# Patient Record
Sex: Male | Born: 1955 | Race: White | Hispanic: No | Smoking: Former smoker
Health system: Southern US, Community
[De-identification: ages and names within clinical notes are randomized; demographics above are authoritative.]

## PROBLEM LIST (undated history)

## (undated) DIAGNOSIS — I493 Ventricular premature depolarization: Secondary | ICD-10-CM

## (undated) DIAGNOSIS — I10 Essential (primary) hypertension: Secondary | ICD-10-CM

## (undated) DIAGNOSIS — J449 Chronic obstructive pulmonary disease, unspecified: Secondary | ICD-10-CM

## (undated) DIAGNOSIS — E785 Hyperlipidemia, unspecified: Secondary | ICD-10-CM

## (undated) DIAGNOSIS — M1990 Unspecified osteoarthritis, unspecified site: Secondary | ICD-10-CM

## (undated) DIAGNOSIS — N189 Chronic kidney disease, unspecified: Secondary | ICD-10-CM

## (undated) DIAGNOSIS — R0789 Other chest pain: Secondary | ICD-10-CM

## (undated) DIAGNOSIS — K219 Gastro-esophageal reflux disease without esophagitis: Secondary | ICD-10-CM

## (undated) DIAGNOSIS — Z720 Tobacco use: Secondary | ICD-10-CM

## (undated) DIAGNOSIS — R0600 Dyspnea, unspecified: Secondary | ICD-10-CM

## (undated) DIAGNOSIS — K2100 Gastro-esophageal reflux disease with esophagitis, without bleeding: Secondary | ICD-10-CM

## (undated) HISTORY — DX: Chronic kidney disease, unspecified: N18.9

## (undated) HISTORY — DX: Essential (primary) hypertension: I10

## (undated) HISTORY — DX: Gastro-esophageal reflux disease without esophagitis: K21.9

## (undated) HISTORY — DX: Unspecified osteoarthritis, unspecified site: M19.90

## (undated) HISTORY — DX: Dyspnea, unspecified: R06.00

## (undated) HISTORY — DX: Ventricular premature depolarization: I49.3

## (undated) HISTORY — DX: Tobacco use: Z72.0

## (undated) HISTORY — DX: Hyperlipidemia, unspecified: E78.5

## (undated) HISTORY — DX: Other chest pain: R07.89

## (undated) HISTORY — PX: OTHER SURGICAL HISTORY: SHX169

## (undated) HISTORY — DX: Gastro-esophageal reflux disease with esophagitis, without bleeding: K21.00

---

## 2002-01-18 ENCOUNTER — Emergency Department (HOSPITAL_COMMUNITY): Admission: EM | Admit: 2002-01-18 | Discharge: 2002-01-18 | Payer: Self-pay | Admitting: *Deleted

## 2002-01-18 ENCOUNTER — Encounter: Payer: Self-pay | Admitting: *Deleted

## 2006-08-26 ENCOUNTER — Emergency Department (HOSPITAL_COMMUNITY): Admission: EM | Admit: 2006-08-26 | Discharge: 2006-08-26 | Payer: Self-pay | Admitting: Emergency Medicine

## 2007-06-29 ENCOUNTER — Emergency Department (HOSPITAL_COMMUNITY): Admission: EM | Admit: 2007-06-29 | Discharge: 2007-06-29 | Payer: Self-pay | Admitting: Emergency Medicine

## 2011-01-12 ENCOUNTER — Encounter (INDEPENDENT_AMBULATORY_CARE_PROVIDER_SITE_OTHER): Payer: Self-pay | Admitting: *Deleted

## 2011-01-12 LAB — CONVERTED CEMR LAB
ALT: <8 units/L
AST: 15 units/L
Albumin: 4.3 g/dL
Alkaline Phosphatase: 76 units/L
BUN: 13 mg/dL
Basophils Absolute: 0 10*3/uL
Basophils Relative: 0 %
Bilirubin, Direct: 0.1 mg/dL
CO2: 30 meq/L
Calcium: 9.2 mg/dL
Chloride: 102 meq/L
Cholesterol: 207 mg/dL
Creatinine, Ser: 0.98 mg/dL
Eosinophils Absolute: 0.2 10*3/uL
Eosinophils Relative: 1 %
Glucose, Bld: 117 mg/dL
HCT: 92.9 %
HDL: 38 mg/dL
Hemoglobin: 48.5 g/dL
LDL Cholesterol: 21 mg/dL
Lymphocytes Relative: 27 %
Lymphs Abs: 3.3 10*3/uL
MCHC: 33 g/dL
MCV: 30.7 fL
Monocytes Absolute: 1 10*3/uL
Monocytes Relative: 8 %
Platelets: 274 10*3/uL
Potassium: 4.3 meq/L
RBC: 16 M/uL
RDW: 13 %
Sodium: 141 meq/L
Total Protein: 6.9 g/dL
Triglycerides: 106 mg/dL
WBC: 5.22 10*3/uL

## 2011-01-18 ENCOUNTER — Ambulatory Visit (HOSPITAL_COMMUNITY)
Admission: RE | Admit: 2011-01-18 | Discharge: 2011-01-18 | Payer: Self-pay | Source: Home / Self Care | Attending: Family Medicine | Admitting: Family Medicine

## 2011-01-26 ENCOUNTER — Encounter (INDEPENDENT_AMBULATORY_CARE_PROVIDER_SITE_OTHER): Payer: Self-pay | Admitting: *Deleted

## 2011-01-27 DIAGNOSIS — R002 Palpitations: Secondary | ICD-10-CM

## 2011-01-29 ENCOUNTER — Encounter (INDEPENDENT_AMBULATORY_CARE_PROVIDER_SITE_OTHER): Payer: Self-pay | Admitting: *Deleted

## 2011-01-29 ENCOUNTER — Encounter (HOSPITAL_COMMUNITY): Payer: Self-pay

## 2011-01-29 ENCOUNTER — Ambulatory Visit (HOSPITAL_COMMUNITY)
Admission: RE | Admit: 2011-01-29 | Discharge: 2011-01-29 | Disposition: A | Discharge status: Home / Self Care | Payer: Self-pay | Source: Ambulatory Visit | Attending: Cardiology | Admitting: Cardiology

## 2011-01-29 ENCOUNTER — Ambulatory Visit (INDEPENDENT_AMBULATORY_CARE_PROVIDER_SITE_OTHER): Payer: Self-pay | Admitting: Cardiology

## 2011-01-29 ENCOUNTER — Other Ambulatory Visit: Payer: Self-pay | Admitting: Cardiology

## 2011-01-29 ENCOUNTER — Encounter: Payer: Self-pay | Admitting: Cardiology

## 2011-01-29 ENCOUNTER — Other Ambulatory Visit (HOSPITAL_COMMUNITY): Payer: Self-pay

## 2011-01-29 DIAGNOSIS — R0602 Shortness of breath: Secondary | ICD-10-CM

## 2011-01-29 DIAGNOSIS — R079 Chest pain, unspecified: Secondary | ICD-10-CM

## 2011-01-29 DIAGNOSIS — R002 Palpitations: Secondary | ICD-10-CM

## 2011-01-29 DIAGNOSIS — R0609 Other forms of dyspnea: Secondary | ICD-10-CM | POA: Insufficient documentation

## 2011-01-29 DIAGNOSIS — J42 Unspecified chronic bronchitis: Secondary | ICD-10-CM | POA: Insufficient documentation

## 2011-01-29 DIAGNOSIS — F17200 Nicotine dependence, unspecified, uncomplicated: Secondary | ICD-10-CM

## 2011-01-29 DIAGNOSIS — E785 Hyperlipidemia, unspecified: Secondary | ICD-10-CM | POA: Insufficient documentation

## 2011-01-29 DIAGNOSIS — Z720 Tobacco use: Secondary | ICD-10-CM | POA: Insufficient documentation

## 2011-01-29 DIAGNOSIS — G43909 Migraine, unspecified, not intractable, without status migrainosus: Secondary | ICD-10-CM | POA: Insufficient documentation

## 2011-02-04 ENCOUNTER — Ambulatory Visit (HOSPITAL_COMMUNITY)
Admission: RE | Admit: 2011-02-04 | Discharge: 2011-02-04 | Disposition: A | Discharge status: Home / Self Care | Payer: Self-pay | Source: Ambulatory Visit | Attending: Cardiology | Admitting: Cardiology

## 2011-02-04 ENCOUNTER — Encounter: Payer: Self-pay | Admitting: Cardiology

## 2011-02-04 ENCOUNTER — Ambulatory Visit (HOSPITAL_COMMUNITY): Payer: Self-pay | Attending: Cardiology

## 2011-02-04 DIAGNOSIS — R0989 Other specified symptoms and signs involving the circulatory and respiratory systems: Secondary | ICD-10-CM | POA: Insufficient documentation

## 2011-02-04 DIAGNOSIS — R0609 Other forms of dyspnea: Secondary | ICD-10-CM | POA: Insufficient documentation

## 2011-02-04 DIAGNOSIS — F17200 Nicotine dependence, unspecified, uncomplicated: Secondary | ICD-10-CM | POA: Insufficient documentation

## 2011-02-04 DIAGNOSIS — R002 Palpitations: Secondary | ICD-10-CM | POA: Insufficient documentation

## 2011-02-04 DIAGNOSIS — R0789 Other chest pain: Secondary | ICD-10-CM | POA: Insufficient documentation

## 2011-02-04 DIAGNOSIS — R072 Precordial pain: Secondary | ICD-10-CM

## 2011-02-04 DIAGNOSIS — J42 Unspecified chronic bronchitis: Secondary | ICD-10-CM | POA: Insufficient documentation

## 2011-02-04 DIAGNOSIS — R079 Chest pain, unspecified: Secondary | ICD-10-CM | POA: Insufficient documentation

## 2011-02-04 DIAGNOSIS — I10 Essential (primary) hypertension: Secondary | ICD-10-CM | POA: Insufficient documentation

## 2011-02-04 LAB — BLOOD GAS, ARTERIAL
Acid-Base Excess: 2.4 mmol/L — ABNORMAL HIGH (ref 0.0–2.0)
Bicarbonate: 27 meq/L — ABNORMAL HIGH (ref 20.0–24.0)
O2 Saturation: 95.8 %
Patient temperature: 37
TCO2: 23.1 mmol/L (ref 0–100)
pCO2 arterial: 45.7 mmHg — ABNORMAL HIGH (ref 35.0–45.0)
pH, Arterial: 7.389 (ref 7.350–7.450)
pO2, Arterial: 75.5 mmHg — ABNORMAL LOW (ref 80.0–100.0)

## 2011-02-04 NOTE — Letter (Signed)
Summary: Stress Echocardiogram Information Sheet  [facility] [place]. [address], [place] [address]   Phone: [phone]  Fax: [phone]      January 29, 2011 MRN: [id number] light prior to the test.   [place] Distance  Doctor: Appointment Date: Appointment Time: Appointment Location: [facility]  Stress Echocardiogram Information Sheet    Instructions:   1. DO NOT  take your AM  medicine MORNING before the test.  2.NOTHING TO EAT OR DRINK AFTER MIDNIGHT  3. Dress prepared to exercise.  4. DO NOT use ANY caffine or tobacco products 3 hours before appointment.  5. Report to the [facility] on the1st floor.  6. Please bring all current prescription medications.  7. If you have any questions, please call [phone]

## 2011-02-05 ENCOUNTER — Encounter: Payer: Self-pay | Admitting: Cardiology

## 2011-02-08 ENCOUNTER — Encounter (INDEPENDENT_AMBULATORY_CARE_PROVIDER_SITE_OTHER): Payer: Self-pay | Admitting: *Deleted

## 2011-02-10 NOTE — Assessment & Plan Note (Signed)
Summary: NEW PT PER [facility] FOR IRREGULAR HR 40-260/ PVC'S ON EKG   Visit Type:  Initial Consult Primary Provider:  [facility] Dept.   History of Present Illness: Mr. Scott Hubbard is seen at the kind referral of the [facility] Dept. for evaluation of palpitations and pulse irregularities.  Patient has had these symptoms for some time, but has never previously been evaluated for them.  He has an application for disability pending for chronic obstructive pulmonary disease and exertional dyspnea.  He reports occasional mild to moderate left chest tightness that occurs unpredictably, does not radiate, is not dependent upon exercise and resolves spontaneously in minutes to at most one half hour.  He has not previously been evaluated by a cardiologist or undergone any significant cardiac testing.   Preventive Screening-Counseling & Management  Alcohol-Tobacco     Smoking Status: current  Current Medications (verified): 1)  None  Allergies (verified): No Known Drug Allergies  Past History:  Family History: Last updated: 02/25/11 Father died at age 55 as a result of alcohol abuse Mother died at age 57 with cardiac disease. Siblings: One sister who died at age 70 as the result of chronic obstructive pulmonary disease  Social History: Last updated: 02/25/2011 Tobacco Use - 40 pack years; one pack per day; patient has quit for 2 weeks at most in the past.  He feels as if he could not breathe properly in the morning until he smokes.  Alcohol-none Lifestyle-sedentary Employment-application for disability pending Marital-separated for years; 2 adult sons   Past Medical History: PVCs Hypertension Hyperlipidemia Tobacco abuse-40 pack years Chronic bronchitis Degenerative joint disease Migraine headache  Past Surgical History: None  EKG  Procedure date:  01/12/2011  Findings:      Normal sinus rhythm with PVCs in a pattern of bigeminy Left atrial  abnormality Minor nonspecific ST segment abnormality No previous tracing for comparison  Holter Monitor  Procedure date:  01/18/2011  Findings:      Normal sinus rhythm Sinus bradycardia with rates as low as 47 bpm Sinus tachycardia to 117 bpm Frequent PVCs: Average rate of 45 per hour Occasional PACs Multiple reports of dyspnea unrelated to any arrhythmias   Family History: Father died at age 13 as a result of alcohol abuse Mother died at age 20 with cardiac disease. Siblings: One sister who died at age 85 as the result of chronic obstructive pulmonary disease  Social History: Tobacco Use - 40 pack years; one pack per day; patient has quit for 2 weeks at most in the past.  He feels as if he could not breathe properly in the morning until he smokes.  Alcohol-none Lifestyle-sedentary Employment-application for disability pending Marital-separated for years; 2 adult sons   Review of Systems       Patient reports headaches and dizziness; he requires corrective lenses, has impaired hearing and has dentures.  He has been told of a heart murmur in the past.  He experiences some nausea in the mornings and has a history of gastroesophageal reflux disease.  He has chronic low back pain attributed to arthritis and had asthma as a child.  All other systems reviewed and are negative.  Vital Signs:  Patient profile:   55 year old male Height:      76 inches Weight:      168 pounds BMI:     20.52 O2 Sat:      96 % on Room air Pulse rate:   69 / minute BP  sitting:   135 / 88  (left arm)  Vitals Entered By: Tye Savoy RN (January 29, 2011 1:09 PM)  O2 Flow:  Room air  Physical Exam  General:  Thin; tall; well-developed; no acute distress: HEENT-[place]/AT; PERRL; EOM intact; conjunctiva and lids nl:  Neck-No JVD; no carotid bruits: Endocrine-No thyromegaly: Lungs-No tachypnea, clear without rales, rhonchi or wheezes; prolonged expiratory phase CV-normal PMI; normal S1 and S2:;    Abdomen-BS normal; soft and non-tender without masses or organomegaly: MS-No deformities, cyanosis or clubbing: Neurologic-Nl cranial nerves; symmetric strength and tone: Skin- Warm, no sig. lesions: Extremities-Nl distal pulses; no edema    Impression & Recommendations:  Problem # 1:  PVCS, FREQUENT (ICD-427.69) Patient describes frequent palpitations and that are most likely related to his PVCs; however, he has less frequent episodes of tachycardia palpitations that last for minutes to hours.  On one occasion, he was seen by a physician while having symptoms, who reported a heart rate in excess of 200 bpm.  Event recording will be undertaken in an effort to elucidate the nature of his arrhythmia, if any.  A recent Holter monitor study demonstrated frequent PVCs, infrequent PACs but no sustained arrhythmias.  Problem # 2:  HYPERLIPIDEMIA (P102836.4) Lipid values are suboptimal, but do not mandate pharmacologic therapy at the present time.  CHOL: 207 (01/12/2011)   LDL: 21 (01/12/2011)   HDL: 38 (01/12/2011)   TG: 106 (01/12/2011)  Problem # 3:  HYPERTENSION (ICD-401.1) Blood pressure is high normal without the need for pharmacologic therapy at present.  BP today: 135/88  Labs Reviewed: K+: 4.3 (01/12/2011) Creat: : 0.98 (01/12/2011)    Problem # 4:  TOBACCO ABUSE (ICD-305.1) Tobacco use is the most certain of her medical issues.  We have entered a referral to the [place] quit line for pharmacologic assistance and psychologic support in an attempt to discontinue cigarette smoking.  Problem # 5:  CHEST PAIN (ICD-786.50) A stress echocardiogram will be performed to evaluate left ventricular systolic function and to exclude exercise-induced myocardial ischemia.  Problem # 6:  DYSPNEA (ICD-786.05) Patient's disability attorney has requested PFTs, which will be performed along with an ABG.  A chest x-ray will also be obtained if no recent films are available.  Other  Orders: T-Chest x-ray, 2 views [id number]) Pulmonary Function Test (PFT) Stress Echo (Stress Echo) Cardionet/Event Monitor (Cardionet/Event) EKG w/ Interpretation (93000)  Patient Instructions: 1)  Your physician recommends that you schedule a follow-up appointment in: 1 MONTH 2)  A chest x-ray takes a picture of the organs and structures inside the chest, including the heart, lungs, and blood vessels. This test can show several things, including, whether the heart is enlarged; whether fluid is building up in the lungs; and whether pacemaker / defibrillator leads are still in place. 3)  Your physician has requested that you have a stress echocardiogram. For further information please visit [email or web address].  Please follow instruction sheet as given. 4)  Your physician has recommended that you wear an event monitor.  Event monitors are medical devices that record the heart's electrical activity. Doctors most often use these monitors to diagnose arrhythmias. Arrhythmias are problems with the speed or rhythm of the heartbeat. The monitor is a small, portable device. You can wear one while you do your normal daily activities. This is usually used to diagnose what is causing palpitations/syncope (passing out). 5)  Your physician has recommended that you have a pulmonary function test.  Pulmonary Function Tests are a group of tests that  measure how well air moves in and out of your lungs. 6)  Your physician discussed the hazards of tobacco use.  Tobacco use cessation is recommended and techniques and options to help you quit were discussed.

## 2011-02-15 ENCOUNTER — Telehealth (INDEPENDENT_AMBULATORY_CARE_PROVIDER_SITE_OTHER): Payer: Self-pay | Admitting: *Deleted

## 2011-02-16 NOTE — Letter (Signed)
Summary: [name] at [place]. [address] [place] [address]   Phone: [phone]  Fax: [phone]      February 08, 2011 MRN: [id number]   Scott Hubbard [place], [place]  [address]   Dear Mr. MEADERS,  Your test ordered by Rande Lawman has been reviewed by your physician (or physician assistant) and was found to be normal or stable. Your physician (or physician assistant) felt no changes were needed at this time.  ____ Echocardiogram  __x__ Cardiac Stress Test  ____ Lab Work  ____ Peripheral vascular study of arms, legs or neck  ____ CT scan or X-ray  ____ Lung or Breathing test  ____ Other:  No change in medical treatment at this time, per Dr. Lattie Haw.  Thank you, [name]    Jacqulyn Ducking, MD, Leana Gamer.C.Renella Cunas, MD, F.A.C.C Cristopher Peru, MD, F.A.C.C Rozann Lesches, MD, F.A.C.C Jenkins Rouge, MD, Leana Gamer.C.C

## 2011-02-16 NOTE — Miscellaneous (Signed)
    Exercise Stress Test  Procedure date:  02/04/2011  Findings:       CONCLUSION:   1. Abnormal, yet technically nondiagnostic exercise treadmill test,       peak heart rate only 71% of the age predicted maximum heart rate       response.  Study was limited by significant shortness of breath,       without report of chest pain.  Mild hypertensive response was       noted.  There were borderline abnormal ST-segment changes in leads       II, III, aVF, and V4-V6 in the setting of some lead motion       artifact.  Occasional to frequent PVCs were also noted, with some       ventricular bigeminy, however, no ventricular runs.   2. Nondiagnostic echocardiographic images with poor quality,       essentially not interpretable stress images.  Baseline LVEF appears       to be within the normal range, however, cannot exclude an element       of distal anteroseptal hypokinesis, rule out off axis imaging.  The       suboptimal stress images, however suggest similar       findings.   3. Would be suspicious of underlying ischemic heart disease based on       this suboptimal study, and would suggest further evaluation.               Satira Sark, MD               SGM/MEDQ  D:  02/04/2011  T:  02/04/2011  Job:  [id number]    Appended Document:  results letter mailed to pt

## 2011-02-17 ENCOUNTER — Encounter: Payer: Self-pay | Admitting: Cardiology

## 2011-02-25 ENCOUNTER — Telehealth: Payer: Self-pay | Admitting: *Deleted

## 2011-02-25 ENCOUNTER — Encounter (INDEPENDENT_AMBULATORY_CARE_PROVIDER_SITE_OTHER): Payer: Self-pay | Admitting: *Deleted

## 2011-02-25 ENCOUNTER — Encounter: Payer: Self-pay | Admitting: *Deleted

## 2011-02-25 NOTE — Progress Notes (Signed)
Summary: CHECKING ON HEART MONITOR  Phone Note Call from Patient Call back at Home Phone [phone]   Caller: PT Reason for Call: Talk to Nurse Summary of Call: PT HAS NOT RECIEVED HIS [place] IT Initial call taken by: Nevada Crane,  February 15, 2011 1:02 PM  Follow-up for Phone Call        sent request for monitor, pt has no insurance, also sent request for [employer] indigent patient papers to be sent for assist for cost Follow-up by: Tye Savoy RN,  February 16, 2011 11:12 AM

## 2011-03-01 ENCOUNTER — Ambulatory Visit: Payer: Self-pay | Admitting: Cardiology

## 2011-03-02 NOTE — Miscellaneous (Addendum)
Summary: PFT transcribed report  Clinical Lists Changes  Observations: Added new observation of PFT RSLT:      PULMONARY FUNCTION TEST         REASON FOR PULMONARY FUNCTION TESTING:  Chronic bronchitis.      1. Spirometry shows a severe ventilatory defect with airflow       obstruction.   2. Lung volumes show marked air-trapping.   3. DLCO is normal.   4. Arterial blood gas is normal.   5. There is significant bronchodilator improvement.   6. This study is consistent with COPD.               Edward L. [name], M.D.              (02/05/2011 10:27) Added new observation of ETTECHOFIND:  CONCLUSION:   1. Abnormal, yet technically nondiagnostic exercise treadmill test,       peak heart rate only 71% of the age predicted maximum heart rate       response.  Study was limited by significant shortness of breath,       without report of chest pain.  Mild hypertensive response was       noted.  There were borderline abnormal ST-segment changes in leads       II, III, aVF, and V4-V6 in the setting of some lead motion       artifact.  Occasional to frequent PVCs were also noted, with some       ventricular bigeminy, however, no ventricular runs.   2. Nondiagnostic echocardiographic images with poor quality,       essentially not interpretable stress images.  Baseline LVEF appears       to be within the normal range, however, cannot exclude an element       of distal anteroseptal hypokinesis, rule out off axis imaging.  The       suboptimal stress images, however suggest similar       findings.   3. Would be suspicious of underlying ischemic heart disease based on       this suboptimal study, and would suggest further evaluation.               Satira Sark, MD        (02/04/2011 10:26) Added new observation of CXR RESULTS:  Clinical Data: Chronic bronchitis    CHEST - 2 VIEW    Comparison: None.    Findings: Changes of COPD with marked hyperinflation is noted.  Taking this into consideration, heart size is within normal limits.   The mediastinal  contour is within normal limits.  The lung fields   are clear with no signs of focal infiltrate or congestive failure.   No pleural fluid.  Some increase in central bronchial thickening is   identified and would correlate with history of chronic bronchitis.   Mild bilateral apical pleural thickening is evident.    Bony structures appear intact.    IMPRESSION:   COPD with prominent hyperinflation and central bronchitic change.   Otherwise clear lungs    Original Report Authenticated By: Ander Gaster, M.D.  Additional Information  HL7 RESULT STATUS : F (01/29/2011 10:27)      Stress Echocardiogram  Procedure date:  02/04/2011  Findings:       CONCLUSION:   1. Abnormal, yet technically nondiagnostic exercise treadmill test,       peak heart rate only 71% of the age predicted maximum heart rate  response.  Study was limited by significant shortness of breath,       without report of chest pain.  Mild hypertensive response was       noted.  There were borderline abnormal ST-segment changes in leads       II, III, aVF, and V4-V6 in the setting of some lead motion       artifact.  Occasional to frequent PVCs were also noted, with some       ventricular bigeminy, however, no ventricular runs.   2. Nondiagnostic echocardiographic images with poor quality,       essentially not interpretable stress images.  Baseline LVEF appears       to be within the normal range, however, cannot exclude an element       of distal anteroseptal hypokinesis, rule out off axis imaging.  The       suboptimal stress images, however suggest similar       findings.   3. Would be suspicious of underlying ischemic heart disease based on       this suboptimal study, and would suggest further evaluation.               Satira Sark, MD         PFT  Procedure date:  02/05/2011  Findings:            PULMONARY FUNCTION TEST         REASON FOR PULMONARY FUNCTION TESTING:  Chronic bronchitis.      1. Spirometry shows a severe ventilatory defect with airflow       obstruction.   2. Lung volumes show marked air-trapping.   3. DLCO is normal.   4. Arterial blood gas is normal.   5. There is significant bronchodilator improvement.   6. This study is consistent with COPD.               Edward L. [name], M.D.               CXR  Procedure date:  01/29/2011  Findings:       Clinical Data: Chronic bronchitis    CHEST - 2 VIEW    Comparison: None.    Findings: Changes of COPD with marked hyperinflation is noted.   Taking this into consideration, heart size is within normal limits.   The mediastinal  contour is within normal limits.  The lung fields   are clear with no signs of focal infiltrate or congestive failure.   No pleural fluid.  Some increase in central bronchial thickening is   identified and would correlate with history of chronic bronchitis.   Mild bilateral apical pleural thickening is evident.    Bony structures appear intact.    IMPRESSION:   COPD with prominent hyperinflation and central bronchitic change.   Otherwise clear lungs    Original Report Authenticated By: Ander Gaster, M.D.  Additional Information  HL7 RESULT STATUS : F

## 2011-03-09 NOTE — Progress Notes (Signed)
Summary: Results  Phone Note Call from Patient   Caller: Patient Reason for Call: Lab or Test Results Summary of Call: would like results of "tests done last week" / tg Initial call taken by: Alphonsus Sias [facility],  February 25, 2011 2:39 PM  Follow-up for Phone Call        Pt is wanting to know if he needs to see a pulmonologist for his Lung function?, from his PFT's, or if this would help him get his disability Follow-up by: Tye Savoy RN,  February 25, 2011 2:47 PM  Additional Follow-up for Phone Call Additional follow up Details #1::        All issues will be discussed at his next office visit.   Additional Follow-up by: Yehuda Savannah, MD, [facility],  March 01, 2011 10:29 AM    Additional Follow-up for Phone Call Additional follow up Details #2::    notified pt that Dr will discussed PFT in detail at office visit Follow-up by: Tye Savoy RN,  March 01, 2011 10:39 AM

## 2011-03-24 ENCOUNTER — Encounter: Payer: Self-pay | Admitting: Cardiology

## 2011-03-24 ENCOUNTER — Encounter: Payer: Self-pay | Admitting: *Deleted

## 2011-03-24 ENCOUNTER — Ambulatory Visit (INDEPENDENT_AMBULATORY_CARE_PROVIDER_SITE_OTHER): Payer: Self-pay | Admitting: Cardiology

## 2011-03-24 DIAGNOSIS — F17200 Nicotine dependence, unspecified, uncomplicated: Secondary | ICD-10-CM

## 2011-03-24 DIAGNOSIS — I2510 Atherosclerotic heart disease of native coronary artery without angina pectoris: Secondary | ICD-10-CM

## 2011-03-24 DIAGNOSIS — E785 Hyperlipidemia, unspecified: Secondary | ICD-10-CM

## 2011-03-24 DIAGNOSIS — I1310 Hypertensive heart and chronic kidney disease without heart failure, with stage 1 through stage 4 chronic kidney disease, or unspecified chronic kidney disease: Secondary | ICD-10-CM

## 2011-03-24 DIAGNOSIS — R079 Chest pain, unspecified: Secondary | ICD-10-CM

## 2011-03-24 MED ORDER — NITROGLYCERIN 0.4 MG SL SUBL: 0.4000 mg | SUBLINGUAL_TABLET | SUBLINGUAL | Status: DC | PRN

## 2011-03-24 NOTE — Progress Notes (Signed)
HPI : Patient returns to the office after a partially successful attempt to perform necessary testing.  A stress echocardiogram showed better-than-expected exercise capacity, but failure to reach an adequate heart rate and poor quality images.  There was no electrocardiographic evidence for ischemia.  Overall, the test was considered nondiagnostic.  PFTs were performed and showed severe obstructive disease with minimal CO2 retention.  Paperwork has not yet been completed for an event recorder.  Chest x-ray shows scarring consistent with chronic obstructive pulmonary disease.  He has continued to note occasional, brief episodes of palpitations as well as chest tightness and dyspnea with minor exertion, which he attributes to chronic obstructive pulmonary disease.  No current outpatient prescriptions on file prior to visit.     No Known Allergies    Past medical history, social history, and family history reviewed and updated.  ROS:  No orthopnea nor PND.  Wheezing is audible at times.  Chronic cough with sputum production.  PHYSICAL EXAM: BP 125/83  Pulse 66  Ht 6\' 4"  (1.93 m)  Wt 167 lb (75.751 kg)  BMI 20.33 kg/m2  SpO2 95%  General-Well developed; no acute distress Body habitus-proportionate weight and height Neck-No JVD; no carotid bruits Lungs-Prolonged expiratory phase; moderate expiratory wheezing; inspiratory and expiratory rhonchi Cardiovascular-normal PMI; normal S1 and S2; modest rhythm irregularity Abdomen-normal bowel sounds; soft and non-tender without masses or organomegaly Musculoskeletal-No deformities, no cyanosis or clubbing Neurologic-Normal cranial nerves; symmetric strength and tone Skin-Warm, no significant lesions Extremities-distal pulses intact; no edema  ASSESSMENT AND PLAN:

## 2011-03-24 NOTE — Assessment & Plan Note (Signed)
Previous lipid profile shows a mild to moderate elevation in the LDL, which does not require pharmacologic therapy unless vascular disease is proven.

## 2011-03-24 NOTE — Patient Instructions (Signed)
Your physician recommends that you schedule a follow-up appointment in:2 months  Your physician has requested that you have cardiac CT. Cardiac computed tomography (CT) is a painless test that uses an x-ray machine to take clear, detailed pictures of your heart. For further information please visit [email or web address]. Please follow instruction sheet as given.   Your physician has recommended you make the following change in your medication: nitroglycerin 0.4mg  under tongue every 5 minutes x3 for chest pain then go to ED

## 2011-03-24 NOTE — Assessment & Plan Note (Signed)
Chest discomfort remains atypical, but certainly could represent myocardial ischemia.  I doubt that noninvasive testing will be diagnostic; however, patient is concerned about the possible complications of catheterization, and does not wish to proceed with that test.  A coronary CT will be performed to allow risk stratification and a determination as to whether cardiac catheterization is absolutely necessary.

## 2011-03-24 NOTE — Assessment & Plan Note (Signed)
Patient is not interested in considering smoking cessation.  I offered treatment for chronic obstructive pulmonary disease.  He is convinced that inhalers have caused chest discomfort in the past.  He cannot afford Singulair.  He will likely not be able to obtain a nebulizer without insurance.  Claim for Medicaid has been submitted as well as disability.  For now, he will be given sublingual nitroglycerin to try for chest pressure.

## 2011-03-25 LAB — BASIC METABOLIC PANEL
Chloride: 103 mEq/L (ref 96–112)
Potassium: 4.7 mEq/L (ref 3.5–5.3)
Sodium: 139 mEq/L (ref 135–145)

## 2011-03-29 ENCOUNTER — Telehealth: Payer: Self-pay | Admitting: *Deleted

## 2011-03-29 NOTE — Telephone Encounter (Signed)
Message copied by Tye Savoy on Mon Mar 29, 2011  9:15 AM ------      Message from: Jacqulyn Ducking      Created: Sat Mar 27, 2011  8:40 PM       Lab results reviewed.      No change in Rx.

## 2011-04-06 ENCOUNTER — Encounter: Payer: Self-pay | Admitting: Cardiology

## 2011-04-06 ENCOUNTER — Encounter: Payer: Self-pay | Admitting: Cardiovascular Disease

## 2011-04-22 ENCOUNTER — Telehealth: Payer: Self-pay | Admitting: Cardiology

## 2011-04-22 NOTE — Telephone Encounter (Signed)
Scott Hubbard is scheduled for a CTA with Dr. Johnsie Cancel on 04/23/11 @ 1pm. I call to confirmed his appt. He has decided to cancel his CTA. Scott Hubbard would not give a reason as to why. Can you let Dr. Lattie Haw know.

## 2011-04-23 ENCOUNTER — Inpatient Hospital Stay (HOSPITAL_COMMUNITY): Admission: RE | Admit: 2011-04-23 | Payer: Self-pay | Source: Ambulatory Visit

## 2011-04-23 ENCOUNTER — Encounter: Payer: Self-pay | Admitting: *Deleted

## 2011-04-23 NOTE — Telephone Encounter (Signed)
Pt is unable to pay for procedure. I sent him information on the [employer]

## 2011-05-20 DIAGNOSIS — I493 Ventricular premature depolarization: Secondary | ICD-10-CM | POA: Insufficient documentation

## 2011-05-20 DIAGNOSIS — M1990 Unspecified osteoarthritis, unspecified site: Secondary | ICD-10-CM

## 2011-05-20 DIAGNOSIS — I10 Essential (primary) hypertension: Secondary | ICD-10-CM

## 2011-05-24 ENCOUNTER — Ambulatory Visit: Payer: Self-pay | Admitting: Cardiology

## 2012-06-15 IMAGING — CR DG CHEST 2V
3 series · 3 of 3 positions shown · non-contrast
Comparison: None.

CLINICAL DATA: Chronic bronchitis

CHEST - 2 VIEW

[view not recorded (1 of 3)]
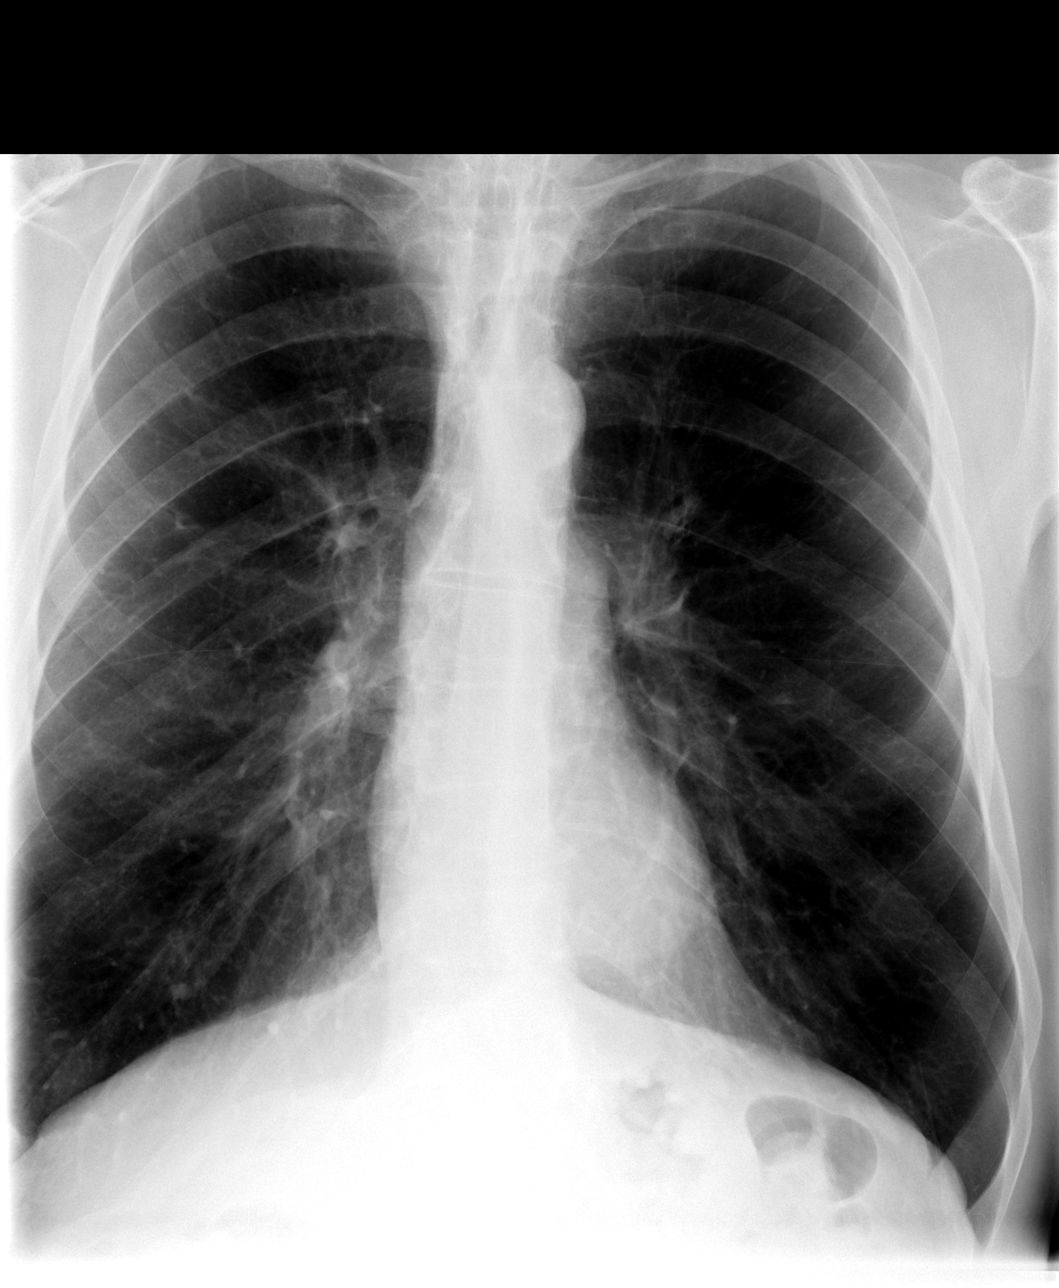

[view not recorded (2 of 3)]
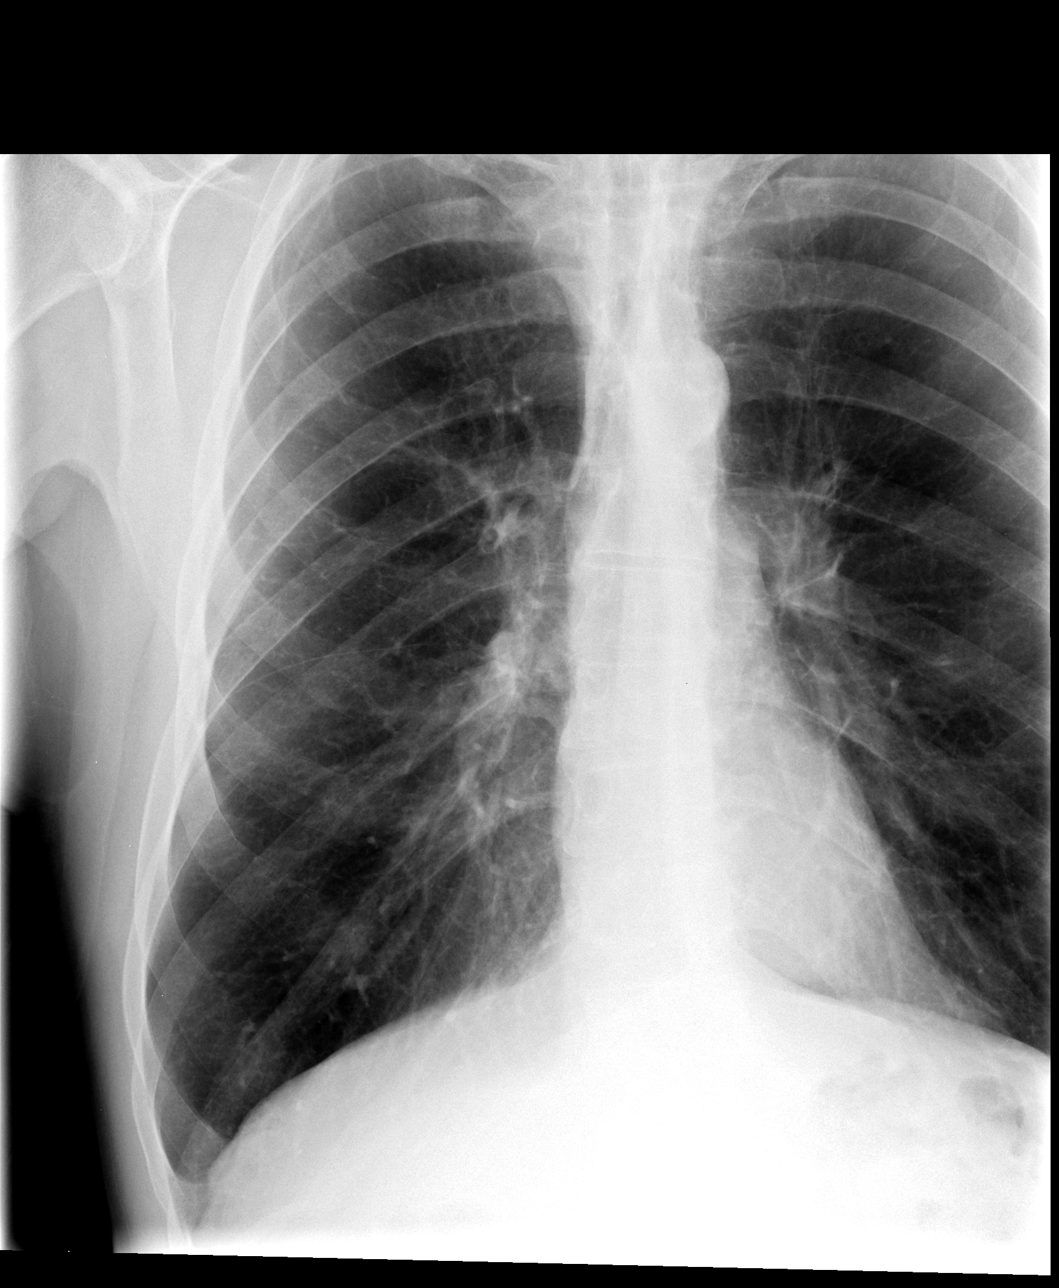

[view not recorded (3 of 3)]
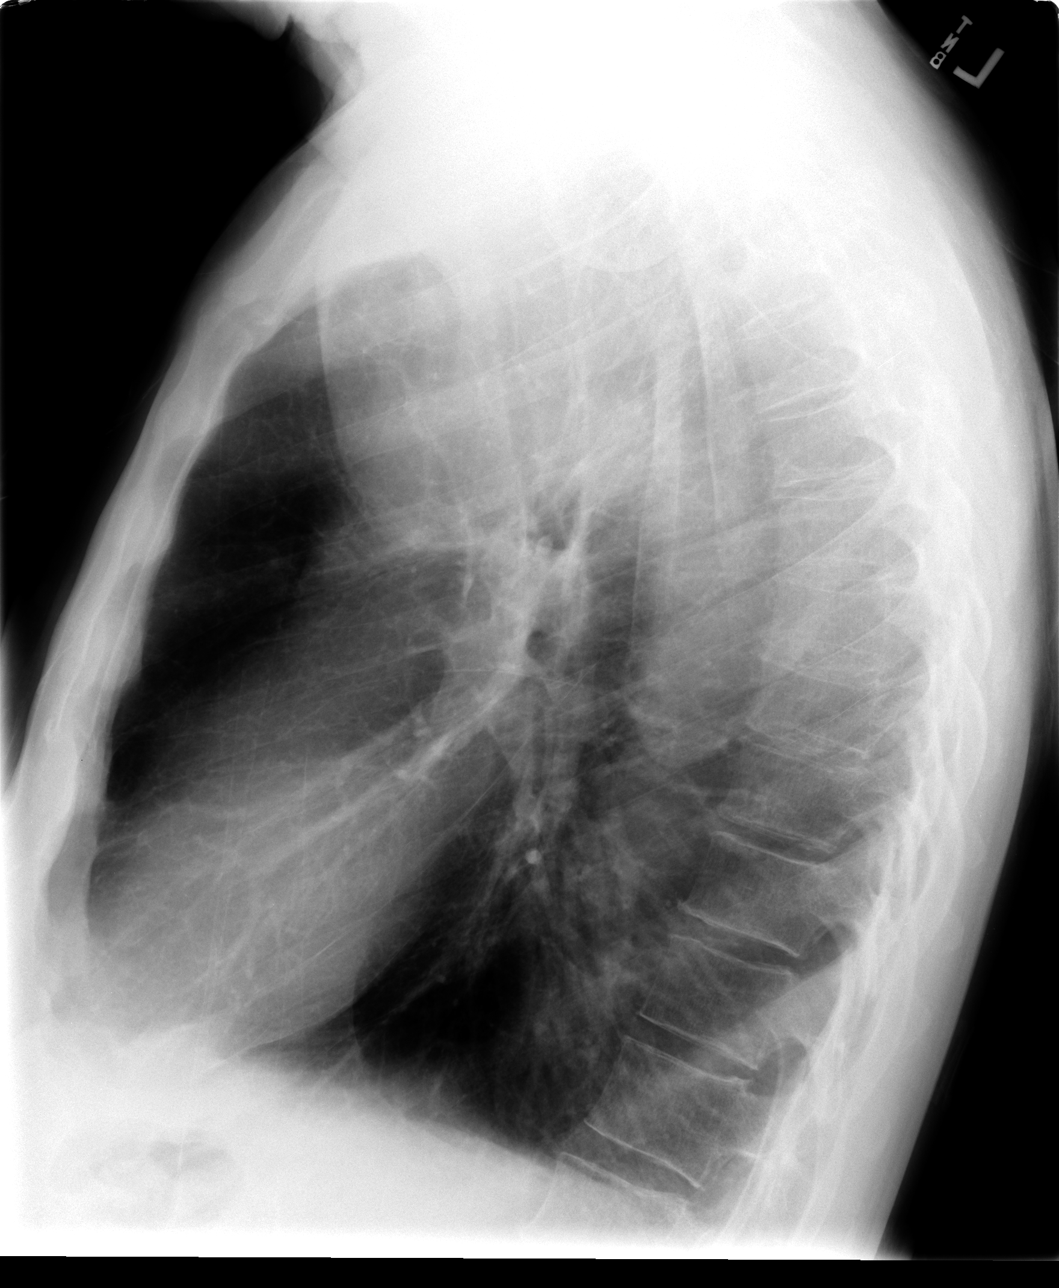

[3 of 3 positions shown; findings below may reference images not displayed]

FINDINGS: Changes of COPD with marked hyperinflation is noted.
Taking this into consideration, heart size is within normal limits.
The mediastinal  contour is within normal limits.  The lung fields
are clear with no signs of focal infiltrate or congestive failure.
No pleural fluid.  Some increase in central bronchial thickening is
identified and would correlate with history of chronic bronchitis.
Mild bilateral apical pleural thickening is evident.

Bony structures appear intact.
IMPRESSION: COPD with prominent hyperinflation and central bronchitic change.
Otherwise clear lungs

## 2015-08-08 ENCOUNTER — Telehealth: Payer: Self-pay | Admitting: General Practice

## 2016-04-20 ENCOUNTER — Encounter (HOSPITAL_COMMUNITY): Payer: Self-pay

## 2016-04-20 ENCOUNTER — Emergency Department (HOSPITAL_COMMUNITY)
Admission: EM | Admit: 2016-04-20 | Discharge: 2016-04-20 | Disposition: A | Discharge status: Home / Self Care | Payer: Medicaid Other | Attending: Emergency Medicine | Admitting: Emergency Medicine

## 2016-04-20 ENCOUNTER — Emergency Department (HOSPITAL_COMMUNITY): Payer: Medicaid Other

## 2016-04-20 DIAGNOSIS — E785 Hyperlipidemia, unspecified: Secondary | ICD-10-CM | POA: Insufficient documentation

## 2016-04-20 DIAGNOSIS — I10 Essential (primary) hypertension: Secondary | ICD-10-CM | POA: Insufficient documentation

## 2016-04-20 DIAGNOSIS — J441 Chronic obstructive pulmonary disease with (acute) exacerbation: Secondary | ICD-10-CM | POA: Diagnosis not present

## 2016-04-20 DIAGNOSIS — F17200 Nicotine dependence, unspecified, uncomplicated: Secondary | ICD-10-CM | POA: Insufficient documentation

## 2016-04-20 DIAGNOSIS — R0602 Shortness of breath: Secondary | ICD-10-CM | POA: Diagnosis present

## 2016-04-20 LAB — CBC WITH DIFFERENTIAL/PLATELET
BASOS ABS: 0 10*3/uL (ref 0.0–0.1)
Basophils Relative: 0 %
EOS PCT: 2 %
Eosinophils Absolute: 0.2 10*3/uL (ref 0.0–0.7)
HEMATOCRIT: 46.4 % (ref 39.0–52.0)
Hemoglobin: 15.5 g/dL (ref 13.0–17.0)
LYMPHS PCT: 18 %
Lymphs Abs: 1.8 10*3/uL (ref 0.7–4.0)
MCH: 30 pg (ref 26.0–34.0)
MCHC: 33.4 g/dL (ref 30.0–36.0)
MCV: 89.7 fL (ref 78.0–100.0)
MONO ABS: 0.8 10*3/uL (ref 0.1–1.0)
MONOS PCT: 8 %
NEUTROS ABS: 7.3 10*3/uL (ref 1.7–7.7)
Neutrophils Relative %: 72 %
PLATELETS: 215 10*3/uL (ref 150–400)
RBC: 5.17 MIL/uL (ref 4.22–5.81)
RDW: 12.7 % (ref 11.5–15.5)
WBC: 10.2 10*3/uL (ref 4.0–10.5)

## 2016-04-20 LAB — BASIC METABOLIC PANEL
Anion gap: 6 (ref 5–15)
BUN: 11 mg/dL (ref 6–20)
CALCIUM: 8.6 mg/dL — AB (ref 8.9–10.3)
CO2: 31 mmol/L (ref 22–32)
CREATININE: 0.86 mg/dL (ref 0.61–1.24)
Chloride: 102 mmol/L (ref 101–111)
GFR calc Af Amer: >60 mL/min (ref >60)
GLUCOSE: 109 mg/dL — AB (ref 65–99)
Potassium: 3.8 mmol/L (ref 3.5–5.1)
Sodium: 139 mmol/L (ref 135–145)

## 2016-04-20 MED ORDER — ALBUTEROL (5 MG/ML) CONTINUOUS INHALATION SOLN
10.0000 mg/h | INHALATION_SOLUTION | Freq: Once | RESPIRATORY_TRACT | Status: AC
  Administered 2016-04-20: 10 mg/h via RESPIRATORY_TRACT
  Filled 2016-04-20: qty 20

## 2016-04-20 MED ORDER — DOXYCYCLINE HYCLATE 100 MG PO CAPS
100.0000 mg | ORAL_CAPSULE | Freq: Two times a day (BID) | ORAL | Status: DC
Start: 2016-04-20 — End: 2018-10-03

## 2016-04-20 MED ORDER — ALBUTEROL SULFATE HFA 108 (90 BASE) MCG/ACT IN AERS
2.0000 | INHALATION_SPRAY | Freq: Once | RESPIRATORY_TRACT | Status: AC
  Administered 2016-04-20: 2 via RESPIRATORY_TRACT
  Filled 2016-04-20: qty 6.7

## 2016-04-20 MED ORDER — PREDNISONE 10 MG PO TABS
60.0000 mg | ORAL_TABLET | Freq: Once | ORAL | Status: AC
  Administered 2016-04-20: 60 mg via ORAL
  Filled 2016-04-20: qty 1

## 2016-04-20 MED ORDER — PREDNISONE 20 MG PO TABS: ORAL_TABLET | ORAL | Status: DC

## 2016-04-20 NOTE — ED Notes (Signed)
Pt c/o sob x 1 year but worse today than usual.  Denies cough or fever.

## 2016-04-20 NOTE — Discharge Instructions (Signed)

## 2016-04-20 NOTE — ED Notes (Signed)
Pt ambulated around the nurse's station x 2. Pulse oximetry between 96 and 99%.

## 2016-04-20 NOTE — ED Provider Notes (Signed)
CSN: [id number]     Arrival date & time 04/20/16  1132 History   First MD Initiated Contact with Patient 04/20/16 1148     Chief Complaint  Patient presents with  . Shortness of Breath     (Consider location/radiation/quality/duration/timing/severity/associated sxs/prior Treatment) HPI  Scott Hubbard is a 60 y.o. male who presents to the Emergency Department complaining of shortness of breath that's been persistent for one year, symptoms worsening for 2 weeks.  He states the symptoms are worse with exertion.  He complains of occasional cough which is non-productive.  Nothing makes it better.  He denies chest pain, fever, vomiting, hemoptysis, rash or abdominal pain. States he has hx of COPD, but does not have a PCP.  Past Medical History  Diagnosis Date  . PVC (premature ventricular contraction)     Frequent  . Hypertension   . Hyperlipidemia   . Tobacco abuse   . Chronic bronchitis   . DJD (degenerative joint disease)   . Migraine   . Tobacco abuse     40 pack years  . Chest discomfort     negative stress echocardiogram-01/2011  . Dyspnea    Past Surgical History  Procedure Laterality Date  . None     No family history on file. Social History  Substance Use Topics  . Smoking status: Current Every Day Smoker  . Smokeless tobacco: Never Used  . Alcohol Use: No    Review of Systems  Constitutional: Negative for fever, chills and appetite change.  HENT: Positive for congestion. Negative for sore throat and trouble swallowing.   Respiratory: Positive for cough and chest tightness. Negative for shortness of breath and wheezing.   Cardiovascular: Negative for chest pain.  Gastrointestinal: Negative for nausea, vomiting and abdominal pain.  Genitourinary: Negative for dysuria.  Musculoskeletal: Negative for arthralgias.  Skin: Negative for rash.  Neurological: Negative for dizziness, weakness and numbness.  Hematological: Negative for adenopathy.  All other systems  reviewed and are negative.     Allergies  Review of patient's allergies indicates no known allergies.  Home Medications   Prior to Admission medications   Medication Sig Start Date End Date Taking? Authorizing Provider  naproxen sodium (ANAPROX) 220 MG tablet Take 220 mg by mouth as needed.      Historical Provider, MD  nitroGLYCERIN (NITROSTAT) 0.4 MG SL tablet Place 1 tablet (0.4 mg total) under the tongue every 5 (five) minutes as needed for chest pain. 03/24/11   Yehuda Savannah, MD   BP 171/96 mmHg  Pulse 63  Temp(Src) 98.2 F (36.8 C) (Temporal)  Resp 24  Ht 6\' 5"  (1.956 m)  Wt 77.111 kg  BMI 20.15 kg/m2  SpO2 97% Physical Exam  Constitutional: He is oriented to person, place, and time. He appears well-developed and well-nourished. No distress.  HENT:  Head: Normocephalic and atraumatic.  Right Ear: Tympanic membrane and ear canal normal.  Left Ear: Tympanic membrane and ear canal normal.  Mouth/Throat: Uvula is midline, oropharynx is clear and moist and mucous membranes are normal. No oropharyngeal exudate.  Eyes: EOM are normal. Pupils are equal, round, and reactive to light.  Neck: Normal range of motion, full passive range of motion without pain and phonation normal. Neck supple.  Cardiovascular: Normal rate, regular rhythm, normal heart sounds and intact distal pulses.   No murmur heard. Pulmonary/Chest: Effort normal. No stridor. No respiratory distress. He has wheezes. He has no rales. He exhibits no tenderness.  Coarse lungs sounds bilaterally with  inspiratory and expiratory wheezes.  Breathing through pursed lips.  Abdominal: Soft. There is no tenderness.  Musculoskeletal: He exhibits no edema.  Lymphadenopathy:    He has no cervical adenopathy.  Neurological: He is alert and oriented to person, place, and time. He exhibits normal muscle tone. Coordination normal.  Skin: Skin is warm and dry.  Nursing note and vitals reviewed.   ED Course  Procedures  (including critical care time) Labs Review Labs Reviewed  BASIC METABOLIC PANEL - Abnormal; Notable for the following:    Glucose, Bld 109 (*)    Calcium 8.6 (*)    All other components within normal limits  CBC WITH DIFFERENTIAL/PLATELET    Imaging Review Dg Chest 2 View  04/20/2016  CLINICAL DATA:  Shortness of breath for 1 year, progressing EXAM: CHEST  2 VIEW COMPARISON:  January 29, 2011 FINDINGS: Lungs remain hyperexpanded. There is slight scarring in the left base. There is no edema or consolidation. The heart size is normal. The pulmonary vascularity is stable and appears to reflect a degree of underlying emphysematous change with likely bullous disease in the upper lobes given relative decreased vascularity to the upper lobes. No adenopathy. No bone lesions. IMPRESSION: Evidence of a degree of underlying emphysematous change. Mild scarring left base. No edema or consolidation. Electronically Signed   By: Lowella Grip III M.D.   On: 04/20/2016 11:55   I have personally reviewed and evaluated these images and lab results as part of my medical decision-making.     ED ECG REPORT   Date: 04/20/2016  Rate: 72  Rhythm: normal sinus rhythm  QRS Axis: normal  Intervals: normal  ST/T Wave abnormalities: normal and nonspecific ST changes  Conduction Disutrbances:none  Narrative Interpretation:   Old EKG Reviewed: none available   EKG reviewed by Dr. Nat Christen.  No old EKG's available for comparison   MDM   Final diagnoses:  COPD exacerbation (HCC)   Labored breathing, continuous albuterol neb ordered, prednisone.   Pt also seen by Dr. Lacinda Axon, care plan discussed.  On recheck, pt is feeling better after neb.  Lung sounds improved.  No further labored breathing.  Vitals stable.    Pt ambulated in the dept without hypoxia, tachycardia, or distress.  Appears stable for discharge and strict return precautions given.  Referral info also given for primary care.        Kem Parkinson, PA-C 04/21/16 2155  Nat Christen, MD 04/22/16 [phone]

## 2016-04-28 ENCOUNTER — Emergency Department (HOSPITAL_COMMUNITY): Payer: Medicaid Other

## 2016-04-28 ENCOUNTER — Emergency Department (HOSPITAL_COMMUNITY)
Admission: EM | Admit: 2016-04-28 | Discharge: 2016-04-28 | Disposition: A | Discharge status: Home / Self Care | Payer: Medicaid Other | Attending: Emergency Medicine | Admitting: Emergency Medicine

## 2016-04-28 ENCOUNTER — Encounter (HOSPITAL_COMMUNITY): Payer: Self-pay

## 2016-04-28 DIAGNOSIS — E785 Hyperlipidemia, unspecified: Secondary | ICD-10-CM | POA: Insufficient documentation

## 2016-04-28 DIAGNOSIS — J9801 Acute bronchospasm: Secondary | ICD-10-CM | POA: Diagnosis not present

## 2016-04-28 DIAGNOSIS — J42 Unspecified chronic bronchitis: Secondary | ICD-10-CM

## 2016-04-28 DIAGNOSIS — Z79899 Other long term (current) drug therapy: Secondary | ICD-10-CM | POA: Insufficient documentation

## 2016-04-28 DIAGNOSIS — R0602 Shortness of breath: Secondary | ICD-10-CM | POA: Diagnosis present

## 2016-04-28 DIAGNOSIS — F17200 Nicotine dependence, unspecified, uncomplicated: Secondary | ICD-10-CM | POA: Diagnosis not present

## 2016-04-28 DIAGNOSIS — I10 Essential (primary) hypertension: Secondary | ICD-10-CM | POA: Insufficient documentation

## 2016-04-28 LAB — BASIC METABOLIC PANEL
Anion gap: 6 (ref 5–15)
BUN: 16 mg/dL (ref 6–20)
CHLORIDE: 99 mmol/L — AB (ref 101–111)
CO2: 31 mmol/L (ref 22–32)
CREATININE: 0.89 mg/dL (ref 0.61–1.24)
Calcium: 8.4 mg/dL — ABNORMAL LOW (ref 8.9–10.3)
GFR calc non Af Amer: >60 mL/min (ref >60)
GLUCOSE: 110 mg/dL — AB (ref 65–99)
Potassium: 4 mmol/L (ref 3.5–5.1)
Sodium: 136 mmol/L (ref 135–145)

## 2016-04-28 LAB — CBC WITH DIFFERENTIAL/PLATELET
BASOS PCT: 0 %
Basophils Absolute: 0 10*3/uL (ref 0.0–0.1)
Eosinophils Absolute: 0.1 10*3/uL (ref 0.0–0.7)
Eosinophils Relative: 1 %
HEMATOCRIT: 42.9 % (ref 39.0–52.0)
HEMOGLOBIN: 14 g/dL (ref 13.0–17.0)
LYMPHS ABS: 2.1 10*3/uL (ref 0.7–4.0)
LYMPHS PCT: 20 %
MCH: 29.5 pg (ref 26.0–34.0)
MCHC: 32.6 g/dL (ref 30.0–36.0)
MCV: 90.3 fL (ref 78.0–100.0)
MONO ABS: 0.9 10*3/uL (ref 0.1–1.0)
MONOS PCT: 8 %
NEUTROS ABS: 7.4 10*3/uL (ref 1.7–7.7)
NEUTROS PCT: 71 %
Platelets: 242 10*3/uL (ref 150–400)
RBC: 4.75 MIL/uL (ref 4.22–5.81)
RDW: 13 % (ref 11.5–15.5)
WBC: 10.4 10*3/uL (ref 4.0–10.5)

## 2016-04-28 LAB — TROPONIN I: Troponin I: <0.03 ng/mL (ref <0.031)

## 2016-04-28 MED ORDER — ALBUTEROL (5 MG/ML) CONTINUOUS INHALATION SOLN
10.0000 mg/h | INHALATION_SOLUTION | Freq: Once | RESPIRATORY_TRACT | Status: AC
  Administered 2016-04-28: 10 mg/h via RESPIRATORY_TRACT
  Filled 2016-04-28: qty 20

## 2016-04-28 MED ORDER — PREDNISONE 20 MG PO TABS: 40.0000 mg | ORAL_TABLET | Freq: Every day | ORAL | Status: DC

## 2016-04-28 MED ORDER — ALBUTEROL SULFATE HFA 108 (90 BASE) MCG/ACT IN AERS: 2.0000 | INHALATION_SPRAY | RESPIRATORY_TRACT | Status: DC | PRN

## 2016-04-28 MED ORDER — METHYLPREDNISOLONE SODIUM SUCC 125 MG IJ SOLR
125.0000 mg | Freq: Once | INTRAMUSCULAR | Status: AC
Start: 2016-04-28 — End: 2016-04-28
  Administered 2016-04-28: 125 mg via INTRAVENOUS
  Filled 2016-04-28: qty 2

## 2016-04-28 MED ORDER — IPRATROPIUM BROMIDE 0.02 % IN SOLN
1.0000 mg | Freq: Once | RESPIRATORY_TRACT | Status: AC
  Administered 2016-04-28: 1 mg via RESPIRATORY_TRACT
  Filled 2016-04-28: qty 5

## 2016-04-28 NOTE — ED Provider Notes (Signed)
CSN: [id number]     Arrival date & time 04/28/16  1243 History   First MD Initiated Contact with Patient 04/28/16 1249     Chief Complaint  Patient presents with  . Shortness of Breath      HPI  Pt was seen at 1300.  Per pt, c/o gradual onset and worsening of persistent cough, wheezing and SOB for the past 3 days.  Describes his symptoms as "my COPD is acting up." Pt states he came to the ED last week for these same symptoms, was rx inhaler, abx and prednisone. Pt states he ran out of the inhaler and prednisone 3 days ago and his symptoms returned. Pt states he is still taking his antibiotics. Pt continues to smoke cigarettes. Denies CP/palpitations, no back pain, no abd pain, no N/V/D, no fevers, no rash.    Past Medical History  Diagnosis Date  . PVC (premature ventricular contraction)     Frequent  . Hypertension   . Hyperlipidemia   . Tobacco abuse   . Chronic bronchitis   . DJD (degenerative joint disease)   . Migraine   . Tobacco abuse     40 pack years  . Chest discomfort     negative stress echocardiogram-01/2011  . Dyspnea    Past Surgical History  Procedure Laterality Date  . None      Social History  Substance Use Topics  . Smoking status: Current Every Day Smoker  . Smokeless tobacco: Never Used  . Alcohol Use: No    Review of Systems ROS: Statement: All systems negative except as marked or noted in the HPI; Constitutional: Negative for fever and chills. ; ; Eyes: Negative for eye pain, redness and discharge. ; ; ENMT: Negative for ear pain, hoarseness, nasal congestion, sinus pressure and sore throat. ; ; Cardiovascular: Negative for chest pain, palpitations, diaphoresis, and peripheral edema. ; ; Respiratory: +cough, wheezing, SOB. Negative for stridor. ; ; Gastrointestinal: Negative for nausea, vomiting, diarrhea, abdominal pain, blood in stool, hematemesis, jaundice and rectal bleeding. . ; ; Genitourinary: Negative for dysuria, flank pain and hematuria. ; ;  Musculoskeletal: Negative for back pain and neck pain. Negative for swelling and trauma.; ; Skin: Negative for pruritus, rash, abrasions, blisters, bruising and skin lesion.; ; Neuro: Negative for headache, lightheadedness and neck stiffness. Negative for weakness, altered level of consciousness , altered mental status, extremity weakness, paresthesias, involuntary movement, seizure and syncope.       Allergies  Review of patient's allergies indicates no known allergies.  Home Medications   Prior to Admission medications   Medication Sig Start Date End Date Taking? Authorizing Provider  doxycycline (VIBRAMYCIN) 100 MG capsule Take 1 capsule (100 mg total) by mouth 2 (two) times daily. For 10 days 04/20/16   Tammy Triplett, PA-C  ibuprofen (ADVIL,MOTRIN) 200 MG tablet Take 600 mg by mouth every 6 (six) hours as needed.    Historical Provider, MD  predniSONE (DELTASONE) 20 MG tablet 2 tabs (40 mg) po qd x 5 days 04/20/16   Tammy Triplett, PA-C   BP 168/91 mmHg  Pulse 67  Temp(Src) 97.9 F (36.6 C) (Oral)  Resp 24  Ht 6\' 4"  (1.93 m)  Wt 170 lb (77.111 kg)  BMI 20.70 kg/m2  SpO2 99% Physical Exam  1305: Physical examination:  Nursing notes reviewed; Vital signs and O2 SAT reviewed;  Constitutional: Well developed, Well nourished, Well hydrated, Uncomfortable appearing.;; Head:  Normocephalic, atraumatic; Eyes: EOMI, PERRL, No scleral icterus; ENMT: Mouth and pharynx  normal, Mucous membranes moist; Neck: Supple, Full range of motion, No lymphadenopathy; Cardiovascular: Regular rate and rhythm, No gallop; Respiratory: Breath sounds diminished & equal bilaterally, insp/exp wheezes bilat with occasional audible wheezing.  Speaking short sentences, sitting upright. Normal respiratory effort/excursion; Chest: Nontender, Movement normal; Abdomen: Soft, Nontender, Nondistended, Normal bowel sounds; Genitourinary: No CVA tenderness; Extremities: Pulses normal, No tenderness, No edema, No calf edema or  asymmetry.; Neuro: AA&Ox3, Major CN grossly intact.  Speech clear. No gross focal motor or sensory deficits in extremities.; Skin: Color normal, Warm, Dry.   ED Course  Procedures (including critical care time) Labs Review  Imaging Review  I have personally reviewed and evaluated these images and lab results as part of my medical decision-making.   EKG Interpretation   Date/Time:  Wednesday Apr 28 2016 13:00:55 EDT Ventricular Rate:  65 PR Interval:  135 QRS Duration: 70 QT Interval:  407 QTC Calculation: 423 R Axis:   81 Text Interpretation:  Sinus rhythm Borderline right axis deviation  Baseline wander in lead(s) V2 Confirmed by ZAVITZ MD, Vonna Kotyk [phone]) on  04/28/2016 1:08:15 PM      MDM  MDM Reviewed: nursing note, vitals and previous chart Reviewed previous: labs and ECG Interpretation: labs, ECG and x-ray     Results for orders placed or performed during the hospital encounter of 04/28/16  CBC with Differential  Result Value Ref Range   WBC 10.4 4.0 - 10.5 K/uL   RBC 4.75 4.22 - 5.81 MIL/uL   Hemoglobin 14.0 13.0 - 17.0 g/dL   HCT 42.9 39.0 - 52.0 %   MCV 90.3 78.0 - 100.0 fL   MCH 29.5 26.0 - 34.0 pg   MCHC 32.6 30.0 - 36.0 g/dL   RDW 13.0 11.5 - 15.5 %   Platelets 242 150 - 400 K/uL   Neutrophils Relative % 71 %   Neutro Abs 7.4 1.7 - 7.7 K/uL   Lymphocytes Relative 20 %   Lymphs Abs 2.1 0.7 - 4.0 K/uL   Monocytes Relative 8 %   Monocytes Absolute 0.9 0.1 - 1.0 K/uL   Eosinophils Relative 1 %   Eosinophils Absolute 0.1 0.0 - 0.7 K/uL   Basophils Relative 0 %   Basophils Absolute 0.0 0.0 - 0.1 K/uL  Basic metabolic panel  Result Value Ref Range   Sodium 136 135 - 145 mmol/L   Potassium 4.0 3.5 - 5.1 mmol/L   Chloride 99 (L) 101 - 111 mmol/L   CO2 31 22 - 32 mmol/L   Glucose, Bld 110 (H) 65 - 99 mg/dL   BUN 16 6 - 20 mg/dL   Creatinine, Ser 0.89 0.61 - 1.24 mg/dL   Calcium 8.4 (L) 8.9 - 10.3 mg/dL   GFR calc non Af Amer >60 >60 mL/min   GFR  calc Af Amer >60 >60 mL/min   Anion gap 6 5 - 15  Troponin I  Result Value Ref Range   Troponin I <0.03 <0.031 ng/mL   Dg Chest 2 View 04/28/2016  CLINICAL DATA:  Shortness of breath for 4 days, worsening. Smoker. Initial encounter. EXAM: CHEST  2 VIEW COMPARISON:  PA and lateral chest 04/20/2016 and 01/29/2011. FINDINGS: The chest is hyperexpanded with attenuation of the pulmonary vasculature. The lungs are clear. Heart size is normal. There is no pneumothorax or pleural effusion. IMPRESSION: COPD without acute disease. Electronically Signed   By: Inge Rise M.D.   On: 04/28/2016 14:44    1530:  Pt states he "feels better" after neb  and steroid.  NAD, lungs CTA bilat, no wheezing, resps easy, speaking full sentences, Sats 100% R/A.  Pt ambulated around the ED with Sats remaining 96-97 % R/A, resps easy, NAD. Pt wants to go home now.  Tx symptomatically at this time. Dx and testing d/w pt and family.  Questions answered.  Verb understanding, agreeable to d/c home with outpt f/u.    Francine Graven, DO 04/30/16 2204

## 2016-04-28 NOTE — ED Notes (Signed)
Pt c/o sob since SUnday.  Reports was here for same last Wednesday but he got better.  Denies any pain.

## 2016-04-28 NOTE — ED Notes (Signed)
Patient ambulated in hall.  Patient walked without assistance. o2 sat 96% - 97%. No signs of distress

## 2016-04-28 NOTE — Discharge Instructions (Signed)
Take the prescriptions as directed.  Use your albuterol inhaler (2 to 4 puffs) every 4 hours for the next 7 days, then as needed for cough, wheezing, or shortness of breath. Try to stop smoking. Call your regular medical doctor today to schedule a follow up appointment within the next 3 days.  Return to the Emergency Department immediately sooner if worsening.

## 2017-01-03 ENCOUNTER — Encounter: Payer: Self-pay | Admitting: Family Medicine

## 2017-01-04 LAB — PULMONARY FUNCTION TEST

## 2017-07-28 ENCOUNTER — Other Ambulatory Visit (HOSPITAL_COMMUNITY): Payer: Self-pay | Admitting: Physician Assistant

## 2017-07-28 ENCOUNTER — Ambulatory Visit (HOSPITAL_COMMUNITY)
Admission: RE | Admit: 2017-07-28 | Discharge: 2017-07-28 | Disposition: A | Discharge status: Home / Self Care | Payer: Medicaid Other | Source: Ambulatory Visit | Attending: Physician Assistant | Admitting: Physician Assistant

## 2017-07-28 DIAGNOSIS — J439 Emphysema, unspecified: Secondary | ICD-10-CM | POA: Diagnosis not present

## 2017-07-28 DIAGNOSIS — R0602 Shortness of breath: Secondary | ICD-10-CM

## 2018-10-03 ENCOUNTER — Other Ambulatory Visit: Payer: Self-pay

## 2018-10-03 ENCOUNTER — Encounter (HOSPITAL_COMMUNITY): Payer: Self-pay

## 2018-10-03 ENCOUNTER — Emergency Department (HOSPITAL_COMMUNITY)
Admission: EM | Admit: 2018-10-03 | Discharge: 2018-10-03 | Disposition: A | Discharge status: Home / Self Care | Payer: Medicaid Other | Attending: Emergency Medicine | Admitting: Emergency Medicine

## 2018-10-03 ENCOUNTER — Emergency Department (HOSPITAL_COMMUNITY): Payer: Medicaid Other

## 2018-10-03 DIAGNOSIS — S61012A Laceration without foreign body of left thumb without damage to nail, initial encounter: Secondary | ICD-10-CM

## 2018-10-03 DIAGNOSIS — I10 Essential (primary) hypertension: Secondary | ICD-10-CM | POA: Insufficient documentation

## 2018-10-03 DIAGNOSIS — Y999 Unspecified external cause status: Secondary | ICD-10-CM | POA: Insufficient documentation

## 2018-10-03 DIAGNOSIS — J449 Chronic obstructive pulmonary disease, unspecified: Secondary | ICD-10-CM | POA: Insufficient documentation

## 2018-10-03 DIAGNOSIS — Z87891 Personal history of nicotine dependence: Secondary | ICD-10-CM | POA: Diagnosis not present

## 2018-10-03 DIAGNOSIS — Y929 Unspecified place or not applicable: Secondary | ICD-10-CM | POA: Insufficient documentation

## 2018-10-03 DIAGNOSIS — Z79899 Other long term (current) drug therapy: Secondary | ICD-10-CM | POA: Diagnosis not present

## 2018-10-03 DIAGNOSIS — W293XXA Contact with powered garden and outdoor hand tools and machinery, initial encounter: Secondary | ICD-10-CM | POA: Insufficient documentation

## 2018-10-03 DIAGNOSIS — Y939 Activity, unspecified: Secondary | ICD-10-CM | POA: Diagnosis not present

## 2018-10-03 DIAGNOSIS — S66222A Laceration of extensor muscle, fascia and tendon of left thumb at wrist and hand level, initial encounter: Secondary | ICD-10-CM | POA: Diagnosis not present

## 2018-10-03 HISTORY — DX: Chronic obstructive pulmonary disease, unspecified: J44.9

## 2018-10-03 MED ORDER — CEFAZOLIN SODIUM-DEXTROSE 2-4 GM/100ML-% IV SOLN
2.0000 g | Freq: Once | INTRAVENOUS | Status: DC
  Filled 2018-10-03: qty 100

## 2018-10-03 MED ORDER — OXYCODONE-ACETAMINOPHEN 5-325 MG PO TABS: 1.0000 | ORAL_TABLET | Freq: Four times a day (QID) | ORAL | 0 refills | Status: DC | PRN

## 2018-10-03 MED ORDER — STERILE WATER FOR INJECTION IJ SOLN
INTRAMUSCULAR | Status: AC
  Filled 2018-10-03: qty 10

## 2018-10-03 MED ORDER — BUPIVACAINE HCL 0.5 % IJ SOLN
50.0000 mL | Freq: Once | INTRAMUSCULAR | Status: AC
  Administered 2018-10-03: 50 mL
  Filled 2018-10-03: qty 50

## 2018-10-03 MED ORDER — CEFAZOLIN SODIUM 1 G IJ SOLR
2.0000 g | Freq: Once | INTRAMUSCULAR | Status: AC
Start: 2018-10-03 — End: 2018-10-03
  Administered 2018-10-03: 2 g via INTRAMUSCULAR
  Filled 2018-10-03: qty 20

## 2018-10-03 MED ORDER — DOXYCYCLINE HYCLATE 100 MG PO CAPS: 100.0000 mg | ORAL_CAPSULE | Freq: Two times a day (BID) | ORAL | 0 refills | Status: DC

## 2018-10-03 MED ORDER — LIDOCAINE HCL 2 % IJ SOLN
20.0000 mL | Freq: Once | INTRAMUSCULAR | Status: AC
  Administered 2018-10-03: 400 mg
  Filled 2018-10-03: qty 20

## 2018-10-03 NOTE — Consult Note (Signed)
Reason for Consult: Chain saw injury left hand with tendon injury Referring Physician: ER staff  Scott Hubbard is an 62 y.o. male.  HPI: Patient presents for evaluation.  He had signs of laceration to his left hand he has tendon involvement.  Patient has history of COPD.  He is disabled due to his COPD.  He has great difficulty with shortness of breath etc.  We have given him antibiotics tonight in the emergency room.  Following this we discussed him the predicament and plan for operative exploration and repair as necessary.  Past Medical History:  Diagnosis Date  . Chest discomfort    negative stress echocardiogram-01/2011  . Chronic bronchitis   . COPD (chronic obstructive pulmonary disease) ([place])   . DJD (degenerative joint disease)   . Dyspnea   . Hyperlipidemia   . Hypertension   . Migraine   . PVC (premature ventricular contraction)    Frequent  . Tobacco abuse   . Tobacco abuse    40 pack years    Past Surgical History:  Procedure Laterality Date  . None      No family history on file.  Social History:  reports that he has quit smoking. He has never used smokeless tobacco. He reports that he does not drink alcohol or use drugs.  Allergies: No Known Allergies  Medications: I have reviewed the patient's current medications.  No results found for this or any previous visit (from the past 48 hour(s)).  Dg Finger Thumb Left  Result Date: 10/03/2018 CLINICAL DATA:  Laceration of LEFT thumb by a chainsaw, prior amputation at distal phalanx from a remote injury EXAM: LEFT THUMB 2+V COMPARISON:  08/26/2006 FINDINGS: Osseous mineralization normal. 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 Joint spaces preserved. No acute fracture, dislocation, or bone destruction. No definite acute radiopaque foreign bodies. IMPRESSION: No acute osseous abnormalities. 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 Electronically Signed   By: Lavonia Dana M.D.   On: 10/03/2018 19:15    Review of Systems  Gastrointestinal: Negative.   Genitourinary: Negative.   Endo/Heme/Allergies: Negative.   Psychiatric/Behavioral: Negative.    Blood pressure (!) 174/106, pulse 70, temperature 97.8 F (36.6 C), temperature source Oral, resp. rate 16, height 6\' 5"  (1.956 m), weight 86.2 kg, SpO2 99 %. Physical Exam Laceration left hand with EPL tendon exposure.  The patient has no signs of infection compartment syndrome or dystrophic reaction.  The patient and I discussed all issues at length and his findings.  At present time he has findings consistent with significant disarray of the soft tissues.  I discussed him these issues at length and the findings.  The patient is alert and oriented in no acute distress. The patient complains of pain in the affected upper extremity.  The patient is noted to have a normal HEENT exam. Lung fields show equal chest expansion and no shortness of breath. Abdomen exam is nontender without distention. Lower extremity examination does not show any fracture dislocation or blood clot symptoms. Pelvis is stable and the neck and back are stable and nontender. Assessment/Plan: EPL tendon laceration status post chainsaw injury.  We are planning surgery for your upper extremity. The risk and benefits of surgery to include risk of bleeding, infection, anesthesia,  damage to normal structures and failure of the surgery to accomplish its intended goals of relieving symptoms and restoring function have been discussed in detail. With this in mind we plan to proceed.  I have specifically discussed with the patient the pre-and postoperative regime and the dos and don'ts and risk and benefits in great detail. Risk and benefits of surgery also include risk of dystrophy(CRPS), chronic nerve pain, failure of the healing process to go onto completion and other inherent risks of surgery The relavent the pathophysiology of the  disease/injury process, as well as the alternatives for treatment and postoperative course of action has been discussed in great detail with the patient who desires to proceed.  We will do everything in our power to help you (the patient) restore function to the upper extremity. It is a pleasure to see this patient today.   Procedure: Preoperative diagnosis chainsaw injury left hand with extensor pollicis longus laceration  postop diagnosis the same Procedure: Irrigation and debridement on skin subtenons tissue muscle and tendon this was an excisional debridement #1 #2 EPL tendon transfer to the EPB tendon #3 complex wound closure  Surgeon Roseanne Kaufman  Assistant none  Anesthesia local  Description of procedure: Patient was taken to the procedure suite and underwent a field block.  Given his COPD n.p.o. status and desire to not have surgery in the operative theater with any general anesthetic we performed a local block following this I then scrubbed him with 3 separate Betadine scrubs.  I then irrigated the area and performed irrigation and debridement with scalpel and scissor.  Debridement of skin subtenons tissue muscle and tendon was accomplished.  The bone was intact.  Following excisional debridement we then irrigated copiously.  Following this we then very carefully and cautiously look for the EPL proximal and.  This was retracted at a high level.  Thus given these findings I discussed with the patient options of tendon transfer versus a surgical reconstruction.  I discussed with the patient these findings and he desired to proceed with EPL to EPB tendon transfer.  The patient had an EPL and EPB tendon transfer secured with FiberWire suture there were no complicating features.  Following the FiberWire suture placement we then irrigated additionally.  This was in the EPL to EPB tendon transfer at the hand level.  Following this I irrigated copiously and closed the wound.  He tolerated  this well this was a fairly complex wound closure.  He was given doxycycline for 14 days pain management will see me in the office in 10 to 14 days.  All questions have been encouraged and answered.  Keep bandage clean and dry.  Call for any problems.  No smoking.  Criteria for driving a car: you should be off your pain medicine for 7-8 hours, able to drive one handed(confident), thinking clearly and feeling able in your judgement to drive. Continue elevation as it will decrease swelling.  If instructed by MD move your fingers within the confines of the bandage/splint.  Use ice if instructed by your MD. Call immediately for any sudden loss of feeling in your hand/arm or change in functional abilities of the extremity.  We recommend that you to take vitamin C 1000 mg a day to promote healing. We also recommend that if you require  pain medicine that you take a stool softener to prevent constipation as most pain medicines will have constipation side effects. We recommend either Peri-Colace or Senokot and recommend that you also consider adding MiraLAX as well to prevent the constipation affects from pain medicine if you are required to use them. These medicines are over the counter and may be purchased at a local pharmacy. A  cup of yogurt and a probiotic can also be helpful during the recovery process as the medicines can disrupt your intestinal environment.   Scott Hubbard 10/03/2018, 9:55 PM

## 2018-10-03 NOTE — ED Notes (Signed)
Surgeon at bedside.  

## 2018-10-03 NOTE — ED Triage Notes (Signed)
Pt was using a chainsaw today when it got caught on his right hand, above his thumb. Bleeding is controlled at this time and pt is able to move his left thumb.

## 2018-10-03 NOTE — ED Provider Notes (Signed)
[facility] EMERGENCY DEPARTMENT Provider Note   CSN: [id number] Arrival date & time: 10/03/18  1752     History   Chief Complaint Chief Complaint  Patient presents with  . Hand Injury    HPI Scott Hubbard is a 62 y.o. male.  HPI   62 year old male presents today with laceration to his left thumb.  Patient notes he was using a chainsaw prior to evaluation when it cut the dorsal aspect of the left thumb.  He denies any loss of strength or sensation.  He was seen in urgent care who had concern for tendon involvement and referred him to the emergency room.  He is right-hand dominant, notes he was drinking Pepsi prior to my evaluation, last solid food was approximately 4 hours prior to arrival (2 PM).  He was given tetanus vaccination prior to my assessment.  Past Medical History:  Diagnosis Date  . Chest discomfort    negative stress echocardiogram-01/2011  . Chronic bronchitis   . COPD (chronic obstructive pulmonary disease) ([place])   . DJD (degenerative joint disease)   . Dyspnea   . Hyperlipidemia   . Hypertension   . Migraine   . PVC (premature ventricular contraction)    Frequent  . Tobacco abuse   . Tobacco abuse    40 pack years    Patient Active Problem List   Diagnosis Date Noted  . DJD (degenerative joint disease)   . Hypertension   . PVC (premature ventricular contraction)   . HYPERLIPIDEMIA 01/29/2011  . TOBACCO ABUSE 01/29/2011  . MIGRAINE HEADACHE 01/29/2011  . BRONCHITIS, CHRONIC 01/29/2011  . DYSPNEA 01/29/2011  . CHEST PAIN 01/29/2011    Past Surgical History:  Procedure Laterality Date  . None          Home Medications    Prior to Admission medications   Medication Sig Start Date End Date Taking? Authorizing Provider  albuterol (PROVENTIL HFA;VENTOLIN HFA) 108 (90 Base) MCG/ACT inhaler Inhale 2 puffs into the lungs every 4 (four) hours as needed for wheezing or shortness of breath. 04/28/16   Francine Graven, DO    doxycycline (VIBRAMYCIN) 100 MG capsule Take 1 capsule (100 mg total) by mouth 2 (two) times daily. 10/03/18   Ethanjames Fontenot, Dellis Filbert, PA-C  ibuprofen (ADVIL,MOTRIN) 200 MG tablet Take 400 mg by mouth as needed for headache or mild pain.     [provider]  naproxen sodium (ALEVE) 220 MG tablet Take 440 mg by mouth See admin instructions. Alternates with Ibuprofen as needed for headache and back pain    [provider]  oxyCODONE-acetaminophen (PERCOCET/ROXICET) 5-325 MG tablet Take 1 tablet by mouth every 6 (six) hours as needed for severe pain. 10/03/18   Terrah Decoster, Dellis Filbert, PA-C  predniSONE (DELTASONE) 20 MG tablet Take 2 tablets (40 mg total) by mouth daily. 04/28/16   Francine Graven, DO    Family History No family history on file.  Social History Social History   Tobacco Use  . Smoking status: Former Research scientist (life sciences)  . Smokeless tobacco: Never Used  . Tobacco comment: Apr 27 2016  Substance Use Topics  . Alcohol use: No  . Drug use: No     Allergies   Patient has no known allergies.   Review of Systems Review of Systems  All other systems reviewed and are negative.  Physical Exam Updated Vital Signs BP (!) 174/106 (BP Location: Right Arm)   Pulse 70   Temp 97.8 F (36.6 C) (Oral)   Resp  16   Ht 6\' 5"  (1.956 m)   Wt 86.2 kg   SpO2 99%   BMI 22.53 kg/m   Physical Exam  Constitutional: He is oriented to person, place, and time. He appears well-developed and well-nourished.  HENT:  Head: Normocephalic and atraumatic.  Eyes: Pupils are equal, round, and reactive to light. Conjunctivae are normal. Right eye exhibits no discharge. Left eye exhibits no discharge. No scleral icterus.  Neck: Normal range of motion. No JVD present. No tracheal deviation present.  Pulmonary/Chest: Effort normal. No stridor.  Musculoskeletal:  Left thumb with 2 cm laceration as noted in the photo with laceration through the APL-patient is able to extend at the MCP and DIP-slight  decreased sensation of the dorsal aspect of the distal phalanx (baseline per patient)  Neurological: He is alert and oriented to person, place, and time. Coordination normal.  Psychiatric: He has a normal mood and affect. His behavior is normal. Judgment and thought content normal.  Nursing note and vitals reviewed.      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 Joint spaces preserved. No acute fracture, dislocation, or bone destruction. No definite acute radiopaque foreign bodies. IMPRESSION: No acute osseous abnormalities. 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 Electronically Signed   By: Lavonia Dana M.D.   On: 10/03/2018 19:15    Procedures Procedures (including critical care time)  Medications Ordered in ED Medications  sterile water (preservative free) injection (has no administration in time range)  bupivacaine (MARCAINE) 0.5 % (with pres) injection 50 mL (50 mLs Infiltration Given 10/03/18 1830)  lidocaine (XYLOCAINE) 2 % (with pres) injection 400 mg (400 mg Infiltration Given 10/03/18 1815)  ceFAZolin (ANCEF) injection 2 g (2 g Intramuscular Given 10/03/18 2118)     Initial Impression / Assessment and Plan / ED Course  I have reviewed the triage vital signs and the nursing notes.  Pertinent labs & imaging results that were available during my care of the patient were reviewed by me and considered in my medical decision making (see chart for details).     Labs:   Imaging: DG finger thumb left  Consults: Dr.  Amedeo Plenty  Therapeutics: Ancef  Discharge Meds: Doxycycline, Percocet  Assessment/Plan: 62 year old male presents today with laceration of his left hand.  He has a laceration of the tendon.  Wound was copiously irrigated here, hand surgery was consulted.  Dr. Amedeo Plenty evaluated patient with procedure performed here in the ED.  Patient was discharged with outpatient follow-up, antibiotics, pain medication.  Strict return precautions given, he verbalized understanding and agreement to today's plan had no further questions or concerns.      Final Clinical Impressions(s) / ED Diagnoses   Final diagnoses:  Laceration of left thumb with tendon involvement, initial encounter    ED Discharge Orders         Ordered    doxycycline (VIBRAMYCIN) 100 MG capsule  2 times daily     10/03/18 2138    oxyCODONE-acetaminophen (PERCOCET/ROXICET) 5-325 MG tablet  Every 6 hours PRN     10/03/18 2138  Okey Regal, PA-C 10/03/18 2142    Lennice Sites, DO 10/03/18 2142

## 2018-10-03 NOTE — Discharge Instructions (Addendum)
Please follow instructions given by Dr. Amedeo Plenty.  Please contact Dr. Vanetta Shawl office for follow-up appointment.  Please return to the emergency room if you develop any new or worsening signs or symptoms.  Please use medications only as directed.

## 2018-10-12 DIAGNOSIS — M79642 Pain in left hand: Secondary | ICD-10-CM | POA: Insufficient documentation

## 2018-10-12 DIAGNOSIS — S61419A Laceration without foreign body of unspecified hand, initial encounter: Secondary | ICD-10-CM | POA: Insufficient documentation

## 2019-08-18 LAB — PSA: PSA: 1.5

## 2019-08-27 ENCOUNTER — Ambulatory Visit: Payer: Medicaid Other | Admitting: Family Medicine

## 2019-08-28 ENCOUNTER — Other Ambulatory Visit (HOSPITAL_COMMUNITY)
Admission: RE | Admit: 2019-08-28 | Discharge: 2019-08-28 | Disposition: A | Discharge status: Home / Self Care | Payer: Medicare Other | Source: Ambulatory Visit | Attending: Family Medicine | Admitting: Family Medicine

## 2019-08-28 ENCOUNTER — Other Ambulatory Visit: Payer: Self-pay

## 2019-08-28 ENCOUNTER — Encounter: Payer: Self-pay | Admitting: Family Medicine

## 2019-08-28 ENCOUNTER — Ambulatory Visit (INDEPENDENT_AMBULATORY_CARE_PROVIDER_SITE_OTHER): Payer: Medicare Other | Admitting: Family Medicine

## 2019-08-28 VITALS — BP 148/92 | HR 89 | Temp 97.7°F | Ht 77.0 in | Wt 188.0 lb

## 2019-08-28 DIAGNOSIS — R7309 Other abnormal glucose: Secondary | ICD-10-CM

## 2019-08-28 DIAGNOSIS — J449 Chronic obstructive pulmonary disease, unspecified: Secondary | ICD-10-CM | POA: Diagnosis not present

## 2019-08-28 DIAGNOSIS — I10 Essential (primary) hypertension: Secondary | ICD-10-CM

## 2019-08-28 DIAGNOSIS — R399 Unspecified symptoms and signs involving the genitourinary system: Secondary | ICD-10-CM | POA: Insufficient documentation

## 2019-08-28 DIAGNOSIS — Z1211 Encounter for screening for malignant neoplasm of colon: Secondary | ICD-10-CM

## 2019-08-28 LAB — POCT URINALYSIS DIP (MANUAL ENTRY)
Bilirubin, UA: NEGATIVE
Blood, UA: NEGATIVE
Glucose, UA: NEGATIVE mg/dL
Ketones, POC UA: NEGATIVE mg/dL
Leukocytes, UA: NEGATIVE
Nitrite, UA: NEGATIVE
Protein Ur, POC: NEGATIVE mg/dL
Spec Grav, UA: >=1.03 — AB (ref 1.010–1.025)
Urobilinogen, UA: 1 E.U./dL
pH, UA: 5 (ref 5.0–8.0)

## 2019-08-28 MED ORDER — VALSARTAN 40 MG PO TABS: 40.0000 mg | ORAL_TABLET | Freq: Every day | ORAL | 1 refills | Status: DC

## 2019-08-28 MED ORDER — ALBUTEROL SULFATE (2.5 MG/3ML) 0.083% IN NEBU: 2.5000 mg | INHALATION_SOLUTION | Freq: Four times a day (QID) | RESPIRATORY_TRACT | 12 refills | Status: DC | PRN

## 2019-08-28 NOTE — Patient Instructions (Addendum)
Increase  flovent to twice a day Use albuterol nebulizer when SOB  Start valsartan 40mg  (blood pressure medication)  miralax-1 dose a day in 8 oz water- this will help improve bowel movements

## 2019-08-28 NOTE — Progress Notes (Signed)
New Patient Office Visit  Subjective:  Patient ID: Scott Hubbard, male    DOB: [birth year]  Age: 63 y.o. MRN: [id number]  CC:  Chief Complaint  Patient presents with  . New Patient (Initial Visit)  . Elevated PSA  . COPD    sob    HPI Scott Hubbard presents for COPD follow up and UTI follow up  Dr. [name] started pt on Stiolto, flovent, proventil-1 year ago-pt uses inhalers once a day.   Pt used nebulizer in the past but was unsure of how it helped him  Diagnosis COPD-no CT, last cxr with COPD  Pt took blood pressure medication in the past- he has not taken medication since prison -bp elevated when assessment from UC systolic 496/ diastolic 99  Pt with laceration of the left hand-cut with chain saw--LROM-tetnus shot given at that time  Pt with urination -"unable to go", no pain.  Pt states urine was orange colored.  Took antibiotics-culture negative.  Blood work abnormal for PSA and pt was instructed that he needs to see primary care.  Pt with UTI 6 months ago. PSA 1.5 when checked at urgent care(verbal results per UC) -pt with multiple visits to Urgent Care in the last few months  Pt with constipation-no blood in stool, no fruits and veggies in diet. Pt states he cooks at his home. Lives with girl friend who has DM  No FH prostate cancer.    Reviewed urgent care records-u/a 11/24/18 notes glucose 100, bilirubin moderate, 40 ketones, large blood, 300mg  protein, large leukocytes, +nitrite-given cipro. 08/09/19 appt dysuria and hesitancy of micturition-urie with trace blood o/w normal Past Medical History:  Diagnosis Date  . Chest discomfort    negative stress echocardiogram-01/2011  . Chronic bronchitis   . COPD (chronic obstructive pulmonary disease) ([place])   . DJD (degenerative joint disease)   . Dyspnea   . Hyperlipidemia   . Hypertension   . Migraine   . PVC (premature ventricular contraction)    Frequent  . Tobacco abuse   . Tobacco abuse    40 pack years     PSH-injury -thumb-surgery Injury right finger  FH-mother -CAD, cholesterol, CVA-  Quit smoking -May 9th , 2017-went to ER -breathing treatment -decided to quit 40 years x 1 pack/day, sometimes pt smokes more  NO CT in the past No h/o cancer-COPD symptoms intermittently-Saw Dr. [name]   Social History   Socioeconomic History  . Marital status: Legally Separated    Spouse name: Not on file  . Number of children: 2  . Years of education: Not on file  . Highest education level: Not on file  Occupational History  . Occupation: unemployed    Fish farm manager: UNEMPLOYED  Social Needs  . Financial resource strain: Not on file  . Food insecurity    Worry: Not on file    Inability: Not on file  . Transportation needs    Medical: Not on file    Non-medical: Not on file  Tobacco Use  . Smoking status: Former Research scientist (life sciences)  . Smokeless tobacco: Never Used  . Tobacco comment: Apr 27 2016  Substance and Sexual Activity  . Alcohol use: No  . Drug use: No  . Sexual activity: Not on file  Lifestyle  . Physical activity    Days per week: Not on file    Minutes per session: Not on file  . Stress: Not on file  Relationships  . Social Herbalist on phone: Not  on file    Gets together: Not on file    Attends religious service: Not on file    Active member of club or organization: Not on file    Attends meetings of clubs or organizations: Not on file    Relationship status: Not on file  . Intimate partner violence    Fear of current or ex partner: Not on file    Emotionally abused: Not on file    Physically abused: Not on file    Forced sexual activity: Not on file  Other Topics Concern  . Not on file  Social History Narrative  . Not on file    ROS Review of Systems  Constitutional: Negative for fatigue and fever.  HENT: Negative for hearing loss and sinus pain.        No hearing aids  Eyes:       Wears glasses  Respiratory: Positive for chest tightness and shortness of  breath.        Disability from COPD  Cardiovascular: Negative for chest pain and leg swelling.  Gastrointestinal: Positive for constipation. Negative for blood in stool.       Hernia  Genitourinary: Positive for decreased urine volume. Negative for difficulty urinating, discharge, dysuria, enuresis, flank pain, frequency, penile pain, testicular pain and urgency.       Urine with odd color  Musculoskeletal: Positive for arthralgias and myalgias.       Right forefinger and Left thumb injury  Neurological: Negative for dizziness and headaches.  Psychiatric/Behavioral: Negative for agitation. The patient is not nervous/anxious.     Objective:   Today's Vitals: BP (!) 148/92 (BP Location: Left Arm, Patient Position: Sitting, Cuff Size: Normal)   Pulse 89   Temp 97.7 F (36.5 C) (Oral)   Ht 6\' 5"  (1.956 m)   Wt 188 lb (85.3 kg)   SpO2 95%   BMI 22.29 kg/m   Physical Exam Constitutional:      Appearance: Normal appearance.  HENT:     Head: Normocephalic and atraumatic.     Right Ear: Tympanic membrane and external ear normal.     Left Ear: Tympanic membrane, ear canal and external ear normal.     Nose: Nose normal.     Mouth/Throat:     Pharynx: Oropharynx is clear.  Eyes:     Conjunctiva/sclera: Conjunctivae normal.  Neck:     Musculoskeletal: Normal range of motion and neck supple.  Cardiovascular:     Rate and Rhythm: Normal rate and regular rhythm.     Heart sounds: Normal heart sounds.  Pulmonary:     Comments: Decrease BS bilat, no rhonchi or wheezing Neurological:     General: No focal deficit present.     Mental Status: He is alert.  Psychiatric:        Mood and Affect: Mood normal.     Assessment & Plan:   1. Hypertension, unspecified type - CBC with Differential - COMPLETE METABOLIC PANEL WITH GFR - Lipid panel - TSH Valsartan-new start daily-risk/benefit/side effects d/w pt U/a today 2. Elevated glucose level a1c  3. Chronic obstructive pulmonary  disease, unspecified COPD type ([place]) COPD disability-pt with difficulty breathing Start flovent BID Use albuterol when feeling tight or SOB Albuterol nebulizer solution-rx 4. Screening for colon cancer - Cologuard-d/w pt testing and reason for test-colon CA screen 5. Urinary symptom or sign - Ambulatory referral to Urology - POCT urinalysis dipstick today-completed bactrim  PSA normal -concern for recurrent urinary symptoms H/o hematuria-+tob  history G/C pending on urine Follow-up: 1 month  Scott Birmingham Hannah Beat, MD

## 2019-08-29 ENCOUNTER — Telehealth: Payer: Self-pay

## 2019-08-29 DIAGNOSIS — R399 Unspecified symptoms and signs involving the genitourinary system: Secondary | ICD-10-CM

## 2019-08-29 NOTE — Telephone Encounter (Signed)
LeighAnn Marvene Strohm, CMA  

## 2019-08-29 NOTE — Telephone Encounter (Signed)
Spoke to [facility]

## 2019-08-29 NOTE — Telephone Encounter (Signed)
Scott Hubbard with [facility] is calling and states she needs a reorder on the urine test for Chlamydia and Gonorrhea and it needs to be placed through epic. She can be reached at [phone]

## 2019-08-30 LAB — URINE CYTOLOGY ANCILLARY ONLY
Chlamydia: NEGATIVE
Neisseria Gonorrhea: NEGATIVE

## 2019-09-04 LAB — COLOGUARD: Cologuard: NEGATIVE

## 2019-09-26 ENCOUNTER — Ambulatory Visit (INDEPENDENT_AMBULATORY_CARE_PROVIDER_SITE_OTHER): Payer: Medicare Other | Admitting: Urology

## 2019-09-26 DIAGNOSIS — N3000 Acute cystitis without hematuria: Secondary | ICD-10-CM | POA: Diagnosis not present

## 2019-09-27 ENCOUNTER — Encounter: Payer: Self-pay | Admitting: Family Medicine

## 2019-09-27 ENCOUNTER — Ambulatory Visit (INDEPENDENT_AMBULATORY_CARE_PROVIDER_SITE_OTHER): Payer: Medicare Other | Admitting: Family Medicine

## 2019-09-27 ENCOUNTER — Other Ambulatory Visit: Payer: Self-pay

## 2019-09-27 VITALS — BP 125/82 | HR 79 | Temp 98.8°F | Ht 77.0 in | Wt 193.4 lb

## 2019-09-27 DIAGNOSIS — Z720 Tobacco use: Secondary | ICD-10-CM

## 2019-09-27 DIAGNOSIS — R7309 Other abnormal glucose: Secondary | ICD-10-CM

## 2019-09-27 DIAGNOSIS — I10 Essential (primary) hypertension: Secondary | ICD-10-CM | POA: Diagnosis not present

## 2019-09-27 DIAGNOSIS — J449 Chronic obstructive pulmonary disease, unspecified: Secondary | ICD-10-CM

## 2019-09-27 NOTE — Patient Instructions (Addendum)
[facility] -labwork  CT lung-screening for lung cancer

## 2019-09-27 NOTE — Progress Notes (Signed)
Established Patient Office Visit  Subjective:  Patient ID: Scott Hubbard, male    DOB: Dec 28, 1955  Age: 63 y.o. MRN: [id number]  CC:  Chief Complaint  Patient presents with  . Hypertension    follow up    HPI Rishabh A Brodrick presents for COPD-pt with continued SOB  Urology with no retention on testing  HTN-taking valsartan daily, no CP/headaches  Tob use- 1.5 pkx 40 years=60 pack years, quit 3 years ago   Past Medical History:  Diagnosis Date  . Chest discomfort    negative stress echocardiogram-01/2011  . Chronic bronchitis   . COPD (chronic obstructive pulmonary disease) ([place])   . DJD (degenerative joint disease)   . Dyspnea   . Hyperlipidemia   . Hypertension   . Migraine   . PVC (premature ventricular contraction)    Frequent  . Tobacco abuse   . Tobacco abuse    40 pack years    Past Surgical History:  Procedure Laterality Date  . None        Social History  Live with girl friend Socioeconomic History  . Marital status: Legally Separated    Spouse name: Not on file  . Number of children: 2  . Years of education: Not on file  . Highest education level: Not on file  Occupational History  . Occupation: unemployed    Fish farm manager: UNEMPLOYED  Social Needs  . Financial resource strain: Not on file  . Food insecurity    Worry: Not on file    Inability: Not on file  . Transportation needs    Medical: Not on file    Non-medical: Not on file  Tobacco Use  . Smoking status: Former Research scientist (life sciences)  . Smokeless tobacco: Never Used  . Tobacco comment: Apr 27 2016  Substance and Sexual Activity  . Alcohol use: No  . Drug use: No  . Sexual activity: Not on file  Lifestyle  . Physical activity    Days per week: Not on file    Minutes per session: Not on file  . Stress: Not on file  Relationships  . Social Herbalist on phone: Not on file    Gets together: Not on file    Attends religious service: Not on file    Active member of club or  organization: Not on file    Attends meetings of clubs or organizations: Not on file    Relationship status: Not on file  . Intimate partner violence    Fear of current or ex partner: Not on file    Emotionally abused: Not on file    Physically abused: Not on file    Forced sexual activity: Not on file  Other Topics Concern  . Not on file  Social History Narrative  . Not on file    Outpatient Medications Prior to Visit  Medication Sig Dispense Refill  . albuterol (PROVENTIL HFA;VENTOLIN HFA) 108 (90 Base) MCG/ACT inhaler Inhale 2 puffs into the lungs every 4 (four) hours as needed for wheezing or shortness of breath. 1 Inhaler 0  . albuterol (PROVENTIL) (2.5 MG/3ML) 0.083% nebulizer solution Take 3 mLs (2.5 mg total) by nebulization every 6 (six) hours as needed for wheezing or shortness of breath. 75 mL 12  . fluticasone (FLOVENT HFA) 220 MCG/ACT inhaler Inhale into the lungs 2 (two) times daily.    . Tiotropium Bromide-Olodaterol (STIOLTO RESPIMAT) 2.5-2.5 MCG/ACT AERS Inhale into the lungs.    . valsartan (DIOVAN) 40 MG  tablet Take 1 tablet (40 mg total) by mouth daily. 30 tablet 1   No facility-administered medications prior to visit.     No Known Allergies  ROS Review of Systems  Respiratory: Positive for cough, chest tightness and shortness of breath.   Cardiovascular:       Ecg 5/17  Gastrointestinal: Negative.   Endocrine: Negative.   Genitourinary: Negative.   Psychiatric/Behavioral: Negative.       Objective:    Physical Exam  Constitutional: He is oriented to person, place, and time. He appears well-developed and well-nourished.  HENT:  Head: Normocephalic and atraumatic.  Eyes: Conjunctivae are normal.  Neck: Normal range of motion. Neck supple.  Cardiovascular: Normal rate, regular rhythm, normal heart sounds and intact distal pulses.  Pulmonary/Chest: Effort normal.  Decrease expansion bilat  Abdominal: Soft. Bowel sounds are normal. There is abdominal  tenderness. There is no rebound.  Umbilical hernia  Neurological: He is oriented to person, place, and time.  Psychiatric: He has a normal mood and affect.    BP 125/82 (BP Location: Right Arm, Patient Position: Sitting, Cuff Size: Normal)   Pulse 79   Temp 98.8 F (37.1 C) (Oral)   Ht 6\' 5"  (1.956 m)   Wt 193 lb 6.4 oz (87.7 kg)   SpO2 94%   BMI 22.93 kg/m  Wt Readings from Last 3 Encounters:  09/27/19 193 lb 6.4 oz (87.7 kg)  08/28/19 188 lb (85.3 kg)  10/03/18 190 lb (86.2 kg)     Health Maintenance Due  Topic Date Due  . Hepatitis C Screening  08/28/1956  . HIV Screening  03/22/1969  . COLONOSCOPY  03/22/2004  . PNA vac Low Risk Adult (1 of 2 - PCV13) 03/23/2019  . INFLUENZA VACCINE  07/21/2019    There are no preventive care reminders to display for this patient.  No results found for: TSH Lab Results  Component Value Date   WBC 10.4 04/28/2016   HGB 14.0 04/28/2016   HCT 42.9 04/28/2016   MCV 90.3 04/28/2016   PLT 242 04/28/2016   Lab Results  Component Value Date   NA 136 04/28/2016   K 4.0 04/28/2016   CO2 31 04/28/2016   GLUCOSE 110 (H) 04/28/2016   BUN 16 04/28/2016   CREATININE 0.89 04/28/2016   ALKPHOS 76 01/12/2011   AST 15 01/12/2011   ALT <8 01/12/2011   PROT 6.9 01/12/2011   ALBUMIN 4.3 01/12/2011   CALCIUM 8.4 (L) 04/28/2016   ANIONGAP 6 04/28/2016   Lab Results  Component Value Date   CHOL 207 01/12/2011   Lab Results  Component Value Date   HDL 38 01/12/2011   Lab Results  Component Value Date   LDLCALC 21 01/12/2011   Lab Results  Component Value Date   TRIG 106 01/12/2011   Assessment & Plan:  1. Tobacco use - CT CHEST LUNG CA SCREEN LOW DOSE W/O CM; Future  2. Hypertension, unspecified type Valsartan-rx  3. Chronic obstructive pulmonary disease, unspecified COPD type ([facility]) Continue inhalers, CT screen lung cancer 4. Elevated glucose level A1c Cobe Viney Hannah Beat, MD

## 2019-09-28 LAB — COMPLETE METABOLIC PANEL WITH GFR
AG Ratio: 1.7 (calc) (ref 1.0–2.5)
ALT: 10 U/L (ref 9–46)
AST: 14 U/L (ref 10–35)
Albumin: 4.3 g/dL (ref 3.6–5.1)
Alkaline phosphatase (APISO): 73 U/L (ref 35–144)
BUN: 20 mg/dL (ref 7–25)
CO2: 28 mmol/L (ref 20–32)
Calcium: 9.2 mg/dL (ref 8.6–10.3)
Chloride: 101 mmol/L (ref 98–110)
Creat: 1.1 mg/dL (ref 0.70–1.25)
GFR, Est African American: 81 mL/min/{1.73_m2} (ref >=60)
GFR, Est Non African American: 70 mL/min/{1.73_m2} (ref >=60)
Globulin: 2.6 g/dL (calc) (ref 1.9–3.7)
Glucose, Bld: 88 mg/dL (ref 65–139)
Potassium: 5 mmol/L (ref 3.5–5.3)
Sodium: 139 mmol/L (ref 135–146)
Total Bilirubin: 0.6 mg/dL (ref 0.2–1.2)
Total Protein: 6.9 g/dL (ref 6.1–8.1)

## 2019-09-28 LAB — CBC WITH DIFFERENTIAL/PLATELET
Absolute Monocytes: 548 cells/uL (ref 200–950)
Basophils Absolute: 50 cells/uL (ref 0–200)
Basophils Relative: 0.6 %
Eosinophils Absolute: 108 cells/uL (ref 15–500)
Eosinophils Relative: 1.3 %
HCT: 41.6 % (ref 38.5–50.0)
Hemoglobin: 13.9 g/dL (ref 13.2–17.1)
Lymphs Abs: 2366 cells/uL (ref 850–3900)
MCH: 28.7 pg (ref 27.0–33.0)
MCHC: 33.4 g/dL (ref 32.0–36.0)
MCV: 86 fL (ref 80.0–100.0)
MPV: 10.3 fL (ref 7.5–12.5)
Monocytes Relative: 6.6 %
Neutro Abs: 5229 cells/uL (ref 1500–7800)
Neutrophils Relative %: 63 %
Platelets: 241 10*3/uL (ref 140–400)
RBC: 4.84 10*6/uL (ref 4.20–5.80)
RDW: 12.8 % (ref 11.0–15.0)
Total Lymphocyte: 28.5 %
WBC: 8.3 10*3/uL (ref 3.8–10.8)

## 2019-09-28 LAB — HEMOGLOBIN A1C
Hgb A1c MFr Bld: 5.7 % of total Hgb — ABNORMAL HIGH (ref <5.7)
Mean Plasma Glucose: 117 (calc)
eAG (mmol/L): 6.5 (calc)

## 2019-10-17 ENCOUNTER — Ambulatory Visit (HOSPITAL_COMMUNITY): Payer: Medicare Other

## 2019-10-20 ENCOUNTER — Other Ambulatory Visit: Payer: Self-pay | Admitting: Family Medicine

## 2019-11-14 ENCOUNTER — Other Ambulatory Visit: Payer: Self-pay | Admitting: Family Medicine

## 2019-11-16 DIAGNOSIS — K219 Gastro-esophageal reflux disease without esophagitis: Secondary | ICD-10-CM | POA: Insufficient documentation

## 2019-11-16 DIAGNOSIS — K2100 Gastro-esophageal reflux disease with esophagitis, without bleeding: Secondary | ICD-10-CM

## 2019-12-14 ENCOUNTER — Other Ambulatory Visit: Payer: Self-pay | Admitting: Family Medicine

## 2020-01-11 ENCOUNTER — Other Ambulatory Visit: Payer: Self-pay | Admitting: Family Medicine

## 2020-02-13 ENCOUNTER — Other Ambulatory Visit: Payer: Self-pay | Admitting: Family Medicine

## 2020-02-21 ENCOUNTER — Other Ambulatory Visit: Payer: Self-pay

## 2020-02-21 DIAGNOSIS — I10 Essential (primary) hypertension: Secondary | ICD-10-CM

## 2020-02-21 MED ORDER — VALSARTAN 40 MG PO TABS: 40.0000 mg | ORAL_TABLET | Freq: Every day | ORAL | 1 refills | Status: DC

## 2020-03-19 ENCOUNTER — Other Ambulatory Visit: Payer: Self-pay | Admitting: Emergency Medicine

## 2020-03-19 DIAGNOSIS — J449 Chronic obstructive pulmonary disease, unspecified: Secondary | ICD-10-CM

## 2020-03-24 ENCOUNTER — Telehealth: Payer: Self-pay | Admitting: Family Medicine

## 2020-03-24 ENCOUNTER — Other Ambulatory Visit: Payer: Self-pay

## 2020-03-24 MED ORDER — STIOLTO RESPIMAT 2.5-2.5 MCG/ACT IN AERS: 2.0000 | INHALATION_SPRAY | Freq: Every day | RESPIRATORY_TRACT | 5 refills | Status: DC

## 2020-03-24 NOTE — Telephone Encounter (Signed)
Pt requesting a refill on the following medication.  Tiotropium Bromide-Olodaterol (STIOLTO RESPIMAT) 2.5-2.5 MCG/ACT AERS   [id number] - [place], [place] [facility] [address] Phone:  [phone]  Fax:  [phone]

## 2020-03-24 NOTE — Telephone Encounter (Signed)
Sent in

## 2020-03-27 ENCOUNTER — Encounter: Payer: Self-pay | Admitting: Family Medicine

## 2020-03-27 ENCOUNTER — Ambulatory Visit (INDEPENDENT_AMBULATORY_CARE_PROVIDER_SITE_OTHER): Payer: Medicare Other | Admitting: Family Medicine

## 2020-03-27 ENCOUNTER — Other Ambulatory Visit: Payer: Self-pay

## 2020-03-27 VITALS — BP 140/82 | HR 78 | Temp 98.0°F | Ht 77.0 in | Wt 193.8 lb

## 2020-03-27 DIAGNOSIS — R079 Chest pain, unspecified: Secondary | ICD-10-CM

## 2020-03-27 DIAGNOSIS — J449 Chronic obstructive pulmonary disease, unspecified: Secondary | ICD-10-CM

## 2020-03-27 MED ORDER — ALBUTEROL SULFATE (2.5 MG/3ML) 0.083% IN NEBU: 2.5000 mg | INHALATION_SOLUTION | Freq: Four times a day (QID) | RESPIRATORY_TRACT | 1 refills | Status: DC | PRN

## 2020-03-27 NOTE — Patient Instructions (Signed)
° ° ° °  If you have lab work done today you will be contacted with your lab results within the next 2 weeks.  If you have not heard from us then please contact us. The fastest way to get your results is to register for My Chart. ° ° °IF you received an x-ray today, you will receive an invoice from [facility]. Please contact [facility] at [phone] with questions or concerns regarding your invoice.  ° °IF you received labwork today, you will receive an invoice from [facility]. Please contact [facility] at [phone] with questions or concerns regarding your invoice.  ° °Our billing staff will not be able to assist you with questions regarding bills from these companies. ° °You will be contacted with the lab results as soon as they are available. The fastest way to get your results is to activate your My Chart account. Instructions are located on the last page of this paperwork. If you have not heard from us regarding the results in 2 weeks, please contact this office. °  ° ° ° °

## 2020-03-27 NOTE — Progress Notes (Signed)
Established Patient Office Visit  Subjective:  Patient ID: Scott Hubbard, male    DOB: 11-Feb-1956  Age: 64 y.o. MRN: [id number]  CC:  Chief Complaint  Patient presents with  . Follow-up    6 month f/u hypertension an COPD    HPI Elius A Wingrove presents for COPD HTN-sinus tachycardia in the past-pt valsalva to decrease heart rate.  Pt states he remembered having concerns 5 years ago.   COPD-pt with albuterol/flovent/(STIOLTO RESPIMAT Walk part of the way to the mail box before stopping-pt has to rest then can restart. SOB. No chest pain.  Pt walked in Dr. [name]' office and did not qualify for oxygen with ambulation. Pt states he did not drop oxygen levels to qualify for home oxygen.  Pt states difficulty getting his breath.   Past Medical History:  Diagnosis Date  . Chest discomfort    negative stress echocardiogram-01/2011  . Chronic bronchitis   . COPD (chronic obstructive pulmonary disease) ([place])   . DJD (degenerative joint disease)   . Dyspnea   . Gastro-esophageal reflux disease with esophagitis   . GERD (gastroesophageal reflux disease)   . Hyperlipidemia   . Hypertension   . Migraine   . PVC (premature ventricular contraction)    Frequent  . Tobacco abuse   . Tobacco abuse    40 pack years    Past Surgical History:  Procedure Laterality Date  . None      No family history on file.  Social History   Socioeconomic History  . Marital status: Legally Separated    Spouse name: Not on file  . Number of children: 2  . Years of education: Not on file  . Highest education level: Not on file  Occupational History  . Occupation: unemployed    Fish farm manager: UNEMPLOYED  Tobacco Use  . Smoking status: Former Research scientist (life sciences)  . Smokeless tobacco: Never Used  . Tobacco comment: Apr 27 2016  Substance and Sexual Activity  . Alcohol use: No  . Drug use: No  . Sexual activity: Not on file  Other Topics Concern  . Not on file  Social History Narrative  . Not on file    Social Determinants of Health   Financial Resource Strain:   . Difficulty of Paying Living Expenses:   Food Insecurity:   . Worried About Charity fundraiser in the Last Year:   . Arboriculturist in the Last Year:   Transportation Needs:   . Film/video editor (Medical):   Marland Kitchen Lack of Transportation (Non-Medical):   Physical Activity:   . Days of Exercise per Week:   . Minutes of Exercise per Session:   Stress:   . Feeling of Stress :   Social Connections:   . Frequency of Communication with Friends and Family:   . Frequency of Social Gatherings with Friends and Family:   . Attends Religious Services:   . Active Member of Clubs or Organizations:   . Attends Archivist Meetings:   Marland Kitchen Marital Status:   Intimate Partner Violence:   . Fear of Current or Ex-Partner:   . Emotionally Abused:   Marland Kitchen Physically Abused:   . Sexually Abused:     Outpatient Medications Prior to Visit  Medication Sig Dispense Refill  . albuterol (PROVENTIL HFA;VENTOLIN HFA) 108 (90 Base) MCG/ACT inhaler Inhale 2 puffs into the lungs every 4 (four) hours as needed for wheezing or shortness of breath. 1 Inhaler 0  . fluticasone (FLOVENT  HFA) 220 MCG/ACT inhaler Inhale into the lungs 2 (two) times daily.    . Tiotropium Bromide-Olodaterol (STIOLTO RESPIMAT) 2.5-2.5 MCG/ACT AERS Inhale 2 Act into the lungs daily. 4 g 5  . valsartan (DIOVAN) 40 MG tablet Take 1 tablet (40 mg total) by mouth daily. 90 tablet 1  . albuterol (PROVENTIL) (2.5 MG/3ML) 0.083% nebulizer solution Take 3 mLs (2.5 mg total) by nebulization every 6 (six) hours as needed for wheezing or shortness of breath. 75 mL 12   No facility-administered medications prior to visit.    No Known Allergies  ROS Review of Systems  Respiratory: Positive for cough, chest tightness, shortness of breath and wheezing.   Cardiovascular: Positive for chest pain.  Musculoskeletal: Positive for arthralgias.       Elbow pain   Psychiatric/Behavioral: Negative for agitation.      Objective:    Physical Exam  Constitutional: He is oriented to person, place, and time. He appears well-developed and well-nourished.  HENT:  Head: Normocephalic and atraumatic.  Cardiovascular: Normal rate, regular rhythm, normal heart sounds and intact distal pulses.  Pulmonary/Chest: No respiratory distress. He has no wheezes.  Decreased breath sounds bilat  Neurological: He is alert and oriented to person, place, and time.    BP 140/82 (BP Location: Left Arm, Patient Position: Sitting, Cuff Size: Normal)   Pulse 78   Temp 98 F (36.7 C) (Temporal)   Ht 6\' 5"  (1.956 m)   Wt 193 lb 12.8 oz (87.9 kg)   SpO2 95%   BMI 22.98 kg/m  Wt Readings from Last 3 Encounters:  03/27/20 193 lb 12.8 oz (87.9 kg)  09/27/19 193 lb 6.4 oz (87.7 kg)  08/28/19 188 lb (85.3 kg)     Health Maintenance Due  Topic Date Due  . Hepatitis C Screening  Never done  . COLONOSCOPY  Never done  . PNA vac Low Risk Adult (1 of 2 - PCV13) Never done    Lab Results  Component Value Date   WBC 8.3 09/27/2019   HGB 13.9 09/27/2019   HCT 41.6 09/27/2019   MCV 86.0 09/27/2019   PLT 241 09/27/2019   Lab Results  Component Value Date   NA 139 09/27/2019   K 5.0 09/27/2019   CO2 28 09/27/2019   GLUCOSE 88 09/27/2019   BUN 20 09/27/2019   CREATININE 1.10 09/27/2019   BILITOT 0.6 09/27/2019   ALKPHOS 76 01/12/2011   AST 14 09/27/2019   ALT 10 09/27/2019   PROT 6.9 09/27/2019   ALBUMIN 4.3 01/12/2011   CALCIUM 9.2 09/27/2019   ANIONGAP 6 04/28/2016   Lab Results  Component Value Date   CHOL 207 01/12/2011   Lab Results  Component Value Date   HDL 38 01/12/2011   Lab Results  Component Value Date   LDLCALC 21 01/12/2011   Lab Results  Component Value Date   TRIG 106 01/12/2011   No results found for: [facility] Lab Results  Component Value Date   HGBA1C 5.7 (H) 09/27/2019      Assessment & Plan:  1. Chest pain, unspecified  type - EKG 12-Lead Cardio referral-SR with PVC-h/o tachycardia 2. Chronic obstructive pulmonary disease, unspecified COPD type ([place]) Flovent/stiolto/proventil neb Follow-up: cardiology/pulmonary referral Pulmonary referral  Chesky Heyer Hannah Beat, MD

## 2020-04-01 ENCOUNTER — Other Ambulatory Visit: Payer: Self-pay | Admitting: Emergency Medicine

## 2020-04-01 DIAGNOSIS — J449 Chronic obstructive pulmonary disease, unspecified: Secondary | ICD-10-CM

## 2020-04-01 MED ORDER — STIOLTO RESPIMAT 2.5-2.5 MCG/ACT IN AERS: 2.0000 | INHALATION_SPRAY | Freq: Every day | RESPIRATORY_TRACT | 5 refills | Status: AC

## 2020-04-09 ENCOUNTER — Other Ambulatory Visit (HOSPITAL_COMMUNITY)
Admission: RE | Admit: 2020-04-09 | Discharge: 2020-04-09 | Disposition: A | Discharge status: Home / Self Care | Payer: Medicare Other | Source: Ambulatory Visit | Attending: Internal Medicine | Admitting: Internal Medicine

## 2020-04-09 ENCOUNTER — Other Ambulatory Visit: Payer: Self-pay

## 2020-04-09 ENCOUNTER — Ambulatory Visit (INDEPENDENT_AMBULATORY_CARE_PROVIDER_SITE_OTHER): Payer: Medicare Other | Admitting: Internal Medicine

## 2020-04-09 ENCOUNTER — Ambulatory Visit (HOSPITAL_COMMUNITY)
Admission: RE | Admit: 2020-04-09 | Discharge: 2020-04-09 | Disposition: A | Discharge status: Home / Self Care | Payer: Medicare Other | Source: Ambulatory Visit | Attending: Internal Medicine | Admitting: Internal Medicine

## 2020-04-09 ENCOUNTER — Encounter: Payer: Self-pay | Admitting: Internal Medicine

## 2020-04-09 DIAGNOSIS — R0600 Dyspnea, unspecified: Secondary | ICD-10-CM

## 2020-04-09 DIAGNOSIS — I10 Essential (primary) hypertension: Secondary | ICD-10-CM | POA: Diagnosis not present

## 2020-04-09 DIAGNOSIS — J449 Chronic obstructive pulmonary disease, unspecified: Secondary | ICD-10-CM

## 2020-04-09 DIAGNOSIS — R0609 Other forms of dyspnea: Secondary | ICD-10-CM

## 2020-04-09 LAB — BASIC METABOLIC PANEL
Anion gap: 8 (ref 5–15)
BUN: 19 mg/dL (ref 8–23)
CO2: 29 mmol/L (ref 22–32)
Calcium: 8.8 mg/dL — ABNORMAL LOW (ref 8.9–10.3)
Chloride: 98 mmol/L (ref 98–111)
Creatinine, Ser: 1.22 mg/dL (ref 0.61–1.24)
GFR calc Af Amer: >60 mL/min (ref >60)
GFR calc non Af Amer: >60 mL/min (ref >60)
Glucose, Bld: 108 mg/dL — ABNORMAL HIGH (ref 70–99)
Potassium: 4.4 mmol/L (ref 3.5–5.1)
Sodium: 135 mmol/L (ref 135–145)

## 2020-04-09 LAB — CBC WITH DIFFERENTIAL/PLATELET
Abs Immature Granulocytes: 0.01 10*3/uL (ref 0.00–0.07)
Basophils Absolute: 0 10*3/uL (ref 0.0–0.1)
Basophils Relative: 0 %
Eosinophils Absolute: 0.1 10*3/uL (ref 0.0–0.5)
Eosinophils Relative: 1 %
HCT: 44.3 % (ref 39.0–52.0)
Hemoglobin: 14.3 g/dL (ref 13.0–17.0)
Immature Granulocytes: 0 %
Lymphocytes Relative: 27 %
Lymphs Abs: 1.9 10*3/uL (ref 0.7–4.0)
MCH: 29.1 pg (ref 26.0–34.0)
MCHC: 32.3 g/dL (ref 30.0–36.0)
MCV: 90.2 fL (ref 80.0–100.0)
Monocytes Absolute: 0.4 10*3/uL (ref 0.1–1.0)
Monocytes Relative: 5 %
Neutro Abs: 4.6 10*3/uL (ref 1.7–7.7)
Neutrophils Relative %: 67 %
Platelets: 230 10*3/uL (ref 150–400)
RBC: 4.91 MIL/uL (ref 4.22–5.81)
RDW: 12.9 % (ref 11.5–15.5)
WBC: 7 10*3/uL (ref 4.0–10.5)
nRBC: 0 % (ref 0.0–0.2)

## 2020-04-09 LAB — BRAIN NATRIURETIC PEPTIDE: B Natriuretic Peptide: 80 pg/mL (ref 0.0–100.0)

## 2020-04-09 LAB — TSH: TSH: 0.602 u[IU]/mL (ref 0.350–4.500)

## 2020-04-09 MED ORDER — PREDNISONE 10 MG PO TABS: ORAL_TABLET | ORAL | 0 refills | Status: DC

## 2020-04-09 MED ORDER — VALSARTAN 40 MG PO TABS: ORAL_TABLET | ORAL | Status: DC

## 2020-04-09 NOTE — Progress Notes (Signed)
Scott Hubbard, male    DOB: 06-30-1956, 64 y.o.   MRN: [id number]   Brief patient profile:  67 yowm quit smoking 04/2016 with onset of severe spell of sob and since then doe gradually worse despite rx with flovent/ stiolto so referred to pulmonary clinic 04/09/2020 by Dr   [name]     History of Present Illness  04/09/2020  Pulmonary/ 1st office eval/Scott Hubbard  Chief Complaint  Patient presents with  . Pulmonary Consult    Referred by Dr. [name] for eval of COPD. Former pt of Dr [name]. He states he gets SOB "when I do anything".    Dyspnea:  foodlion x 2 aisles but walks "faster than other people" (walked very slowly in office) so MMRC3 = can't walk 100 yards even at a slow pace at a flat grade s stopping due to sob    Last time mb and back was prior to prior to when he quit smoking as has long driveway with incline on way back  Cough: none Sleep: flat bed / one pillow L side s respiratory symptoms  SABA use: 2 pffs each am / does not know how when to use hfa vs neb       No obvious day to day or daytime variability or assoc excess/ purulent sputum or mucus plugs or hemoptysis or cp or chest tightness, subjective wheeze or overt sinus or hb symptoms.   Sleeping as above  without nocturnal  or early am exacerbation  of respiratory  c/o's or need for noct saba. Also denies any obvious fluctuation of symptoms with weather or environmental changes or other aggravating or alleviating factors except as outlined above   No unusual exposure hx or h/o childhood pna/ asthma or knowledge of premature birth.  Current Allergies, Complete Past Medical History, Past Surgical History, Family History, and Social History were reviewed in [employer] record.  ROS  The following are not active complaints unless bolded Hoarseness, sore throat, dysphagia, dental problems, itching, sneezing,  nasal congestion or discharge of excess mucus or purulent secretions, ear ache,   fever, chills,  sweats, unintended wt loss or wt gain, classically pleuritic or exertional cp,  orthopnea pnd or arm/hand swelling  or leg swelling, presyncope, palpitations, abdominal pain, anorexia, nausea, vomiting, diarrhea  or change in bowel habits or change in bladder habits, change in stools or change in urine, dysuria, hematuria,  rash, arthralgias, visual complaints, headache, numbness, weakness or ataxia or problems with walking or coordination,  change in mood or  memory.                                       Past Medical History:  Diagnosis Date  . Chest discomfort    negative stress echocardiogram-01/2011  . Chronic bronchitis   . COPD (chronic obstructive pulmonary disease) ([place])   . DJD (degenerative joint disease)   . Dyspnea   . Gastro-esophageal reflux disease with esophagitis   . GERD (gastroesophageal reflux disease)   . Hyperlipidemia   . Hypertension   . Migraine   . PVC (premature ventricular contraction)    Frequent  . Tobacco abuse   . Tobacco abuse    40 pack years    Outpatient Medications Prior to Visit  Medication Sig Dispense Refill  . albuterol (PROVENTIL HFA;VENTOLIN HFA) 108 (90 Base) MCG/ACT inhaler Inhale 2 puffs into the lungs every  4 (four) hours as needed for wheezing or shortness of breath. 1 Inhaler 0  . albuterol (PROVENTIL) (2.5 MG/3ML) 0.083% nebulizer solution Take 3 mLs (2.5 mg total) by nebulization every 6 (six) hours as needed for wheezing or shortness of breath. 150 mL 1  . fluticasone (FLOVENT HFA) 220 MCG/ACT inhaler Inhale into the lungs 2 (two) times daily.    . Tiotropium Bromide-Olodaterol (STIOLTO RESPIMAT) 2.5-2.5 MCG/ACT AERS Inhale 2 Act into the lungs daily. 4 g 5  . valsartan (DIOVAN) 40 MG tablet Take 1 tablet (40 mg total) by mouth daily. 90 tablet 1      Objective:     BP (!) 150/98 (BP Location: Left Arm, Cuff Size: Normal)   Pulse (!) 57   Temp (!) 97 F (36.1 C) (Temporal)   Ht 6\' 5"  (1.956 m)   Wt 197 lb (89.4 kg)   SpO2  100% Comment: on RA  BMI 23.36 kg/m   SpO2: 100 %(on RA)   amb wm  > stated age in appearance   Edentulous  HEENT : pt wearing mask not removed for exam due to covid -19 concerns.    NECK :  without JVD/Nodes/TM/ nl carotid upstrokes bilaterally   LUNGS: no acc muscle use,  Mod barrel  contour chest wall with bilateral  Insp/exp rhonchi  and  without cough on insp or exp maneuvers and mod  Hyperresonant  to  percussion bilaterally     CV:  RRR  no s3 or murmur or increase in P2, and no edema   ABD:  soft and nontender with pos mid insp Hoover's  in the supine position. No bruits or organomegaly appreciated, bowel sounds nl  MS:     ext warm without deformities, calf tenderness, cyanosis or clubbing No obvious joint restrictions   SKIN: warm and dry without lesions    NEURO:  alert, approp, nl sensorium with  no motor or cerebellar deficits apparent.        CXR PA and Lateral:   04/09/2020 :    I personally reviewed images and agree with radiology impression as follows:  Severe hyperinflation / no acute changes      Labs ordered 04/09/2020  :  allergy profile   alpha one AT phenotype     Labs ordered/ reviewed:      Chemistry      Component Value Date/Time   NA 135 04/09/2020 1125   K 4.4 04/09/2020 1125   CL 98 04/09/2020 1125   CO2 29 04/09/2020 1125   BUN 19 04/09/2020 1125   CREATININE 1.22 04/09/2020 1125   CREATININE 1.10 09/27/2019 1538      Component Value Date/Time   CALCIUM 8.8 (L) 04/09/2020 1125   ALKPHOS 76 01/12/2011 0000   AST 14 09/27/2019 1538   ALT 10 09/27/2019 1538   BILITOT 0.6 09/27/2019 1538        Lab Results  Component Value Date   WBC 7.0 04/09/2020   HGB 14.3 04/09/2020   HCT 44.3 04/09/2020   MCV 90.2 04/09/2020   PLT 230 04/09/2020        EOS                                                             0.1  04/09/2020       Lab Results  Component Value Date   TSH 0.602 04/09/2020      BNP   04/09/2020   = 80      CXR PA and Lateral:   04/09/2020 :    I personally reviewed images and agree with radiology impression as follows:  Severe hyperinflation / no acute changes     Assessment   DOE (dyspnea on exertion) 04/09/2020   Walked RA x two laps =  approx 260ft @ slow pace - stopped due to sob with sats of 97  % at the end of the study     difficult to sort out respiratory symptoms of unknown origin for which  DDX  = almost all start with A and  include Adherence, Ace Inhibitors, Acid Reflux, Active Sinus Disease, Alpha 1 Antitripsin deficiency, Anxiety masquerading as Airways dz,  ABPA,  Allergy(esp in young), Aspiration (esp in elderly), Adverse effects of meds,  Active smoking or Vaping, A bunch of PE's/clot burden (a few small clots can't cause this syndrome unless there is already severe underlying pulm or vascular dz with poor reserve),  Anemia or thyroid disorder, plus two Bs  = Bronchiectasis and Beta blocker use..and one C= CHF   Adherence is always the initial "prime suspect" and is a multilayered concern that requires a "trust but verify" approach in every patient - starting with knowing how to use medications, especially inhalers, correctly, keeping up with refills and understanding the fundamental difference between maintenance and prns vs those medications only taken for a very short course and then stopped and not refilled.  - see hfa teaching  - return with all meds in hand using a trust but verify approach to confirm accurate Medication  Reconciliation The principal here is that until we are certain that the  patients are doing what we've asked, it makes no sense to ask them to do more.   ? Allergy/ asthma > doubt with Eos 0.1 and  Already on max flovent ie 220 2bid so add prednisone x up to a month to see to what extent there is any reversible component here  >>> check IgE w/a   ? ABPA > check IgE   ? Adverse drug effects > none of the usual suspects listed    ? Anxiety/depression/ deconditioning  > usually at the bottom of this list of usual suspects  and may interfere with adherence and also interpretation of response or lack thereof to symptom management which can be quite subjective.    ? Alpha one AT def > send phenotype  ? Anemia/ thyroid dz > ruled out today  ? chf > nl bnp, cxr does not support     COPD GOLD IV/ Group D  Quit smoking 2017  - Spirometry 04/09/2020  FEV1 0.99 (23%)  Ratio 0.28 p ? Prior - 04/09/2020  After extensive coaching inhaler device,  effectiveness =    75% (short Ti) - 04/09/2020 rec pred 20 until better then 10 mg daily x one week and d/c   - Alpha one AT phenotype 04/09/2020     Group D in terms of symptom/risk and laba/lama/ICS  therefore appropriate rx at this point >>>  Continue stiolto/flovent 220 2bid for now since cost is 0 under present plan until have a chance to see to what extent improved technique helps here  And what part of his problem is reversible with systemic steroids.     Essential hypertension Not optimally  controlled on present regimen. I reviewed this with the patient and emphasized importance of follow-up with primary care/cards but in meantime rec increase diovan to 40 x 2 daily .   Each maintenance medication was reviewed in detail including emphasizing most importantly the difference between maintenance and prns and under what circumstances the prns are to be triggered using an action plan format where appropriate.  Total time for H and P, chart review, counseling, teaching device and generating customized AVS unique to this office visit / charting = 60 min          Christinia Gully, MD 04/09/2020

## 2020-04-09 NOTE — Assessment & Plan Note (Signed)
Not optimally controlled on present regimen. I reviewed this with the patient and emphasized importance of follow-up with primary care/cards but in meantime rec increase diovan to 40 x 2 daily .

## 2020-04-09 NOTE — Patient Instructions (Addendum)
Plan A = Automatic = Always=    Stiolto x 2 pffs/ flovent x 2puffs  first thing in am and repeat flovent in pm   Prednisone 10 mg x 2 daily until breathing better then 1 daily x a week and stop (#60)      Take valsartan 40 mg x 2 pills daily until you see your cardiologist             Plan B = Backup (to supplement plan A, not to replace it) Only use your albuterol inhaler as a rescue medication to be used if you can't catch your breath by resting or doing a relaxed purse lip breathing pattern.  - The less you use it, the better it will work when you need it. - Ok to use the inhaler up to 2 puffs  every 4 hours if you must but call for appointment if use goes up over your usual need - Don't leave home without it !!  (think of it like the spare tire for your car)    Please remember to go to the lab and x-ray department   for your tests - we will call you with the results when they are available.      Please schedule a follow up office visit in 4 weeks, sooner if needed  with all medications /inhalers/ solutions in hand so we can verify exactly what you are taking. This includes all medications from all doctors and over the counters

## 2020-04-09 NOTE — Assessment & Plan Note (Addendum)
Quit smoking 2017  - Spirometry 04/09/2020  FEV1 0.99 (23%)  Ratio 0.28 p ? Prior - 04/09/2020  After extensive coaching inhaler device,  effectiveness =    75% (short Ti) - 04/09/2020 rec pred 20 until better then 10 mg daily x one week and d/c   - Alpha one AT phenotype 04/09/2020     Group D in terms of symptom/risk and laba/lama/ICS  therefore appropriate rx at this point >>>  Continue stiolto/flovent 220 2bid for now since cost is 0 under present plan until have a chance to see to what extent improved technique helps here  And what part of his problem is reversible with systemic steroids.          Each maintenance medication was reviewed in detail including emphasizing most importantly the difference between maintenance and prns and under what circumstances the prns are to be triggered using an action plan format where appropriate.  Total time for H and P, chart review, counseling, teaching device and generating customized AVS unique to this office visit / charting = 60 min

## 2020-04-09 NOTE — Assessment & Plan Note (Addendum)
04/09/2020   Walked RA x two laps =  approx 253ft @ slow pace - stopped due to sob with sats of 97  % at the end of the study     difficult to sort out respiratory symptoms of unknown origin for which  DDX  = almost all start with A and  include Adherence, Ace Inhibitors, Acid Reflux, Active Sinus Disease, Alpha 1 Antitripsin deficiency, Anxiety masquerading as Airways dz,  ABPA,  Allergy(esp in young), Aspiration (esp in elderly), Adverse effects of meds,  Active smoking or Vaping, A bunch of PE's/clot burden (a few small clots can't cause this syndrome unless there is already severe underlying pulm or vascular dz with poor reserve),  Anemia or thyroid disorder, plus two Bs  = Bronchiectasis and Beta blocker use..and one C= CHF   Adherence is always the initial "prime suspect" and is a multilayered concern that requires a "trust but verify" approach in every patient - starting with knowing how to use medications, especially inhalers, correctly, keeping up with refills and understanding the fundamental difference between maintenance and prns vs those medications only taken for a very short course and then stopped and not refilled.  - see hfa teaching  - return with all meds in hand using a trust but verify approach to confirm accurate Medication  Reconciliation The principal here is that until we are certain that the  patients are doing what we've asked, it makes no sense to ask them to do more.   ? Allergy/ asthma > doubt with Eos 0.1 and  Already on max flovent ie 220 2bid so add prednisone x up to a month to see to what extent there is any reversible component here  >>> check IgE w/a   ? ABPA > check IgE   ? Adverse drug effects > none of the usual suspects listed   ? Anxiety/depression/ deconditioning  > usually at the bottom of this list of usual suspects  and may interfere with adherence and also interpretation of response or lack thereof to symptom management which can be quite subjective.    ? Alpha one AT def > send phenotype  ? Anemia/ thyroid dz > ruled out today  ? chf > nl bnp, cxr does not support

## 2020-04-11 LAB — ALPHA-1 ANTITRYPSIN PHENOTYPE: A-1 Antitrypsin, Ser: 156 mg/dL (ref 101–187)

## 2020-04-13 LAB — IGE: IgE (Immunoglobulin E), Serum: 131 IU/mL (ref 6–495)

## 2020-04-14 NOTE — Progress Notes (Signed)
Spoke with pt and notified of results per Dr. Wert. Pt verbalized understanding and denied any questions. 

## 2020-04-29 ENCOUNTER — Encounter: Payer: Self-pay | Admitting: Cardiology

## 2020-04-29 DIAGNOSIS — Z7189 Other specified counseling: Secondary | ICD-10-CM | POA: Insufficient documentation

## 2020-04-29 NOTE — Progress Notes (Signed)
Cardiology Office Note   Date:  04/30/2020   ID:  Osman, [place] [birth year], MRN [id number]  PCP:  Maryruth Hancock, MD  Cardiologist:   No primary care provider on file. Referring:  Maryruth Hancock, MD  Chief Complaint  Patient presents with  . PVCs      History of Present Illness: Scott Hubbard is a 64 y.o. male who is referred by Maryruth Hancock, MD for evaluation of ectopy on an EKG.   He saw Dr. Lattie Haw years ago for chest pain and had a negative stress echo.  He has no past cardiac history.    He has lung disease and is followed now by Dr.Wert.  He has a longstanding smoking history but he gave up cigarettes for years and 3 days ago.  He has recently been started on prednisone thinks is helping his chronic dyspnea.  He has had shortness of breath for years.  He says it is getting slowly more progressive.  However, he is not describing any rapid decline.  He is not describing new PND or orthopnea.  Has had no weight gain or edema.  He has a mild cough in the morning but has not had any fevers or chills.  He does not have any chest pressure, neck or arm discomfort.  He has had PVCs noted on EKG.  Years ago he apparently had some supraventricular tachycardia but he really does not describe any ongoing tachypalpitations or sustained arrhythmias or symptoms related to this.  He has had good activity such as get 1 into his house.  He pushes a mower to trim.  This gives him chronic dyspnea as described.  Past Medical History:  Diagnosis Date  . Chest discomfort    negative stress echocardiogram-01/2011  . Chronic bronchitis   . COPD (chronic obstructive pulmonary disease) ([place])   . DJD (degenerative joint disease)   . Gastro-esophageal reflux disease with esophagitis   . GERD (gastroesophageal reflux disease)   . Hyperlipidemia   . Hypertension   . Migraine   . PVC (premature ventricular contraction)    Frequent  . Tobacco abuse    40 pack years    Past Surgical History:    Procedure Laterality Date  . None       Current Outpatient Medications  Medication Sig Dispense Refill  . albuterol (PROVENTIL HFA;VENTOLIN HFA) 108 (90 Base) MCG/ACT inhaler Inhale 2 puffs into the lungs every 4 (four) hours as needed for wheezing or shortness of breath. 1 Inhaler 0  . albuterol (PROVENTIL) (2.5 MG/3ML) 0.083% nebulizer solution Take 3 mLs (2.5 mg total) by nebulization every 6 (six) hours as needed for wheezing or shortness of breath. 150 mL 1  . fluticasone (FLOVENT HFA) 220 MCG/ACT inhaler Inhale into the lungs 2 (two) times daily.    . predniSONE (DELTASONE) 10 MG tablet Take 2 daily until breathing better then 1 daily x one week and stop 60 tablet 0  . Tiotropium Bromide-Olodaterol (STIOLTO RESPIMAT) 2.5-2.5 MCG/ACT AERS Inhale 2 Act into the lungs daily. 4 g 5  . valsartan (DIOVAN) 40 MG tablet 2 daily (Patient taking differently: Take 1 tablet twice daily)     No current facility-administered medications for this visit.    Allergies:   Patient has no known allergies.    Social History:  The patient  reports that he quit smoking about 4 years ago. He has a 60.00 pack-year smoking history. He has never used smokeless tobacco. He  reports that he does not drink alcohol or use drugs.  He has been with his girlfriend for about 21 years.  He lives with her.  Family History: There is no reported strong family history of early coronary artery disease.  Is not clear what his parents died from however.   ROS:  Please see the history of present illness.   Otherwise, review of systems are positive for none.   All other systems are reviewed and negative.    PHYSICAL EXAM: VS:  BP 120/80   Pulse 76   Ht 6\' 5"  (1.956 m)   Wt 197 lb (89.4 kg)   BMI 23.36 kg/m  , BMI Body mass index is 23.36 kg/m. GENERAL:  Well appearing HEENT:  Pupils equal round and reactive, fundi not visualized, oral mucosa unremarkable NECK:  No jugular venous distention, waveform within normal  limits, carotid upstroke brisk and symmetric, no bruits, no thyromegaly LYMPHATICS:  No cervical, inguinal adenopathy LUNGS: Decreased breath sounds with scattered wheezes BACK:  No CVA tenderness CHEST:  Unremarkable HEART:  PMI not displaced or sustained,S1 and S2 within normal limits, no S3, no S4, no clicks, no rubs, no murmurs ABD:  Flat, positive bowel sounds normal in frequency in pitch, no bruits, no rebound, no guarding, no midline pulsatile mass, no hepatomegaly, no splenomegaly EXT:  2 plus pulses throughout, no edema, no cyanosis no clubbing SKIN:  No rashes no nodules, large lipoma on his right upper back near his axilla NEURO:  Cranial nerves II through XII grossly intact, motor grossly intact throughout PSYCH:  Cognitively intact, oriented to person place and time    EKG:  EKG is not ordered today. The ekg ordered 03/27/20 demonstrates sinus rhythm, rate 66, axis within normal limits, intervals within normal limits, no acute ST-T wave changes.   Recent Labs: 09/27/2019: ALT 10 04/09/2020: B Natriuretic Peptide 80.0; BUN 19; Creatinine, Ser 1.22; Hemoglobin 14.3; Platelets 230; Potassium 4.4; Sodium 135; TSH 0.602    Lipid Panel    Component Value Date/Time   CHOL 207 01/12/2011 0000   TRIG 106 01/12/2011 0000   HDL 38 01/12/2011 0000   LDLCALC 21 01/12/2011 0000      Wt Readings from Last 3 Encounters:  04/30/20 197 lb (89.4 kg)  04/09/20 197 lb (89.4 kg)  03/27/20 193 lb 12.8 oz (87.9 kg)      Other studies Reviewed: Additional studies/ records that were reviewed today include: Previous stress echo. Review of the above records demonstrates:  Please see elsewhere in the note.     ASSESSMENT AND PLAN:    PVCs:    Patient was referred for evaluation of PVCs.  However, is not really feeling these.  His exam is unremarkable.  At this point I am not suggesting further cardiovascular testing.  He had a normal TSH and electrolytes recently.    HTN:   The blood  pressure is well controlled.  No change in therapy.  DYSPNEA: His dyspnea seems to be improving with steroids.  He certainly has an abnormal lung exam and I would suggest that his symptoms are related to this.  He did have a work-up some years ago without evidence of ischemia and has no active symptoms final think further ischemia testing is indicated unless his symptoms worsen despite optimal pulmonary therapies.  COVID EDUCATION: He is going to get the Covid vaccine.  Current medicines are reviewed at length with the patient today.  The patient does not have concerns regarding medicines.  The following changes have been made:  no change  Labs/ tests ordered today include: None No orders of the defined types were placed in this encounter.    Disposition:   FU with me as needed.     Signed, Minus Breeding, MD  04/30/2020 5:01 PM    [facility]

## 2020-04-30 ENCOUNTER — Encounter: Payer: Self-pay | Admitting: Cardiology

## 2020-04-30 ENCOUNTER — Other Ambulatory Visit: Payer: Self-pay

## 2020-04-30 ENCOUNTER — Ambulatory Visit (INDEPENDENT_AMBULATORY_CARE_PROVIDER_SITE_OTHER): Payer: Medicare Other | Admitting: Cardiology

## 2020-04-30 VITALS — BP 120/80 | HR 76 | Ht 77.0 in | Wt 197.0 lb

## 2020-04-30 DIAGNOSIS — E785 Hyperlipidemia, unspecified: Secondary | ICD-10-CM | POA: Diagnosis not present

## 2020-04-30 DIAGNOSIS — Z7189 Other specified counseling: Secondary | ICD-10-CM

## 2020-04-30 DIAGNOSIS — I10 Essential (primary) hypertension: Secondary | ICD-10-CM | POA: Diagnosis not present

## 2020-04-30 DIAGNOSIS — R072 Precordial pain: Secondary | ICD-10-CM | POA: Diagnosis not present

## 2020-04-30 NOTE — Patient Instructions (Signed)
Medication Instructions:  The current medical regimen is effective;  continue present plan and medications.  *If you need a refill on your cardiac medications before your next appointment, please call your pharmacy*  Follow-Up: At [facility], you and your health needs are our priority.  As part of our continuing mission to provide you with exceptional heart care, we have created designated Provider Care Teams.  These Care Teams include your primary Cardiologist (physician) and Advanced Practice Providers (APPs -  Physician Assistants and Nurse Practitioners) who all work together to provide you with the care you need, when you need it.  We recommend signing up for the patient portal called "MyChart".  Sign up information is provided on this After Visit Summary.  MyChart is used to connect with patients for Virtual Visits (Telemedicine).  Patients are able to view lab/test results, encounter notes, upcoming appointments, etc.  Non-urgent messages can be sent to your provider as well.   To learn more about what you can do with MyChart, go to [email or web address].    Thank you for choosing [facility]!!     

## 2020-05-01 ENCOUNTER — Other Ambulatory Visit: Payer: Self-pay | Admitting: Internal Medicine

## 2020-05-09 ENCOUNTER — Encounter: Payer: Self-pay | Admitting: Internal Medicine

## 2020-05-09 ENCOUNTER — Other Ambulatory Visit: Payer: Self-pay

## 2020-05-09 ENCOUNTER — Ambulatory Visit (INDEPENDENT_AMBULATORY_CARE_PROVIDER_SITE_OTHER): Payer: Medicare Other | Admitting: Internal Medicine

## 2020-05-09 DIAGNOSIS — J449 Chronic obstructive pulmonary disease, unspecified: Secondary | ICD-10-CM

## 2020-05-09 MED ORDER — STIOLTO RESPIMAT 2.5-2.5 MCG/ACT IN AERS: 2.0000 | INHALATION_SPRAY | Freq: Every day | RESPIRATORY_TRACT | 11 refills | Status: DC

## 2020-05-09 NOTE — Patient Instructions (Addendum)
Try prednisone 10 mg one half daily x one week and stop   No change in medications otherwise   Please schedule a follow up visit in 3 months but call sooner if needed with pfts on return

## 2020-05-09 NOTE — Progress Notes (Signed)
Scott Hubbard, male    DOB: [birth year], 64 y.o.   MRN: [id number]   Brief patient profile:  60 yowm quit smoking 04/2016 @ onset of severe spell of sob and since then doe gradually worse despite rx with flovent/ stiolto so referred to pulmonary clinic 04/09/2020 by Dr   Scott Hubbard     History of Present Illness  04/09/2020  Pulmonary/ 1st office eval/Scott Hubbard  Chief Complaint  Patient presents with  . Pulmonary Consult    Referred by Dr. Benny Hubbard for eval of COPD. Former pt of Dr Scott Hubbard. He states he gets SOB "when I do anything".    Dyspnea:  [employer] x 2 aisles but walks "faster than other people" (walked very slowly in office) so MMRC3 = can't walk 100 yards even at a slow pace at a flat grade s stopping due to sob    Last time mb and back was prior to prior to when he quit smoking as has long driveway with incline on way back  Cough: none Sleep: flat bed / one pillow L side s respiratory symptoms  SABA use: 2 pffs each am / does not know how when to use hfa vs neb     rec Plan A = Automatic = Always=    Stiolto x 2 pffs/ flovent x 2puffs  first thing in am and repeat flovent in pm  Prednisone 10 mg x 2 daily until breathing better then 1 daily x a week and stop (#60)     Take valsartan 40 mg x 2 pills daily until you see your cardiologist            Plan B = Backup (to supplement plan A, not to replace it) Only use your albuterol inhaler as a rescue medication    05/09/2020  f/u ov/Scott Hubbard re: GOLD IV/ group D symptom/risk still on pred 10 mg daily  Chief Complaint  Patient presents with  . Follow-up    Breathing is some better since the last visit. She is using her albuterol inhaler 1-2 x per day and has not needed neb.   Dyspnea:  Walking at [employer] easier now/ def better p pred x 6 days  Cough: none Sleeping: on side/ one pillow  SABA use: less 02: none    No obvious day to day or daytime variability or assoc excess/ purulent sputum or mucus plugs or hemoptysis or cp or chest  tightness, subjective wheeze or overt sinus or hb symptoms.   Sleeping  without nocturnal  or early am exacerbation  of respiratory  c/o's or need for noct saba. Also denies any obvious fluctuation of symptoms with weather or environmental changes or other aggravating or alleviating factors except as outlined above   No unusual exposure hx or h/o childhood pna/ asthma or knowledge of premature birth.  Current Allergies, Complete Past Medical History, Past Surgical History, Family History, and Social History were reviewed in [employer] record.  ROS  The following are not active complaints unless bolded Hoarseness, sore throat, dysphagia, dental problems, itching, sneezing,  nasal congestion or discharge of excess mucus or purulent secretions, ear ache,   fever, chills, sweats, unintended wt loss or wt gain, classically pleuritic or exertional cp,  orthopnea pnd or arm/hand swelling  or leg swelling, presyncope, palpitations, abdominal pain, anorexia, nausea, vomiting, diarrhea  or change in bowel habits or change in bladder habits, change in stools or change in urine, dysuria, hematuria,  rash, arthralgias, visual complaints,  headache, numbness, weakness or ataxia or problems with walking or coordination,  change in mood or  memory.        Current Meds  Medication Sig  . albuterol (PROVENTIL HFA;VENTOLIN HFA) 108 (90 Base) MCG/ACT inhaler Inhale 2 puffs into the lungs every 4 (four) hours as needed for wheezing or shortness of breath.  Marland Kitchen albuterol (PROVENTIL) (2.5 MG/3ML) 0.083% nebulizer solution Take 3 mLs (2.5 mg total) by nebulization every 6 (six) hours as needed for wheezing or shortness of breath.  . fluticasone (FLOVENT HFA) 220 MCG/ACT inhaler Inhale into the lungs 2 (two) times daily.  . predniSONE (DELTASONE) 10 MG tablet TAKE 2 TABLETS DAILY UNTIL BREATHING BETTER THEN TAKE 1 TAB DAILY FOR ONE WEEK AND STOP  . valsartan (DIOVAN) 40 MG tablet 2 daily (Patient  taking differently: Take 1 tablet twice daily)                                                 Past Medical History:  Diagnosis Date  . Chest discomfort    negative stress echocardiogram-01/2011  . Chronic bronchitis   . COPD (chronic obstructive pulmonary disease) ([place])   . DJD (degenerative joint disease)   . Dyspnea   . Gastro-esophageal reflux disease with esophagitis   . GERD (gastroesophageal reflux disease)   . Hyperlipidemia   . Hypertension   . Migraine   . PVC (premature ventricular contraction)    Frequent  . Tobacco abuse   . Tobacco abuse    40 pack years       Objective:     Wt Readings from Last 3 Encounters:  05/09/20 197 lb (89.4 kg)  04/30/20 197 lb (89.4 kg)  04/09/20 197 lb (89.4 kg)     Vital signs reviewed - Note on arrival 02 sats  100% on RA      amb wm nad  Edentulous   HEENT : pt wearing mask not removed for exam due to covid -19 concerns.    NECK :  without JVD/Nodes/TM/ nl carotid upstrokes bilaterally   LUNGS: no acc muscle use,  Mod barrel  contour chest wall with bilateral  Distant bs s audible wheeze and  without cough on insp or exp maneuvers and mod  Hyperresonant  to  percussion bilaterally     CV:  RRR  no s3 or murmur or increase in P2, and no edema   ABD:  soft and nontender with pos mid insp Hoover's  in the supine position. No bruits or organomegaly appreciated, bowel sounds nl  MS:     ext warm without deformities, calf tenderness, cyanosis or clubbing No obvious joint restrictions   SKIN: warm and dry without lesions    NEURO:  alert, approp, nl sensorium with  no motor or cerebellar deficits apparent.               Assessment

## 2020-05-10 ENCOUNTER — Encounter: Payer: Self-pay | Admitting: Internal Medicine

## 2020-05-10 NOTE — Assessment & Plan Note (Addendum)
Quit smoking 2017  - Spirometry  01/04/17  FEV1 0.99 (23%)  Ratio 0.28 p ? Prior  = Allergy profile 04/09/20  >  Eos 0. 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 04/09/2020  MM   Level 156  - 04/09/2020 rec pred 20 until better then 10 mg daily x one week wean - 05/09/2020  After extensive coaching inhaler device,  effectiveness =    90% > rec  continue flovent 220/ stiolto and completely  taper off prednisone    Group D in terms of symptom/risk and laba/lama/ICS  therefore appropriate rx at this point >>>  Continue ICS and Lama/laba since insurance covers this combination   The goal with a chronic steroid dependent illness is always arriving at the lowest effective dose that controls the disease/symptoms and not accepting a set "formula" which is based on statistics or guidelines that don't always take into account patient  variability or the natural hx of the dz in every individual patient, which may well vary over time.  For now therefore I recommend the patient wean completely off pred and see if maintains same benefit from max topical rx in from of flovent 220 2bid   >>> f/u q 34m, sooner if needed          Each maintenance medication was reviewed in detail including emphasizing most importantly the difference between maintenance and prns and under what circumstances the prns are to be triggered using an action plan format where appropriate.  Total time for H and P, chart review, counseling, teaching device and generating customized AVS unique to this office visit / charting = 20 min

## 2020-05-22 ENCOUNTER — Other Ambulatory Visit: Payer: Self-pay | Admitting: Internal Medicine

## 2020-05-22 MED ORDER — FLUTICASONE PROPIONATE HFA 220 MCG/ACT IN AERO: 2.0000 | INHALATION_SPRAY | Freq: Two times a day (BID) | RESPIRATORY_TRACT | 11 refills | Status: DC

## 2020-06-09 NOTE — Progress Notes (Signed)
I called patient this morning and this afternoon. No answer or answering machine. Will try again later. I was able to reach patient this morning. He said he would be able to come around 2:10. Nothing further needed.

## 2020-06-20 ENCOUNTER — Other Ambulatory Visit: Payer: Self-pay

## 2020-06-20 ENCOUNTER — Other Ambulatory Visit (HOSPITAL_COMMUNITY)
Admission: RE | Admit: 2020-06-20 | Discharge: 2020-06-20 | Disposition: A | Discharge status: Home / Self Care | Payer: Medicare Other | Source: Ambulatory Visit | Attending: Internal Medicine | Admitting: Internal Medicine

## 2020-06-20 DIAGNOSIS — Z01812 Encounter for preprocedural laboratory examination: Secondary | ICD-10-CM | POA: Insufficient documentation

## 2020-06-20 DIAGNOSIS — Z20822 Contact with and (suspected) exposure to covid-19: Secondary | ICD-10-CM | POA: Diagnosis not present

## 2020-06-20 LAB — SARS CORONAVIRUS 2 (TAT 6-24 HRS): SARS Coronavirus 2: NEGATIVE

## 2020-06-24 ENCOUNTER — Ambulatory Visit (HOSPITAL_COMMUNITY)
Admission: RE | Admit: 2020-06-24 | Discharge: 2020-06-24 | Disposition: A | Discharge status: Home / Self Care | Payer: Medicare Other | Source: Ambulatory Visit | Attending: Internal Medicine | Admitting: Internal Medicine

## 2020-06-24 ENCOUNTER — Other Ambulatory Visit: Payer: Self-pay

## 2020-06-24 ENCOUNTER — Telehealth: Payer: Self-pay | Admitting: Internal Medicine

## 2020-06-24 DIAGNOSIS — J449 Chronic obstructive pulmonary disease, unspecified: Secondary | ICD-10-CM | POA: Diagnosis not present

## 2020-06-24 LAB — PULMONARY FUNCTION TEST
DL/VA % pred: 46 %
DL/VA: 1.88 ml/min/mmHg/L
DLCO unc % pred: 35 %
DLCO unc: 11.45 ml/min/mmHg
FEF 25-75 Post: 0.51 L/sec
FEF 25-75 Pre: 0.25 L/sec
FEF2575-%Change-Post: 102 %
FEF2575-%Pred-Post: 15 %
FEF2575-%Pred-Pre: 7 %
FEV1-%Change-Post: 35 %
FEV1-%Pred-Post: 26 %
FEV1-%Pred-Pre: 19 %
FEV1-Post: 1.15 L
FEV1-Pre: 0.85 L
FEV1FVC-%Change-Post: 1 %
FEV1FVC-%Pred-Pre: 36 %
FEV6-%Change-Post: 36 %
FEV6-%Pred-Post: 53 %
FEV6-%Pred-Pre: 39 %
FEV6-Post: 2.97 L
FEV6-Pre: 2.17 L
FEV6FVC-%Change-Post: 1 %
FEV6FVC-%Pred-Post: 73 %
FEV6FVC-%Pred-Pre: 72 %
FVC-%Change-Post: 33 %
FVC-%Pred-Post: 72 %
FVC-%Pred-Pre: 54 %
FVC-Post: 4.22 L
FVC-Pre: 3.15 L
Post FEV1/FVC ratio: 27 %
Post FEV6/FVC ratio: 70 %
Pre FEV1/FVC ratio: 27 %
Pre FEV6/FVC Ratio: 69 %
RV % pred: 321 %
RV: 8.86 L
TLC % pred: 144 %
TLC: 12.15 L

## 2020-06-24 MED ORDER — ALBUTEROL SULFATE (2.5 MG/3ML) 0.083% IN NEBU
2.5000 mg | INHALATION_SOLUTION | Freq: Once | RESPIRATORY_TRACT | Status: AC
  Administered 2020-06-24: 2.5 mg via RESPIRATORY_TRACT

## 2020-06-24 MED ORDER — ALBUTEROL SULFATE HFA 108 (90 BASE) MCG/ACT IN AERS: 2.0000 | INHALATION_SPRAY | RESPIRATORY_TRACT | 0 refills | Status: DC | PRN

## 2020-06-24 NOTE — Telephone Encounter (Signed)
ATC pt . Unable to leave message. Will call back. Let pt know that he has 11 refills on file for Flovent at [facility] in [place] and he should be able to call them to get that refilled. Proair refill has been sent to pharmacy.

## 2020-06-27 NOTE — Telephone Encounter (Signed)
ATC pt, there was no answer and no option to leave a message. Will try back. 

## 2020-07-01 NOTE — Telephone Encounter (Signed)
ATC pt, there was no answer and no option to leave a message. We have attempted to contact pt several times with no success or call back from pt. Per triage protocol, message will be closed.  

## 2020-07-18 ENCOUNTER — Other Ambulatory Visit: Payer: Self-pay | Admitting: Family Medicine

## 2020-07-18 DIAGNOSIS — I10 Essential (primary) hypertension: Secondary | ICD-10-CM

## 2020-07-29 ENCOUNTER — Other Ambulatory Visit: Payer: Self-pay | Admitting: Internal Medicine

## 2020-09-08 ENCOUNTER — Other Ambulatory Visit: Payer: Self-pay

## 2020-09-08 ENCOUNTER — Encounter: Payer: Self-pay | Admitting: Internal Medicine

## 2020-09-08 ENCOUNTER — Ambulatory Visit (INDEPENDENT_AMBULATORY_CARE_PROVIDER_SITE_OTHER): Payer: Medicare Other | Admitting: Internal Medicine

## 2020-09-08 DIAGNOSIS — I10 Essential (primary) hypertension: Secondary | ICD-10-CM

## 2020-09-08 DIAGNOSIS — J449 Chronic obstructive pulmonary disease, unspecified: Secondary | ICD-10-CM | POA: Diagnosis not present

## 2020-09-08 MED ORDER — BREZTRI AEROSPHERE 160-9-4.8 MCG/ACT IN AERO: 2.0000 | INHALATION_SPRAY | Freq: Two times a day (BID) | RESPIRATORY_TRACT | 0 refills | Status: DC

## 2020-09-08 MED ORDER — PREDNISONE 10 MG PO TABS: ORAL_TABLET | ORAL | 0 refills | Status: DC

## 2020-09-08 MED ORDER — BREZTRI AEROSPHERE 160-9-4.8 MCG/ACT IN AERO: 2.0000 | INHALATION_SPRAY | Freq: Two times a day (BID) | RESPIRATORY_TRACT | 11 refills | Status: DC

## 2020-09-08 MED ORDER — VALSARTAN 80 MG PO TABS: 80.0000 mg | ORAL_TABLET | Freq: Every day | ORAL | 11 refills | Status: DC

## 2020-09-08 NOTE — Patient Instructions (Addendum)
Plan A = Automatic = Always=    Breztri Take 2 puffs first thing in am and then another 2 puffs about 12 hours later. (replacing the flovent and stiolto)  Prednisone 10 mg 2 daily until better then 1 daily x one week stop   Plan B = Backup (to supplement plan A, not to replace it) Only use your albuterol inhaler as a rescue medication to be used if you can't catch your breath by resting or doing a relaxed purse lip breathing pattern.  - The less you use it, the better it will work when you need it. - Ok to use the inhaler up to 2 puffs  every 4 hours if you must but call for appointment if use goes up over your usual need - Don't leave home without it !!  (think of it like the spare tire for your car)   Plan C = Crisis (instead of Plan B but only if Plan B stops working) - only use your albuterol nebulizer if you first try Plan B and it fails to help > ok to use the nebulizer up to every 4 hours but if start needing it regularly call for immediate appointment  Increase valsartan to 40 mg x 2 = 80 mg tablets on daily and avoid excess salt if possible    Please schedule a follow up office visit in 4 weeks, sooner if needed

## 2020-09-08 NOTE — Progress Notes (Signed)
Scott Hubbard, male    DOB: [birth year]    MRN: [id number]   Brief patient profile:  34 yowm MM/quit smoking 04/2016 @ onset of severe spell of sob and since then doe gradually worse despite rx with flovent/ stiolto so referred to pulmonary clinic 04/09/2020 by Dr   Scott Hubbard     History of Present Illness  04/09/2020  Pulmonary/ 1st office eval/Maron Hubbard  Chief Complaint  Patient presents with   Pulmonary Consult    Referred by Dr. Benny Hubbard for eval of COPD. Former pt of Dr Scott Hubbard. He states he gets SOB "when I do anything".    Dyspnea:  [employer] x 2 aisles but walks "faster than other people" (walked very slowly in office) so MMRC3 = can't walk 100 yards even at a slow pace at a flat grade s stopping due to sob    Last time mb and back was prior to prior to when he quit smoking as has long driveway with incline on way back  Cough: none Sleep: flat bed / one pillow L side s respiratory symptoms  SABA use: 2 pffs each am / does not know how when to use hfa vs neb     rec Plan A = Automatic = Always=    Stiolto x 2 pffs/ flovent x 2puffs  first thing in am and repeat flovent in pm  Prednisone 10 mg x 2 daily until breathing better then 1 daily x a week and stop (#60)     Take valsartan 40 mg x 2 pills daily until you see your cardiologist            Plan B = Backup (to supplement plan A, not to replace it) Only use your albuterol inhaler as a rescue medication    05/09/2020  f/u ov/[name] re: GOLD IV/ group D symptom/risk still on pred 10 mg daily  Chief Complaint  Patient presents with   Follow-up    Breathing is some better since the last visit. She is using her albuterol inhaler 1-2 x per day and has not needed neb.   Dyspnea:  Walking at [employer] easier now/ def better p pred x 6 days  Cough: none Sleeping: on side/ one pillow  SABA use: less 02: none        rec Try prednisone 10 mg one half daily x one week and stop    09/08/2020  f/u ov/Scott Hubbard re: GOLD 4 copd on flovent and stiolto   Chief Complaint  Patient presents with   Follow-up    Patient feels like his breathing is worse since last visit, shortness of breath with exertion, productive cough with white sputum, spouse states he eats salt like crazy.    Dyspnea:   Walking at [employer] x only  one aisle  = MMRC3 = can't walk 100 yards even at a slow pace at a flat grade s stopping due to sob   Cough: usual worse in am / mucoid  Sleeping: flat bed one pillow prefer L side down  SABA use: rarely  02: none   No obvious day to day or daytime variability or assoc  purulent sputum or mucus plugs or hemoptysis or cp or chest tightness, subjective wheeze or overt sinus or hb symptoms.   Sleeping as above  without nocturnal  or early am exacerbation  of respiratory  c/o's or need for noct saba. Also denies any obvious fluctuation of symptoms with weather or environmental changes or other  aggravating or alleviating factors except as outlined above   No unusual exposure hx or h/o childhood pna/ asthma or knowledge of premature birth.  Current Allergies, Complete Past Medical History, Past Surgical History, Family History, and Social History were reviewed in [employer] record.  ROS  The following are not active complaints unless bolded Hoarseness, sore throat, dysphagia, dental problems, itching, sneezing,  nasal congestion or discharge of excess mucus or purulent secretions, ear ache,   fever, chills, sweats, unintended wt loss or wt gain, classically pleuritic or exertional cp,  orthopnea pnd or arm/hand swelling  or leg swelling, presyncope, palpitations, abdominal pain, anorexia, nausea, vomiting, diarrhea  or change in bowel habits or change in bladder habits, change in stools or change in urine, dysuria, hematuria,  rash, arthralgias, visual complaints, headache, numbness, weakness or ataxia or problems with walking or coordination,  change in mood or  memory.        Current Meds  Medication Sig    albuterol (VENTOLIN HFA) 108 (90 Base) MCG/ACT inhaler INHALE 2 PUFFS INTO THE LUNGS EVERY 4 (FOUR) HOURS AS NEEDED FOR WHEEZING OR SHORTNESS OF BREATH.   fluticasone (FLOVENT HFA) 220 MCG/ACT inhaler Inhale 2 puffs into the lungs 2 (two) times daily.   Tiotropium Bromide-Olodaterol (STIOLTO RESPIMAT) 2.5-2.5 MCG/ACT AERS Inhale 2 puffs into the lungs daily.   valsartan (DIOVAN) 40 MG tablet 2 daily (Patient taking differently: Take 1 tablet twice daily) (0nly taking one)                                                 Past Medical History:  Diagnosis Date   Chest discomfort    negative stress echocardiogram-01/2011   Chronic bronchitis    COPD (chronic obstructive pulmonary disease) ([facility])    DJD (degenerative joint disease)    Dyspnea    Gastro-esophageal reflux disease with esophagitis    GERD (gastroesophageal reflux disease)    Hyperlipidemia    Hypertension    Migraine    PVC (premature ventricular contraction)    Frequent   Tobacco abuse    Tobacco abuse    40 pack years       Objective:           09/08/2020       195   05/09/20 197 lb (89.4 kg)  04/30/20 197 lb (89.4 kg)  04/09/20 197 lb (89.4 kg)     amb mod obese wm nad   Vital signs reviewed  09/08/2020  - Note at rest 02 sats  98% on RA and bp    180/94 on half the dose of valsartan rec by med list    Edentulous   HEENT : pt wearing mask not removed for exam due to covid -19 concerns.    NECK :  without JVD/Nodes/TM/ nl carotid upstrokes bilaterally   LUNGS: no acc muscle use,  Mod barrel  contour chest wall with bilateral  Distant bs s audible wheeze and  without cough on insp or exp maneuvers and mod  Hyperresonant  to  percussion bilaterally     CV:  RRR  no s3 or murmur or increase in P2, and no edema   ABD:  soft and nontender with pos mid insp Hoover's  in the supine position. No bruits or organomegaly appreciated, bowel sounds nl  MS:  ext warm without deformities, calf  tenderness, cyanosis or clubbing No obvious joint restrictions   SKIN: warm and dry without lesions    NEURO:  alert, approp, nl sensorium with  no motor or cerebellar deficits apparent.                Assessment

## 2020-09-09 ENCOUNTER — Encounter: Payer: Self-pay | Admitting: Internal Medicine

## 2020-09-09 NOTE — Assessment & Plan Note (Addendum)
Quit smoking 2017  - Spirometry  01/04/17  FEV1 0.99 (23%)  Ratio 0.28 p ? Prior  - Allergy profile 04/09/20  >  Eos 0. 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 04/09/2020  MM   Level 156  - 04/09/2020 rec pred 20 until better then 10 mg daily x one week wean - 05/09/2020  After extensive coaching inhaler device,  effectiveness =    90% > rec  continue flovent 220/ stiolto and completely  taper off prednisone  - PFT's  06/24/20  FEV1 1.15 (26 % ) ratio 0.27  p 35 % improvement from saba p stiolto prior to study with DLCO  11.45 (35%) corrects to 1.88 (46%)  for alv volume and FV curve classic curvature  - 09/08/2020  After extensive coaching inhaler device,  effectiveness =    90% so try breztri    Group D in terms of symptom/risk and laba/lama/ICS  therefore appropriate rx at this point >>>  Try breztri samples x 4 weeks p short course prednisone  then regroup   Re saba I spent extra time with pt today reviewing appropriate use of albuterol for prn use on exertion with the following points: 1) saba is for relief of sob that does not improve by walking a slower pace or resting but rather if the pt does not improve after trying this first. 2) If the pt is convinced, as many are, that saba helps recover from activity faster then it's easy to tell if this is the case by re-challenging : ie stop, take the inhaler, then p 5 minutes try the exact same activity (intensity of workload) that just caused the symptoms and see if they are substantially diminished or not after saba 3) if there is an activity that reproducibly causes the symptoms, try the saba 15 min before the activity on alternate days   If in fact the saba really does help, then fine to continue to use it prn but advised may need to look closer at the maintenance regimen being used to achieve better control of airways disease with exertion.           Each maintenance medication was reviewed in detail including emphasizing most importantly the  difference between maintenance and prns and under what circumstances the prns are to be triggered using an action plan format where appropriate.  Total time for H and P, chart review, counseling, teaching device and generating customized AVS unique to this office visit / charting = 22 min

## 2020-09-09 NOTE — Assessment & Plan Note (Signed)
Up likely due to non adherence and excess  salt use  >>>>Advised to take both valsartan's as rec per med list but when refill change to valsartan 80 mg daily and avoid salt > may need hctz added later but defer to PCP

## 2020-10-09 ENCOUNTER — Ambulatory Visit (INDEPENDENT_AMBULATORY_CARE_PROVIDER_SITE_OTHER): Payer: Medicare Other | Admitting: Internal Medicine

## 2020-10-09 ENCOUNTER — Encounter: Payer: Self-pay | Admitting: Internal Medicine

## 2020-10-09 ENCOUNTER — Other Ambulatory Visit: Payer: Self-pay

## 2020-10-09 VITALS — BP 162/98 | HR 80 | Resp 18 | Ht 77.0 in | Wt 204.0 lb

## 2020-10-09 DIAGNOSIS — J449 Chronic obstructive pulmonary disease, unspecified: Secondary | ICD-10-CM | POA: Diagnosis not present

## 2020-10-09 DIAGNOSIS — R7303 Prediabetes: Secondary | ICD-10-CM

## 2020-10-09 DIAGNOSIS — M62838 Other muscle spasm: Secondary | ICD-10-CM

## 2020-10-09 DIAGNOSIS — K219 Gastro-esophageal reflux disease without esophagitis: Secondary | ICD-10-CM | POA: Diagnosis not present

## 2020-10-09 DIAGNOSIS — Z7689 Persons encountering health services in other specified circumstances: Secondary | ICD-10-CM | POA: Diagnosis not present

## 2020-10-09 DIAGNOSIS — I10 Essential (primary) hypertension: Secondary | ICD-10-CM

## 2020-10-09 MED ORDER — VALSARTAN-HYDROCHLOROTHIAZIDE 160-12.5 MG PO TABS: 1.0000 | ORAL_TABLET | Freq: Every day | ORAL | 11 refills | Status: DC

## 2020-10-09 MED ORDER — CYCLOBENZAPRINE HCL 10 MG PO TABS: 10.0000 mg | ORAL_TABLET | Freq: Two times a day (BID) | ORAL | 2 refills | Status: DC | PRN

## 2020-10-09 MED ORDER — PANTOPRAZOLE SODIUM 40 MG PO TBEC: 40.0000 mg | DELAYED_RELEASE_TABLET | Freq: Every day | ORAL | 2 refills | Status: DC

## 2020-10-09 MED ORDER — STIOLTO RESPIMAT 2.5-2.5 MCG/ACT IN AERS: 2.0000 | INHALATION_SPRAY | Freq: Every day | RESPIRATORY_TRACT | 11 refills | Status: DC

## 2020-10-09 MED ORDER — STIOLTO RESPIMAT 2.5-2.5 MCG/ACT IN AERS: 2.0000 | INHALATION_SPRAY | Freq: Every day | RESPIRATORY_TRACT | 0 refills | Status: DC

## 2020-10-09 MED ORDER — ALBUTEROL SULFATE (2.5 MG/3ML) 0.083% IN NEBU: 2.5000 mg | INHALATION_SOLUTION | RESPIRATORY_TRACT | 12 refills | Status: DC | PRN

## 2020-10-09 NOTE — Patient Instructions (Signed)
Please continue to use inhalers as suggested by Dr Melvyn Novas.  Please get chest X-ray done as scheduled by Dr Melvyn Novas. Please start taking Flexeril for muscle spasms. Please start taking Valsartan-HCTZ for blood pressure. Please follow DASH diet for better control of blood pressure.  DASH stands for Dietary Approaches to Stop Hypertension. The DASH diet is a healthy-eating plan designed to help treat or prevent high blood pressure (hypertension).  The DASH diet includes foods that are rich in potassium, calcium and magnesium. These nutrients help control blood pressure. The diet limits foods that are high in sodium, saturated fat and added sugars.  Studies have shown that the DASH diet can lower blood pressure in as little as two weeks. The diet can also lower low-density lipoprotein (LDL or "bad") cholesterol levels in the blood. High blood pressure and high LDL cholesterol levels are two major risk factors for heart disease and stroke.    DASH diet: Recommended servings The DASH diet provides daily and weekly nutritional goals. The number of servings you should have depends on your daily calorie needs.  Here's a look at the recommended servings from each food group for a 2,000-calorie-a-day DASH diet:  Grains: 6 to 8 servings a day. One serving is one slice bread, 1 ounce dry cereal, or 1/2 cup cooked cereal, rice or pasta. Vegetables: 4 to 5 servings a day. One serving is 1 cup raw leafy green vegetable, 1/2 cup cut-up raw or cooked vegetables, or 1/2 cup vegetable juice. Fruits: 4 to 5 servings a day. One serving is one medium fruit, 1/2 cup fresh, frozen or canned fruit, or 1/2 cup fruit juice. Fat-free or low-fat dairy products: 2 to 3 servings a day. One serving is 1 cup milk or yogurt, or 1 1/2 ounces cheese. Lean meats, poultry and fish: six 1-ounce servings or fewer a day. One serving is 1 ounce cooked meat, poultry or fish, or 1 egg. Nuts, seeds and legumes: 4 to 5 servings a week. One  serving is 1/3 cup nuts, 2 tablespoons peanut butter, 2 tablespoons seeds, or 1/2 cup cooked legumes (dried beans or peas). Fats and oils: 2 to 3 servings a day. One serving is 1 teaspoon soft margarine, 1 teaspoon vegetable oil, 1 tablespoon mayonnaise or 2 tablespoons salad dressing. Sweets and added sugars: 5 servings or fewer a week. One serving is 1 tablespoon sugar, jelly or jam, 1/2 cup sorbet, or 1 cup lemonade.

## 2020-10-09 NOTE — Assessment & Plan Note (Signed)
Not ideally controlled at present   >>>> change to diovan 160 -12.5 one daily   F/u in 4 weeks          Each maintenance medication was reviewed in detail including emphasizing most importantly the difference between maintenance and prns and under what circumstances the prns are to be triggered using an action plan format where appropriate.  Total time for H and P, chart review, counseling, teaching device and generating customized AVS unique to this office visit / charting  > 30 min

## 2020-10-09 NOTE — Assessment & Plan Note (Addendum)
Quit smoking 2017  - Spirometry  01/04/17  FEV1 0.99 (23%)  Ratio 0.28 p ? Prior  = Allergy profile 04/09/20  >  Eos 0. 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 04/09/2020  MM   Level 156  - 04/09/2020 rec pred 20 until better then 10 mg daily x one week wean - 05/09/2020  After extensive coaching inhaler device,  effectiveness =    90% > rec  continue flovent 220/ stiolto and completely  taper off prednisone  - PFT's  06/24/20  FEV1 1.15 (26 % ) ratio 0.27  p 35 % improvement from saba p stiolto prior to study with DLCO  11.45 (35%) corrects to 1.88 (46%)  for alv volume and FV curve classic curvature  - 09/08/2020    try breztri   - 10/09/2020  After extensive coaching inhaler device,  effectiveness =    90% with smi > change back to stiolto and pred ceiling 20/floor 10 and added protonix 40 mg q am ac as pseudowheeze on exam   Group D in terms of symptom/risk and laba/lama/ICS  therefore appropriate rx at this point >>>  Change back to stiolto at pt preference and use pred as ICS for now.  The goal with a chronic steroid dependent illness is always arriving at the lowest effective dose that controls the disease/symptoms and not accepting a set "formula" which is based on statistics or guidelines that don't always take into account patient  variability or the natural hx of the dz in every individual patient, which may well vary over time.  For now therefore I recommend the patient maintain  20 mg ceiling and 10 mg floor  Re saba: I spent extra time with pt today reviewing appropriate use of albuterol for prn use on exertion with the following points: 1) saba is for relief of sob that does not improve by walking a slower pace or resting but rather if the pt does not improve after trying this first. 2) If the pt is convinced, as many are, that saba helps recover from activity faster then it's easy to tell if this is the case by re-challenging : ie stop, take the inhaler, then p 5 minutes try the exact same  activity (intensity of workload) that just caused the symptoms and see if they are substantially diminished or not after saba 3) if there is an activity that reproducibly causes the symptoms, try the saba 15 min before the activity on alternate days   If in fact the saba really does help, then fine to continue to use it prn but advised may need to look closer at the maintenance regimen being used to achieve better control of airways disease with exertion.   >>> cxr today, f/u in 4 weeks with all meds in hand using a trust but verify approach to confirm accurate Medication  Reconciliation The principal here is that until we are certain that the  patients are doing what we've asked, it makes no sense to ask them to do more.

## 2020-10-09 NOTE — Progress Notes (Signed)
Scott Hubbard, male    DOB: 12-31-1955    MRN: [id number]   Brief patient profile:  47 yowm MM/quit smoking 04/2016 @ onset of severe spell of sob and since then doe gradually worse despite rx with flovent/ stiolto so referred to pulmonary clinic 04/09/2020 by Dr   Scott [place]     History of Present Illness  04/09/2020  Pulmonary/ 1st Hubbard eval/Scott Hubbard  Chief Complaint  Patient presents with  . Pulmonary Consult    Referred by Dr. Benny Hubbard for eval of COPD. Former pt of Dr Scott Hubbard. He states he gets SOB "when I do anything".    Dyspnea:  [employer] x 2 aisles but walks "faster than other people" (walked very slowly in [place]) so MMRC3 = can't walk 100 yards even at a slow pace at a flat grade s stopping due to sob    Last time mb and back was prior to prior to when he quit smoking as has long driveway with incline on way back  Cough: none Sleep: flat bed / one pillow L side s respiratory symptoms  SABA use: 2 pffs each am / does not know how when to use hfa vs neb     rec Plan A = Automatic = Always=    Stiolto x 2 pffs/ flovent x 2puffs  first thing in am and repeat flovent in pm  Prednisone 10 mg x 2 daily until breathing better then 1 daily x a week and stop (#60)     Take valsartan 40 mg x 2 pills daily until you see your cardiologist            Plan B = Backup (to supplement plan A, not to replace it) Only use your albuterol inhaler as a rescue medication    05/09/2020  f/u ov/Scott Hubbard re: GOLD IV/ group D symptom/risk still on pred 10 mg daily  Chief Complaint  Patient presents with  . Follow-up    Breathing is some better since the last visit. She is using her albuterol inhaler 1-2 x per day and has not needed neb.   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 Follow-up    Patient feels like his breathing is worse since last visit, shortness of breath with exertion, productive cough with white sputum, spouse states he eats salt like crazy.    Dyspnea:   Walking at [employer] x only  one aisle  = MMRC3 = can't walk 100 yards even at a slow pace at a flat grade s stopping due to sob   Cough: usual worse in am / mucoid  Sleeping: flat bed one pillow prefer L side down  SABA use: rarely  02: none  rec Plan A = Automatic = Always=    Breztri Take 2 puffs first thing in am and then another 2 puffs about 12 hours later. (replacing the flovent and stiolto) Prednisone 10 mg 2 daily until better then 1 daily x one week stop  Plan B = Backup (to supplement plan A, not to replace it) Only use your albuterol inhaler as a rescue medication Plan  C = Crisis (instead of Plan B but only if Plan B stops working) - only use your albuterol nebulizer if you first try Plan B and it fails to help Increase valsartan to 40 mg x 2 = 80 mg tablets on daily and avoid excess salt if possible       10/09/2020  f/u ov/Scott Hubbard/Scott [place] re: copd IV / hbp off acei  Maint rx = breztri, not using saba as rec "they didn't work in pastTour manager Complaint  Patient presents with  . Follow-up    Breathing is worse since starting on Breztri. He is using his albuterol inhaler 6-8 x per day.   Dyspnea:  Room to room  Cough:rattling but not productive worse in am  Sleeping: on side/ one pillow  SABA use: not using approp  02: none    No obvious day to day or daytime variability or assoc excess/ purulent sputum or mucus plugs or hemoptysis or cp or chest tightness, subjective wheeze or overt sinus or hb symptoms.   Sleeping  without nocturnal   exacerbation  of respiratory  c/o's or need for noct saba. Also denies any obvious fluctuation of symptoms with weather or environmental changes or other aggravating or alleviating factors except  as outlined above   No unusual exposure hx or h/o childhood pna/ asthma or knowledge of premature birth.  Current Allergies, Complete Past Medical History, Past Surgical History, Family History, and Social History were reviewed in [employer] record.  ROS  The following are not active complaints unless bolded Hoarseness, sore throat, dysphagia, dental problems, itching, sneezing,  nasal congestion or discharge of excess mucus or purulent secretions, ear ache,   fever, chills, sweats, unintended wt loss or wt gain, classically pleuritic or exertional cp,  orthopnea pnd or arm/hand swelling  or leg swelling, presyncope, palpitations, abdominal pain, anorexia, nausea, vomiting, diarrhea  or change in bowel habits or change in bladder habits, change in stools or change in urine, dysuria, hematuria,  rash, arthralgias, visual complaints, headache, numbness, weakness or ataxia or problems with walking or coordination,  change in mood or  memory.        Current Meds  Medication Sig  . albuterol (VENTOLIN HFA) 108 (90 Base) MCG/ACT inhaler INHALE 2 PUFFS INTO THE LUNGS EVERY 4 (FOUR) HOURS AS NEEDED FOR WHEEZING OR SHORTNESS OF BREATH.  . Budeson-Glycopyrrol-Formoterol (BREZTRI AEROSPHERE) 160-9-4.8 MCG/ACT AERO Inhale 2 puffs into the lungs 2 (two) times daily.  . predniSONE (DELTASONE) 10 MG tablet 1 daily   . valsartan (DIOVAN) 80 MG tablet Take 1 tablet (80 mg total) by mouth daily.                                                     Past Medical History:  Diagnosis Date  . Chest discomfort    negative stress echocardiogram-01/2011  . Chronic bronchitis   . COPD (chronic obstructive pulmonary disease) ([place])   . DJD (degenerative joint disease)   . Dyspnea   . Gastro-esophageal reflux disease with esophagitis   . GERD (gastroesophageal reflux disease)   . Hyperlipidemia   . Hypertension   . Migraine   . PVC (premature ventricular contraction)    Frequent  .  Tobacco abuse   . Tobacco abuse    40 pack years  Objective:           09/08/2020       195   05/09/20 197 lb (89.4 kg)  04/30/20 197 lb (89.4 kg)  04/09/20 197 lb (89.4 kg)     amb mod obese wm nad rattling cough and prominent pseudowheeze better with plm  Vital signs reviewed  10/09/2020  - Note at rest 02 sats  95% on RA  And BP 172/90     Edentulous  HEENT : pt wearing mask not removed for exam due to covid -19 concerns.    NECK :  without JVD/Nodes/TM/ nl carotid upstrokes bilaterally   LUNGS: no acc muscle use,  Mod barrel  contour chest wall with bilateral pan exp sonorous rhonchi  and  without cough on insp or exp maneuvers and mod  Hyperresonant  to  percussion bilaterally     CV:  RRR  no s3 or murmur or increase in P2, and no edema   ABD:  Obese soft (pot belly contour) and nontender with pos mid insp Hoover's  in the supine position. No bruits or organomegaly appreciated, bowel sounds nl  MS:     ext warm without deformities, calf tenderness, cyanosis or clubbing No obvious joint restrictions   SKIN: warm and dry without lesions    NEURO:  alert, approp, nl sensorium with  no motor or cerebellar deficits apparent.                Assessment

## 2020-10-09 NOTE — Patient Instructions (Addendum)
Change valsartan to 160-12.5 one daily   Plan A = Automatic = Always=    Stiolto 2 puffs each am  (the same as taking breztri 2 every 12 hours)   Prednisone 10 mg 2 daily until better then 1 daily until return    Plan B = Backup (to supplement plan A, not to replace it) Only use your albuterol inhaler as a rescue medication to be used if you can't catch your breath by resting or doing a relaxed purse lip breathing pattern.  - The less you use it, the better it will work when you need it. - Ok to use the inhaler up to 2 puffs  every 4 hours if you must but call for appointment if use goes up over your usual need - Don't leave home without it !!  (think of it like the spare tire for your car)   Plan C = Crisis (instead of Plan B but only if Plan B stops working) - only use your albuterol nebulizer if you first try Plan B and it fails to help > ok to use the nebulizer up to every 4 hours but if start needing it regularly call for immediate appointment    Protonix 40 mg Take 30-60 min before first meal of the day    Please remember to go to the  x-ray department  @  [facility] for your tests - we will call you with the results when they are available     Please schedule a follow up office visit in 4 weeks, sooner if needed

## 2020-10-12 DIAGNOSIS — Z7689 Persons encountering health services in other specified circumstances: Secondary | ICD-10-CM | POA: Insufficient documentation

## 2020-10-12 DIAGNOSIS — R7303 Prediabetes: Secondary | ICD-10-CM | POA: Insufficient documentation

## 2020-10-12 DIAGNOSIS — M62838 Other muscle spasm: Secondary | ICD-10-CM | POA: Insufficient documentation

## 2020-10-12 DIAGNOSIS — G2581 Restless legs syndrome: Secondary | ICD-10-CM | POA: Insufficient documentation

## 2020-10-12 NOTE — Assessment & Plan Note (Signed)
Care established Previous chart reviewed History and medications reviewed with the patient 

## 2020-10-12 NOTE — Assessment & Plan Note (Signed)
Worse at nighttime Flexeril PRN

## 2020-10-12 NOTE — Assessment & Plan Note (Signed)
Lab Results  Component Value Date   HGBA1C 5.7 (H) 09/27/2019   Likely related chronic steroid therapy Advised for diet modification and mild exercises/walking as tolerated F/u HbA1C in next visit

## 2020-10-12 NOTE — Assessment & Plan Note (Signed)
BP Readings from Last 1 Encounters:  10/09/20 (!) 162/98   uncontrolled with Valsartan 80 mg QD Switched to Valsartan-HCTZ 160-12.5 mg QD by Dr Melvyn Novas today Counseled for compliance with the medications Advised DASH diet and moderate exercise/walking, at least 150 mins/week

## 2020-10-12 NOTE — Assessment & Plan Note (Signed)
Quit smoking in 2017 Was on Breztri, switched back to [employer] by Dr Melvyn Novas On oral Prednisone 20 mg QD, tapering to 10 mg later Albuterol nebs and inhaler PRN Follows up with Dr Melvyn Novas  Had CXR early this year. No low-dose CT scan of the chest on record. Will order it in the next visit.

## 2020-10-12 NOTE — Progress Notes (Signed)
New Patient Office Visit  Subjective:  Patient ID: REINER LOEWEN, male    DOB: 02-20-1956  Age: 64 y.o. MRN: [id number]  CC:  Chief Complaint  Patient presents with   New Patient (Initial Visit)    establish care    HPI Scott Hubbard is a 64 year old male with past medical history of COPD, uncontrolled hypertension and previous tobacco abuse who presents for establishing care.  Patient went to Dr. Gustavus Bryant office today for follow-up of his COPD.  Patient complains of chronic wheezing and shortness of breath upon exertion.  Patient was on Breztri inhaler, but is switched back to [employer] inhaler today.  Patient is on chronic steroid (oral prednisone) for his COPD, which was increased to 20 mg once daily today by Dr Melvyn Novas.  Patient denies any fever, chills, nausea, vomiting, chest pain, or palpitations.  Patient has a history of hypertension, for which he was taking valsartan 80 mg once daily.  His BP was 162/98 in the office today, which he states is better than the one noticed that Dr. Gustavus Bryant office today.  He was placed on valsartan-hydrochlorothiazide 160-12.5 mg by Dr Melvyn Novas today.  Patient denies any headache, dizziness, chest pain, or palpitations.  Patient also complains of bilateral calf muscle spasms, which is worse at night and sometimes wakes him up at nighttime.  Muscle spasms are unrelated to activity, diet and are not relieved with Tylenol or ibuprofen.  Patient denies any numbness, tingling or weakness in the bilateral LE.  Patient had a negative Cologuard in 08/2019.  Patient has had both doses of Covid vaccination and flu vaccine.  Patient denies pneumonia and Shingrix vaccine today.  Past Medical History:  Diagnosis Date   Chest discomfort    negative stress echocardiogram-01/2011   Chronic bronchitis    COPD (chronic obstructive pulmonary disease) (HCC)    DJD (degenerative joint disease)    Gastro-esophageal reflux disease with esophagitis    GERD  (gastroesophageal reflux disease)    Hyperlipidemia    Hypertension    Migraine    PVC (premature ventricular contraction)    Frequent   Tobacco abuse    40 pack years    Past Surgical History:  Procedure Laterality Date   None      History reviewed. No pertinent family history.  Social History   Socioeconomic History   Marital status: Legally Separated    Spouse name: Not on file   Number of children: 2   Years of education: Not on file   Highest education level: Not on file  Occupational History   Occupation: unemployed    Employer: UNEMPLOYED  Tobacco Use   Smoking status: Former Smoker    Packs/day: 1.50    Years: 40.00    Pack years: 60.00    Quit date: 04/27/2016    Years since quitting: 4.4   Smokeless tobacco: Never Used  Vaping Use   Vaping Use: Never used  Substance and Sexual Activity   Alcohol use: No   Drug use: No   Sexual activity: Not on file  Other Topics Concern   Not on file  Social History Narrative   Lives with girlfriend.  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     Social Determinants of Health   Financial Resource Strain:    Difficulty of Paying Living Expenses: Not on file  Food Insecurity:    Worried About [place] in the Last Year: Not on file   [employer] of Food in  the Last Year: Not on file  Transportation Needs:    Lack of Transportation (Medical): Not on file   Lack of Transportation (Non-Medical): Not on file  Physical Activity:    Days of Exercise per Week: Not on file   Minutes of Exercise per Session: Not on file  Stress:    Feeling of Stress : Not on file  Social Connections:    Frequency of Communication with Friends and Family: Not on file   Frequency of Social Gatherings with Friends and Family: Not on file   Attends Religious Services: Not on file   Active Member of Clubs or Organizations: Not on file   Attends Archivist Meetings: Not on file   Marital Status: Not on  file  Intimate Partner Violence:    Fear of Current or Ex-Partner: Not on file   Emotionally Abused: Not on file   Physically Abused: Not on file   Sexually Abused: Not on file    ROS Review of Systems  Constitutional: Negative for chills and fever.  HENT: Negative for congestion, sinus pressure, sinus pain and sore throat.   Eyes: Negative for pain and discharge.  Respiratory: Positive for shortness of breath and wheezing. Negative for cough.   Cardiovascular: Negative for chest pain and palpitations.  Gastrointestinal: Negative for constipation, diarrhea, nausea and vomiting.  Endocrine: Negative for polydipsia and polyuria.  Genitourinary: Negative for dysuria and hematuria.  Musculoskeletal: Negative for neck pain and neck stiffness.  Skin: Negative for rash.  Neurological: Negative for dizziness, weakness, numbness and headaches.  Psychiatric/Behavioral: Negative for agitation and behavioral problems.    Objective:   Today's Vitals: BP (!) 162/98    Pulse 80    Resp 18    Ht 6\' 5"  (1.956 m)    Wt 204 lb (92.5 kg)    SpO2 95%    BMI 24.19 kg/m   Physical Exam Vitals reviewed.  Constitutional:      General: He is not in acute distress.    Appearance: He is not diaphoretic.  HENT:     Head: Normocephalic and atraumatic.     Nose: Nose normal.     Mouth/Throat:     Mouth: Mucous membranes are moist.  Eyes:     General: No scleral icterus.    Extraocular Movements: Extraocular movements intact.     Pupils: Pupils are equal, round, and reactive to light.  Cardiovascular:     Rate and Rhythm: Normal rate and regular rhythm.     Pulses: Normal pulses.     Heart sounds: No murmur heard.  No friction rub. No gallop.   Pulmonary:     Effort: No respiratory distress.     Breath sounds: Wheezing (Diffuse (b/l)) present. No rales.  Abdominal:     Palpations: Abdomen is soft.     Tenderness: There is no abdominal tenderness.  Musculoskeletal:        General: No  swelling or signs of injury.     Cervical back: Neck supple. No rigidity or tenderness.     Right lower leg: No edema.     Left lower leg: No edema.  Skin:    General: Skin is warm.     Findings: No rash.  Neurological:     General: No focal deficit present.     Mental Status: He is alert and oriented to person, place, and time.     Sensory: No sensory deficit.     Motor: No weakness.  Psychiatric:  Mood and Affect: Mood normal.        Behavior: Behavior normal.     Assessment & Plan:   Problem List Items Addressed This Visit      Encounter to establish care - Primary   Care established Previous chart reviewed History and medications reviewed with the patient      Cardiovascular and Mediastinum   Essential hypertension    BP Readings from Last 1 Encounters:  10/09/20 (!) 162/98   uncontrolled with Valsartan 80 mg QD Switched to Valsartan-HCTZ 160-12.5 mg QD by Dr Melvyn Novas today Counseled for compliance with the medications Advised DASH diet and moderate exercise/walking, at least 150 mins/week         Respiratory   COPD GOLD IV/ Group D     Quit smoking in 2017 Was on Breztri, switched back to [employer] by Dr Melvyn Novas On oral Prednisone 20 mg QD, tapering to 10 mg later Albuterol nebs and inhaler PRN Follows up with Dr Melvyn Novas  Had CXR early this year. No low-dose CT scan of the chest on record. Will order it in the next visit.          Digestive   GERD (gastroesophageal reflux disease)    On Pantoprazole 40 mg QD Partly contributed by chronic steroid therapy        Other         Muscle spasms of both lower extremities    Worse at nighttime Flexeril PRN      Relevant Medications   cyclobenzaprine (FLEXERIL) 10 MG tablet   Prediabetes    Lab Results  Component Value Date   HGBA1C 5.7 (H) 09/27/2019   Likely related chronic steroid therapy Advised for diet modification and mild exercises/walking as tolerated F/u HbA1C in next visit           Outpatient Encounter Medications as of 10/09/2020  Medication Sig   albuterol (PROVENTIL) (2.5 MG/3ML) 0.083% nebulizer solution Take 3 mLs (2.5 mg total) by nebulization every 4 (four) hours as needed for wheezing or shortness of breath.   albuterol (VENTOLIN HFA) 108 (90 Base) MCG/ACT inhaler INHALE 2 PUFFS INTO THE LUNGS EVERY 4 (FOUR) HOURS AS NEEDED FOR WHEEZING OR SHORTNESS OF BREATH.   pantoprazole (PROTONIX) 40 MG tablet Take 1 tablet (40 mg total) by mouth daily. Take 30-60 min before first meal of the day   predniSONE (DELTASONE) 10 MG tablet Take as directed   Tiotropium Bromide-Olodaterol (STIOLTO RESPIMAT) 2.5-2.5 MCG/ACT AERS Inhale 2 puffs into the lungs daily.   valsartan-hydrochlorothiazide (DIOVAN HCT) 160-12.5 MG tablet Take 1 tablet by mouth daily.   cyclobenzaprine (FLEXERIL) 10 MG tablet Take 1 tablet (10 mg total) by mouth 2 (two) times daily as needed for muscle spasms.   [DISCONTINUED] Budeson-Glycopyrrol-Formoterol (BREZTRI AEROSPHERE) 160-9-4.8 MCG/ACT AERO Inhale 2 puffs into the lungs 2 (two) times daily.   [DISCONTINUED] Tiotropium Bromide-Olodaterol (STIOLTO RESPIMAT) 2.5-2.5 MCG/ACT AERS Inhale 2 puffs into the lungs daily. (Patient not taking: Reported on 10/09/2020)   [DISCONTINUED] valsartan (DIOVAN) 80 MG tablet Take 1 tablet (80 mg total) by mouth daily.   No facility-administered encounter medications on file as of 10/09/2020.    Follow-up: Return in about 3 months (around 01/09/2021).   Lindell Spar, MD

## 2020-10-12 NOTE — Assessment & Plan Note (Signed)
On Pantoprazole 40 mg QD °Partly contributed by chronic steroid therapy °

## 2020-10-14 ENCOUNTER — Other Ambulatory Visit: Payer: Self-pay

## 2020-10-14 ENCOUNTER — Ambulatory Visit (HOSPITAL_COMMUNITY)
Admission: RE | Admit: 2020-10-14 | Discharge: 2020-10-14 | Disposition: A | Discharge status: Home / Self Care | Payer: Medicare Other | Source: Ambulatory Visit | Attending: Internal Medicine | Admitting: Internal Medicine

## 2020-10-14 ENCOUNTER — Other Ambulatory Visit: Payer: Self-pay | Admitting: Internal Medicine

## 2020-10-14 DIAGNOSIS — J449 Chronic obstructive pulmonary disease, unspecified: Secondary | ICD-10-CM | POA: Diagnosis present

## 2020-10-17 ENCOUNTER — Encounter: Payer: Self-pay | Admitting: *Deleted

## 2020-11-06 ENCOUNTER — Encounter: Payer: Self-pay | Admitting: Internal Medicine

## 2020-11-06 ENCOUNTER — Other Ambulatory Visit: Payer: Self-pay

## 2020-11-06 ENCOUNTER — Ambulatory Visit (INDEPENDENT_AMBULATORY_CARE_PROVIDER_SITE_OTHER): Payer: Medicare Other | Admitting: Internal Medicine

## 2020-11-06 DIAGNOSIS — I10 Essential (primary) hypertension: Secondary | ICD-10-CM | POA: Diagnosis not present

## 2020-11-06 DIAGNOSIS — J449 Chronic obstructive pulmonary disease, unspecified: Secondary | ICD-10-CM | POA: Diagnosis not present

## 2020-11-06 MED ORDER — FAMOTIDINE 20 MG PO TABS: ORAL_TABLET | ORAL | 11 refills | Status: DC

## 2020-11-06 MED ORDER — VALSARTAN-HYDROCHLOROTHIAZIDE 80-12.5 MG PO TABS: 1.0000 | ORAL_TABLET | Freq: Every day | ORAL | 11 refills | Status: DC

## 2020-11-06 MED ORDER — PREDNISONE 10 MG PO TABS: ORAL_TABLET | ORAL | 0 refills | Status: DC

## 2020-11-06 NOTE — Assessment & Plan Note (Addendum)
Quit smoking 2017  - Spirometry  01/04/17  FEV1 0.99 (23%)  Ratio 0.28 p ? Prior  = Allergy profile 04/09/20  >  Eos 0. 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 04/09/2020  MM   Level 156  - 04/09/2020 rec pred 20 until better then 10 mg daily x one week wean - 05/09/2020  After extensive coaching inhaler device,  effectiveness =    90% > rec  continue flovent 220/ stiolto and completely  taper off prednisone  - PFT's  06/24/20  FEV1 1.15 (26 % ) ratio 0.27  p 35 % improvement from saba p stiolto prior to study with DLCO  11.45 (35%) corrects to 1.88 (46%)  for alv volume and FV curve classic curvature  - 09/08/2020  After extensive coaching inhaler device,  effectiveness =    90% so try breztri  - 10/09/2020  After extensive coaching inhaler device,  effectiveness =    90% with smi > change back to stiolto and pred ceiling 20/floor 10 and added protonix 40 mg q am ac as pseudowheeze on exam  - 11/06/2020 added pm doses of pepcid/ gerd recs including bed blocks   - .11/06/2020 pred floor of 10 mg / ceiling of 20 mg -  11/06/2020   Walked RA  approx   250 ft  @ avg pace  stopped due to  Sob/ fatigue with sats still 96%  Typical of a pink puffer    Group D in terms of symptom/risk and laba/lama/ICS  therefore appropriate rx at this point >>>  stiolto 2 each am plus prednisone as above  The goal with a chronic steroid dependent illness is always arriving at the lowest effective dose that controls the disease/symptoms and not accepting a set "formula" which is based on statistics or guidelines that don't always take into account patient  variability or the natural hx of the dz in every individual patient, which may well vary over time.  For now therefore I recommend the patient maintain  20 mg ceiling / 10 mg day floor     Re saba: I spent extra time with pt today reviewing appropriate use of albuterol for prn use on exertion with the following points: 1) saba is for relief of sob that does not improve by  walking a slower pace or resting but rather if the pt does not improve after trying this first. 2) If the pt is convinced, as many are, that saba helps recover from activity faster then it's easy to tell if this is the case by re-challenging : ie stop, take the inhaler, then p 5 minutes try the exact same activity (intensity of workload) that just caused the symptoms and see if they are substantially diminished or not after saba 3) if there is an activity that reproducibly causes the symptoms, try the saba 15 min before the activity on alternate days   If in fact the saba really does help, then fine to continue to use it prn but advised may need to look closer at the maintenance regimen being used to achieve better control of airways disease with exertion.    Strongly rec pulmonary rehab

## 2020-11-06 NOTE — Assessment & Plan Note (Signed)
Over treated at present so reduce valsartan to 80/12.5 mg one daily   F/u with PCP           Each maintenance medication was reviewed in detail including emphasizing most importantly the difference between maintenance and prns and under what circumstances the prns are to be triggered using an action plan format where appropriate.  Total time for H and P, chart review, counseling, directly observing portions of ambulatory 02 saturation study/  and generating customized AVS unique to this office visit / charting = *30 min

## 2020-11-06 NOTE — Patient Instructions (Addendum)
Pepcid 20 mg (famotidine)  One after supper  GERD (REFLUX)  is an extremely common cause of respiratory symptoms just like yours , many times with no obvious heartburn at all.    It can be treated with medication, but also with lifestyle changes including elevation of the head of your bed (ideally with 6 -8inch blocks under the headboard of your bed),  Smoking cessation, avoidance of late meals, excessive alcohol, and avoid fatty foods, chocolate, peppermint, colas(like pepsi)  , red wine, and acidic juices such as orange juice.  NO MINT OR MENTHOL PRODUCTS SO NO COUGH DROPS  USE SUGARLESS CANDY INSTEAD (Jolley ranchers or Stover's or Life Savers) or even ice chips will also do - the key is to swallow to prevent all throat clearing. NO OIL BASED VITAMINS - use powdered substitutes.  Avoid fish oil when coughing.    Reduce the valsartan to 80-12.5 mg one daily when you refill it .   Prednisone 10 mg one each am  until return  - if worse take two each am    Please schedule a follow up office visit in 6 weeks, call sooner if needed with all medications /inhalers/ solutions in hand so we can verify exactly what you are taking. This includes all medications from all doctors and over the counters  >>> readdress pulmonary rehab on return

## 2020-11-06 NOTE — Progress Notes (Signed)
MAVERIC Hubbard, male    DOB: 05/10/1956    MRN: [id number]   Brief patient profile:  47 yowm MM/quit smoking 04/2016 @ onset of severe spell of sob and since then doe gradually worse despite rx with flovent/ stiolto so referred to pulmonary clinic 04/09/2020 by Dr  Scott Hubbard     History of Present Illness  04/09/2020  Pulmonary/ 1st office eval/Scott Hubbard  Chief Complaint  Patient presents with  . Pulmonary Consult    Referred by Dr. Benny Hubbard for eval of COPD. Former pt of Dr Scott Hubbard. He states he gets SOB "when I do anything".    Dyspnea:  [employer] x 2 aisles but walks "faster than other people" (walked very slowly in office) so MMRC3 = can't walk 100 yards even at a slow pace at a flat grade s stopping due to sob    Last time mb and back was prior to prior to when he quit smoking as has long driveway with incline on way back  Cough: none Sleep: flat bed / one pillow L side s respiratory symptoms  SABA use: 2 pffs each am / does not know how when to use hfa vs neb     rec Plan A = Automatic = Always=    Stiolto x 2 pffs/ flovent x 2puffs  first thing in am and repeat flovent in pm  Prednisone 10 mg x 2 daily until breathing better then 1 daily x a week and stop (#60)     Take valsartan 40 mg x 2 pills daily until you see your cardiologist            Plan B = Backup (to supplement plan A, not to replace it) Only use your albuterol inhaler as a rescue medication    05/09/2020  f/u ov/Scott Hubbard re: GOLD IV/ group D symptom/risk still on pred 10 mg daily  Chief Complaint  Patient presents with  . Follow-up    Breathing is some better since the last visit. She is using her albuterol inhaler 1-2 x per day and has not needed neb.   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 Follow-up    Patient feels like his breathing is worse since last visit, shortness of breath with exertion, productive cough with white sputum, spouse states he eats salt like crazy.    Dyspnea:   Walking at [employer] x only  one aisle  = MMRC3 = can't walk 100 yards even at a slow pace at a flat grade s stopping due to sob   Cough: usual worse in am / mucoid  Sleeping: flat bed one pillow prefer L side down  SABA use: rarely  02: none  rec Plan A = Automatic = Always=    Breztri Take 2 puffs first thing in am and then another 2 puffs about 12 hours later. (replacing the flovent and stiolto) Prednisone 10 mg 2 daily until better then 1 daily x one week stop  Plan B = Backup (to supplement plan A, not to replace it) Only use your albuterol inhaler as a rescue medication Plan C =  Crisis (instead of Plan B but only if Plan B stops working) - only use your albuterol nebulizer if you first try Plan B and it fails to help Increase valsartan to 40 mg x 2 = 80 mg tablets on daily and avoid excess salt if possible       10/09/2020  f/u ov/[place] office/Scott Hubbard re: copd IV / hbp off acei  Maint rx = breztri, not using saba as rec "they didn't work in pastTour manager Complaint  Patient presents with  . Follow-up    Breathing is worse since starting on Breztri. He is using his albuterol inhaler 6-8 x per day.   Dyspnea:  Room to room  Cough:rattling but not productive worse in am  Sleeping: on side/ one pillow  SABA use: not using approp  02: none  rec Change valsartan to 160-12.5 one daily  Plan A = Automatic = Always=    Stiolto 2 puffs each am  (the same as taking breztri 2 every 12 hours)  Prednisone 10 mg 2 daily until better then 1 daily until return   Plan B = Backup (to supplement plan A, not to replace it) Only use your albuterol inhaler as a rescue medication  Plan C = Crisis (instead of Plan B but only if Plan B stops working) - only use  your albuterol nebulizer if you first try Plan B   Please remember to go to the  x-ray department  > done 10/14/20 Underlying emphysematous change, stable, with mild scarring on the left. No edema or airspace opacity. Stable cardiac silhouette. Emphysema (ICD10-J43.9).       11/06/2020  f/u ov/[place] office/Scott Hubbard re:  Copd GOLD IV / stiolto / pred 20 mg daily seemed to reduce saba dep Chief Complaint  Patient presents with  . Follow-up    productive cough with white phlegm  Dyspnea:  Room  To room  Cough: nothing unusual  For him, mostly mucoid worse in am   Sleeping: bed is flat one pillow L side down  SABA use: maybe less  02: none    No obvious day to day or daytime variability or assoc excess/ purulent sputum or mucus plugs or hemoptysis or cp or chest tightness, subjective wheeze or overt sinus or hb symptoms.   Sleeping as above without nocturnal  or early am exacerbation  of respiratory  c/o's or need for noct saba. Also denies any obvious fluctuation of symptoms with weather or environmental changes or other aggravating or alleviating factors except as outlined above   No unusual exposure hx or h/o childhood pna/ asthma or knowledge of premature birth.  Current Allergies, Complete Past Medical History, Past Surgical History, Family History, and Social History were reviewed in [facility] record.  ROS  The following are not active complaints unless bolded Hoarseness, sore throat, dysphagia, dental problems, itching, sneezing,  nasal congestion or discharge of excess mucus or purulent secretions, ear ache,   fever, chills, sweats, unintended wt loss or wt gain, classically pleuritic or exertional cp,  orthopnea pnd or arm/hand swelling  or leg swelling, presyncope, palpitations, abdominal pain, anorexia, nausea, vomiting, diarrhea  or change in bowel habits or change in bladder habits, change in stools or change in urine, dysuria, hematuria,  rash,  arthralgias, visual complaints, headache, numbness, weakness or ataxia or problems with walking or coordination,  change in mood or  memory.        Current Meds  Medication Sig  . albuterol (PROVENTIL) (  2.5 MG/3ML) 0.083% nebulizer solution Take 3 mLs (2.5 mg total) by nebulization every 4 (four) hours as needed for wheezing or shortness of breath.  [employer] albuterol (VENTOLIN HFA) 108 (90 Base) MCG/ACT inhaler INHALE 2 PUFFS INTO THE LUNGS EVERY 4 (FOUR) HOURS AS NEEDED FOR WHEEZING OR SHORTNESS OF BREATH.  . cyclobenzaprine (FLEXERIL) 10 MG tablet Take 1 tablet (10 mg total) by mouth 2 (two) times daily as needed for muscle spasms.  . pantoprazole (PROTONIX) 40 MG tablet Take 1 tablet (40 mg total) by mouth daily. Take 30-60 min before first meal of the day  . Tiotropium Bromide-Olodaterol (STIOLTO RESPIMAT) 2.5-2.5 MCG/ACT AERS Inhale 2 puffs into the lungs daily.  . valsartan-hydrochlorothiazide (DIOVAN HCT) 160-12.5 MG tablet Take 1 tablet by mouth daily.                                                              Past Medical History:  Diagnosis Date  . Chest discomfort    negative stress echocardiogram-01/2011  . Chronic bronchitis   . COPD (chronic obstructive pulmonary disease) ([place])   . DJD (degenerative joint disease)   . Dyspnea   . Gastro-esophageal reflux disease with esophagitis   . GERD (gastroesophageal reflux disease)   . Hyperlipidemia   . Hypertension   . Migraine   . PVC (premature ventricular contraction)    Frequent  . Tobacco abuse   . Tobacco abuse    40 pack years       Objective:     11/06/2020     200  09/08/2020       195   05/09/20 197 lb (89.4 kg)  04/30/20 197 lb (89.4 kg)  04/09/20 197 lb (89.4 kg)     Vital signs reviewed  11/06/2020  - Note at rest 02 sats  99% on RA       Edentulous  HEENT : pt wearing mask not removed for exam due to covid -19 concerns.    NECK :  without JVD/Nodes/TM/ nl carotid upstrokes  bilaterally   LUNGS: no acc muscle use,  Mod barrel  contour chest wall with bilateral  Distant bs s audible wheeze and  without cough on insp or exp maneuvers and mod  Hyperresonant  to  percussion bilaterally     CV:  RRR  no s3 or murmur or increase in P2, and no edema   ABD:  soft and nontender with pos mid insp Hoover's  in the supine position. No bruits or organomegaly appreciated, bowel sounds nl  MS:     ext warm without deformities, calf tenderness, cyanosis or clubbing No obvious joint restrictions   SKIN: warm and dry without lesions    NEURO:  alert, approp, nl sensorium with  no motor or cerebellar deficits apparent.               Assessment

## 2020-11-09 ENCOUNTER — Other Ambulatory Visit: Payer: Self-pay | Admitting: Internal Medicine

## 2020-11-10 NOTE — Telephone Encounter (Addendum)
Pt is requesting refill on PREDNISONE 10 MG TABLET next ov 12/15/20. Pt is aware meds at pharmacy nothing further needed.

## 2020-11-24 ENCOUNTER — Encounter: Payer: Self-pay | Admitting: Internal Medicine

## 2020-11-24 ENCOUNTER — Other Ambulatory Visit: Payer: Self-pay

## 2020-11-24 ENCOUNTER — Ambulatory Visit (INDEPENDENT_AMBULATORY_CARE_PROVIDER_SITE_OTHER): Payer: Medicare Other | Admitting: Internal Medicine

## 2020-11-24 VITALS — BP 129/81 | HR 100 | Temp 98.0°F | Resp 18 | Ht 77.0 in | Wt 198.0 lb

## 2020-11-24 DIAGNOSIS — K429 Umbilical hernia without obstruction or gangrene: Secondary | ICD-10-CM

## 2020-11-24 DIAGNOSIS — D1721 Benign lipomatous neoplasm of skin and subcutaneous tissue of right arm: Secondary | ICD-10-CM | POA: Insufficient documentation

## 2020-11-24 DIAGNOSIS — K5900 Constipation, unspecified: Secondary | ICD-10-CM | POA: Insufficient documentation

## 2020-11-24 NOTE — Assessment & Plan Note (Signed)
Advised to increase water intake instead of soft drinks Advised to increase intake of green vegetables and fruits in the diet.

## 2020-11-24 NOTE — Patient Instructions (Signed)
Please increase water intake up to at least 1.5-2 liters in a day. Please cut down the use of soft drinks.  Please take green vegetables and fruits to improve digestion and help with constipation.  Okay to take Colace/Docusate as needed for constipation, up to twice in a day.  You are being referred to General surgeon for evaluation of Umbilical hernia.

## 2020-11-24 NOTE — Assessment & Plan Note (Signed)
Likely benign No signs of strangulation Unlikely source of abdominal pain Referred to General surgeon for evaluation as per patient's request.

## 2020-11-24 NOTE — Progress Notes (Signed)
Established Patient Office Visit  Subjective:  Patient ID: Scott Hubbard, male    DOB: 24-Sep-1956  Age: 64 y.o. MRN: [id number]  CC:  Chief Complaint  Patient presents with  . Acute Visit    pt thinks he has a hernia right at umbilical region stomach has been bothering him for a couple of months more a nusance than hurt     HPI Scott Hubbard is a 64 year old male with past medical history of COPD, uncontrolled hypertension and previous tobacco abuse who presents for evaluation of abdominal pain and a mass in the umbilical area.  Patient has noticed a mass in the umbilical area, which appears intermittently for the last 2 years. He denies any relationship with the food intake. It comes out with coughing.  He complains of generalized abdominal pain, which is dull, nonradiating and is not associated with nausea or vomiting.  His wife mentions history of chronic constipation, but patient does not want to start any medication for it as he is worried about loose stools or diarrhea which he cannot control.  He denies any fever, chills, melena or hematochezia.  Of note, he states that he does not drink water at all and takes soft drinks-Pepsi instead of water throughout the day, more in the evening.  He also mentions growing lipoma in the right axilla, which is not painful.  Patient is referred to general surgery for evaluation of umbilical hernia and right axillary lipoma.  Past Medical History:  Diagnosis Date  . Chest discomfort    negative stress echocardiogram-01/2011  . Chronic bronchitis   . COPD (chronic obstructive pulmonary disease) ([place])   . DJD (degenerative joint disease)   . Gastro-esophageal reflux disease with esophagitis   . GERD (gastroesophageal reflux disease)   . Hyperlipidemia   . Hypertension   . Migraine   . PVC (premature ventricular contraction)    Frequent  . Tobacco abuse    40 pack years    Past Surgical History:  Procedure Laterality Date  . None       History reviewed. No pertinent family history.  Social History   Socioeconomic History  . Marital status: Legally Separated    Spouse name: Not on file  . Number of children: 2  . Years of education: Not on file  . Highest education level: Not on file  Occupational History  . Occupation: unemployed    Fish farm manager: UNEMPLOYED  Tobacco Use  . Smoking status: Former Smoker    Packs/day: 1.50    Years: 40.00    Pack years: 60.00    Quit date: 04/27/2016    Years since quitting: 4.5  . Smokeless tobacco: Never Used  Vaping Use  . Vaping Use: Never used  Substance and Sexual Activity  . Alcohol use: No  . Drug use: No  . Sexual activity: Not on file  Other Topics Concern  . Not on file  Social History Narrative   Lives with girlfriend.  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     Social Determinants of Health   Financial Resource Strain:   . Difficulty of Paying Living Expenses: Not on file  Food Insecurity:   . Worried About Charity fundraiser in the Last Year: Not on file  . Ran Out of Food in the Last Year: Not on file  Transportation Needs:   . Lack of Transportation (Medical): Not on file  . Lack of Transportation (Non-Medical): Not on file  Physical Activity:   .  Days of Exercise per Week: Not on file  . Minutes of Exercise per Session: Not on file  Stress:   . Feeling of Stress : Not on file  Social Connections:   . Frequency of Communication with Friends and Family: Not on file  . Frequency of Social Gatherings with Friends and Family: Not on file  . Attends Religious Services: Not on file  . Active Member of Clubs or Organizations: Not on file  . Attends Archivist Meetings: Not on file  . Marital Status: Not on file  Intimate Partner Violence:   . Fear of Current or Ex-Partner: Not on file  . Emotionally Abused: Not on file  . Physically Abused: Not on file  . Sexually Abused: Not on file    Outpatient Medications Prior to Visit  Medication Sig  Dispense Refill  . albuterol (PROVENTIL) (2.5 MG/3ML) 0.083% nebulizer solution Take 3 mLs (2.5 mg total) by nebulization every 4 (four) hours as needed for wheezing or shortness of breath. 75 mL 12  . albuterol (VENTOLIN HFA) 108 (90 Base) MCG/ACT inhaler INHALE 2 PUFFS INTO THE LUNGS EVERY 4 (FOUR) HOURS AS NEEDED FOR WHEEZING OR SHORTNESS OF BREATH. 8.5 g 2  . cyclobenzaprine (FLEXERIL) 10 MG tablet Take 1 tablet (10 mg total) by mouth 2 (two) times daily as needed for muscle spasms. 60 tablet 2  . famotidine (PEPCID) 20 MG tablet One after supper 30 tablet 11  . pantoprazole (PROTONIX) 40 MG tablet Take 1 tablet (40 mg total) by mouth daily. Take 30-60 min before first meal of the day 30 tablet 2  . predniSONE (DELTASONE) 10 MG tablet TAKE AS DIRECTED 100 tablet 0  . Tiotropium Bromide-Olodaterol (STIOLTO RESPIMAT) 2.5-2.5 MCG/ACT AERS Inhale 2 puffs into the lungs daily. 1 each 11  . valsartan-hydrochlorothiazide (DIOVAN HCT) 80-12.5 MG tablet Take 1 tablet by mouth daily. 30 tablet 11   No facility-administered medications prior to visit.    No Known Allergies  ROS Review of Systems  Constitutional: Negative for chills and fever.  HENT: Negative for congestion, sinus pressure, sinus pain and sore throat.   Eyes: Negative for pain and discharge.  Respiratory: Negative for cough, shortness of breath and wheezing.   Cardiovascular: Negative for chest pain and palpitations.  Gastrointestinal: Positive for abdominal distention and constipation. Negative for blood in stool, diarrhea, nausea and vomiting.  Endocrine: Negative for polydipsia and polyuria.  Genitourinary: Negative for dysuria and hematuria.  Musculoskeletal: Negative for neck pain and neck stiffness.  Skin: Negative for rash.  Neurological: Negative for dizziness, weakness, numbness and headaches.  Psychiatric/Behavioral: Negative for agitation and behavioral problems.      Objective:    Physical Exam Vitals reviewed.   Constitutional:      General: He is not in acute distress.    Appearance: He is not diaphoretic.  HENT:     Head: Normocephalic and atraumatic.     Nose: Nose normal.     Mouth/Throat:     Mouth: Mucous membranes are moist.  Eyes:     General: No scleral icterus.    Extraocular Movements: Extraocular movements intact.     Pupils: Pupils are equal, round, and reactive to light.  Cardiovascular:     Rate and Rhythm: Normal rate and regular rhythm.     Pulses: Normal pulses.     Heart sounds: No murmur heard.  No friction rub. No gallop.   Pulmonary:     Effort: Pulmonary effort is normal. No respiratory  distress.     Breath sounds: Normal breath sounds. No rales. Wheezes: Diffuse (b/l)  Abdominal:     General: Bowel sounds are normal.     Palpations: Abdomen is soft.     Tenderness: There is no abdominal tenderness. There is no right CVA tenderness, left CVA tenderness, guarding or rebound.     Hernia: A hernia (Umbilical) is present.  Musculoskeletal:        General: No swelling or signs of injury.     Cervical back: Neck supple. No rigidity or tenderness.     Right lower leg: No edema.     Left lower leg: No edema.  Skin:    General: Skin is warm.     Findings: No rash.  Neurological:     General: No focal deficit present.     Mental Status: He is alert and oriented to person, place, and time.     Sensory: No sensory deficit.     Motor: No weakness.  Psychiatric:        Mood and Affect: Mood normal.        Behavior: Behavior normal.     BP 129/81 (BP Location: Right Arm, Patient Position: Sitting, Cuff Size: Normal)   Pulse 100   Temp 98 F (36.7 C) (Oral)   Resp 18   Ht 6\' 5"  (1.956 m)   Wt 198 lb (89.8 kg)   SpO2 95%   BMI 23.48 kg/m  Wt Readings from Last 3 Encounters:  11/24/20 198 lb (89.8 kg)  11/06/20 200 lb (90.7 kg)  10/09/20 204 lb (92.5 kg)     Health Maintenance Due  Topic Date Due  . Hepatitis C Screening  Never done  . PNA vac Low Risk  Adult (1 of 2 - PCV13) Never done    There are no preventive care reminders to display for this patient.  Lab Results  Component Value Date   TSH 0.602 04/09/2020   Lab Results  Component Value Date   WBC 7.0 04/09/2020   HGB 14.3 04/09/2020   HCT 44.3 04/09/2020   MCV 90.2 04/09/2020   PLT 230 04/09/2020   Lab Results  Component Value Date   NA 135 04/09/2020   K 4.4 04/09/2020   CO2 29 04/09/2020   GLUCOSE 108 (H) 04/09/2020   BUN 19 04/09/2020   CREATININE 1.22 04/09/2020   BILITOT 0.6 09/27/2019   ALKPHOS 76 01/12/2011   AST 14 09/27/2019   ALT 10 09/27/2019   PROT 6.9 09/27/2019   ALBUMIN 4.3 01/12/2011   CALCIUM 8.8 (L) 04/09/2020   ANIONGAP 8 04/09/2020   Lab Results  Component Value Date   CHOL 207 01/12/2011   Lab Results  Component Value Date   HDL 38 01/12/2011   Lab Results  Component Value Date   LDLCALC 21 01/12/2011   Lab Results  Component Value Date   TRIG 106 01/12/2011   No results found for: CHOLHDL Lab Results  Component Value Date   HGBA1C 5.7 (H) 09/27/2019      Assessment & Plan:   Problem List Items Addressed This Visit      Other   Umbilical hernia without obstruction and without gangrene - Primary    Likely benign No signs of strangulation Unlikely source of abdominal pain Referred to General surgeon for evaluation as per patient's request.      Relevant Orders   Ambulatory referral to General Surgery   Lipoma of right axilla    Growing in  size Not painful General surgery referral provided      Relevant Orders   Ambulatory referral to General Surgery   Constipation    Advised to increase water intake instead of soft drinks Advised to increase intake of green vegetables and fruits in the diet.         No orders of the defined types were placed in this encounter.   Follow-up: No follow-ups on file.    Lindell Spar, MD

## 2020-11-24 NOTE — Assessment & Plan Note (Signed)
Growing in size Not painful General surgery referral provided

## 2020-12-10 ENCOUNTER — Other Ambulatory Visit: Payer: Self-pay | Admitting: Family Medicine

## 2020-12-10 DIAGNOSIS — K429 Umbilical hernia without obstruction or gangrene: Secondary | ICD-10-CM

## 2020-12-15 ENCOUNTER — Ambulatory Visit (INDEPENDENT_AMBULATORY_CARE_PROVIDER_SITE_OTHER): Payer: Medicare Other | Admitting: Internal Medicine

## 2020-12-15 ENCOUNTER — Other Ambulatory Visit: Payer: Self-pay

## 2020-12-15 ENCOUNTER — Encounter: Payer: Self-pay | Admitting: Internal Medicine

## 2020-12-15 DIAGNOSIS — J449 Chronic obstructive pulmonary disease, unspecified: Secondary | ICD-10-CM | POA: Diagnosis not present

## 2020-12-15 DIAGNOSIS — R0600 Dyspnea, unspecified: Secondary | ICD-10-CM

## 2020-12-15 DIAGNOSIS — R0609 Other forms of dyspnea: Secondary | ICD-10-CM

## 2020-12-15 NOTE — Assessment & Plan Note (Signed)
04/09/2020   Walked RA x two laps =  approx 254ft @ slow pace - stopped due to sob with sats of 97  % at the end of the study  -  12/15/2020   [facility] RA  approx   450 ft  @ avg pace  stopped due to  Tired, sob with sats 95%   -  Referred to pulmonary rehab at [facility] 12/15/2020    Typical pink puffer and over user of saba > rehab should really help    F/u with all meds in hand using a trust but verify approach to confirm accurate Medication  Reconciliation The principal here is that until we are certain that the  patients are doing what we've asked, it makes no sense to ask them to do more.           Each maintenance medication was reviewed in detail including emphasizing most importantly the difference between maintenance and prns and under what circumstances the prns are to be triggered using an action plan format where appropriate.  Total time for H and P, chart review, counseling,    directly observing portions of ambulatory 02 saturation study/  and generating customized AVS unique to this office visit / charting  > 30 min

## 2020-12-15 NOTE — Addendum Note (Signed)
Addended by: Merrilee Seashore on: 12/15/2020 12:33 PM   Modules accepted: Orders

## 2020-12-15 NOTE — Patient Instructions (Addendum)
Plan A = Automatic = Always=   Stiolto 2 puffs each am   Prednisone 10 mg x 2 daily until better then 1 daily  - always take with breakfast   Plan B = Backup (to supplement plan A, not to replace it) Only use your albuterol inhaler as a rescue medication to be used if you can't catch your breath by resting or doing a relaxed purse lip breathing pattern.  - The less you use it, the better it will work when you need it. - Ok to use the inhaler up to 2 puffs  every 4 hours if you must but call for appointment if use goes up over your usual need - Don't leave home without it !!  (think of it like the spare tire for your car)   Plan C = Crisis (instead of Plan B but only if Plan B stops working) - only use your albuterol nebulizer if you first try Plan B and it fails to help > ok to use the nebulizer up to every 4 hours but if start needing it regularly call for immediate appointment  Ok to Try albuterol (first the [facility] one day and the nebulizer the next) 15 min before an activity that you know would make you short of breath and see if it makes any difference and if makes none then don't take it after activity unless you can't catch your breath.      We will be referring you to pulmonary rehab.   Please schedule a follow up office visit in 6 weeks, call sooner if needed

## 2020-12-15 NOTE — Progress Notes (Signed)
Scott Hubbard    DOB: 02/02/1956    MRN: [id number]   Brief patient profile:  50 yowm MM/quit smoking 04/2016 @ onset of severe spell of sob and since then doe gradually worse despite rx with flovent/ stiolto so referred to pulmonary clinic 04/09/2020 by Dr  Scott Hubbard     History of Present Illness  04/09/2020  Pulmonary/ 1st office eval/Scott Hubbard  Chief Complaint  Patient presents with  . Pulmonary Consult    Referred by Dr. Benny Hubbard for eval of COPD. Former pt of Dr [name]. He states he gets SOB "when I do anything".    Dyspnea:  [employer] x 2 aisles but walks "faster than other people" (walked very slowly in office) so MMRC3 = can't walk 100 yards even at a slow pace at a flat grade s stopping due to sob    Last time mb and back was prior to prior to when he quit smoking as has long driveway with incline on way back  Cough: none Sleep: flat bed / one pillow L side s respiratory symptoms  SABA use: 2 pffs each am / does not know how when to use hfa vs neb     rec Plan A = Automatic = Always=    Stiolto x 2 pffs/ flovent x 2puffs  first thing in am and repeat flovent in pm  Prednisone 10 mg x 2 daily until breathing better then 1 daily x a week and stop (#60)     Take valsartan 40 mg x 2 pills daily until you see your cardiologist            Plan B = Backup (to supplement plan A, not to replace it) Only use your albuterol inhaler as a rescue medication    05/09/2020  f/u ov/Scott Hubbard re: GOLD IV/ group D symptom/risk still on pred 10 mg daily  Chief Complaint  Patient presents with  . Follow-up    Breathing is some better since the last visit. She is using her albuterol inhaler 1-2 x per day and has not needed neb.   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 Follow-up    Patient feels like his breathing is worse since last visit, shortness of breath with exertion, productive cough with white sputum, spouse states he eats salt like crazy.    Dyspnea:   Walking at [employer] x only  one aisle  = MMRC3 = can't walk 100 yards even at a slow pace at a flat grade s stopping due to sob   Cough: usual worse in am / mucoid  Sleeping: flat bed one pillow prefer L side down  SABA use: rarely  02: none  rec Plan A = Automatic = Always=    Breztri Take 2 puffs first thing in am and then another 2 puffs about 12 hours later. (replacing the flovent and stiolto) Prednisone 10 mg 2 daily until better then 1 daily x one week stop  Plan B = Backup (to supplement plan A, not to replace it) Only use your albuterol inhaler as a rescue medication Plan C =  Crisis (instead of Plan B but only if Plan B stops working) - only use your albuterol nebulizer if you first try Plan B and it fails to help Increase valsartan to 40 mg x 2 = 80 mg tablets on daily and avoid excess salt if possible       10/09/2020  f/u ov/Scott office/Scott Hubbard  Maint rx = breztri, not using saba as rec "they didn't work in pastTour manager Complaint  Patient presents with  . Follow-up    Breathing is worse since starting on Breztri. He is using his albuterol inhaler 6-8 x per day.   Dyspnea:  Room to room  Cough:rattling but not productive worse in am  Sleeping: on side/ one pillow  SABA use: not using approp  02: none  rec Change valsartan to 160-12.5 one daily  Plan A = Automatic = Always=    Stiolto 2 puffs each am  (the same as taking breztri 2 every 12 hours)  Prednisone 10 mg 2 daily until better then 1 daily until return   Plan B = Backup (to supplement plan A, not to replace it) Only use your albuterol inhaler as a rescue medication  Plan C = Crisis (instead of Plan B but only if Plan B stops working) - only use  your albuterol nebulizer if you first try Plan B   Please remember to go to the  x-ray department  > done 10/14/20 Underlying emphysematous change, stable, with mild scarring on the left. No edema or airspace opacity. Stable cardiac silhouette. Emphysema (ICD10-J43.9).       11/06/2020  f/u ov/Scott Hubbard re:  Copd GOLD IV / stiolto / pred 20 mg daily seemed to reduce saba dep Chief Complaint  Patient presents with  . Follow-up    productive cough with white phlegm  Dyspnea:  Room  To room  Cough: nothing unusual  For him, mostly mucoid worse in am   Sleeping: bed is flat one pillow L side down  SABA use: maybe less  02: none rec Pepcid 20 mg (famotidine)  One after supper GERD diet . Reduce the valsartan to 80-12.5 mg one daily when you refill it . Prednisone 10 mg one each am  until return  - if worse take two each am  Please schedule a follow up office visit in 6 weeks, call sooner if needed with all medications /inhalers/ solutions in hand so we can verify exactly what you are taking. This includes all medications from all doctors and over the counters  >>> [facility] pulmonary rehab on return    12/15/2020  f/u ov/Scott Hubbard re: GOLD IV / stiolto plus prednisone which not taking as rec and did not bring all meds  Chief Complaint  Patient presents with  . Follow-up    Shortness of breath with activity, non productive at times  Dyspnea: room to room / takes wagon to woodpile 75 ft stops half way  Cough: minimal mucoid  Sleeping: bed is flat/ on side / one pillow  SABA use: way too much both hfa and neb p over does it  02: none  Says heats house with wood stove but this is   No obvious day to day or daytime variability or assoc excess/ purulent sputum or mucus plugs or hemoptysis or cp or chest tightness, subjective wheeze or overt sinus or hb symptoms.   Sleeping  without nocturnal  or early am exacerbation  of respiratory  c/o's or need for noct saba.  Also denies any obvious fluctuation of symptoms with weather or environmental changes or other aggravating or alleviating factors except as outlined above   No unusual exposure hx or h/o childhood pna/ asthma or knowledge of premature birth.  Current Allergies, Complete Past Medical History, Past Surgical History, Family History, and Social History were reviewed in [employer] record.  ROS  The following are not active complaints unless bolded Hoarseness, sore throat, dysphagia, dental problems, itching, sneezing,  nasal congestion or discharge of excess mucus or purulent secretions, ear ache,   fever, chills, sweats, unintended wt loss or wt gain, classically pleuritic or exertional cp,  orthopnea pnd or arm/hand swelling  or leg swelling, presyncope, palpitations, abdominal pain, anorexia, nausea, vomiting, diarrhea  or change in bowel habits or change in bladder habits, change in stools or change in urine, dysuria, hematuria,  rash, arthralgias, visual complaints, headache, numbness, weakness or ataxia or problems with walking or coordination,  change in mood or  memory.        Current Meds  Medication Sig  . albuterol (PROVENTIL) (2.5 MG/3ML) 0.083% nebulizer solution Take 3 mLs (2.5 mg total) by nebulization every 4 (four) hours as needed for wheezing or shortness of breath.  Marland Kitchen albuterol (VENTOLIN HFA) 108 (90 Base) MCG/ACT inhaler INHALE 2 PUFFS INTO THE LUNGS EVERY 4 (FOUR) HOURS AS NEEDED FOR WHEEZING OR SHORTNESS OF BREATH.  . cyclobenzaprine (FLEXERIL) 10 MG tablet Take 1 tablet (10 mg total) by mouth 2 (two) times daily as needed for muscle spasms.  . famotidine (PEPCID) 20 MG tablet One after supper  . pantoprazole (PROTONIX) 40 MG tablet Take 1 tablet (40 mg total) by mouth daily. Take 30-60 min before first meal of the day  . predniSONE (DELTASONE) 10 MG tablet TAKE AS DIRECTED  . Tiotropium Bromide-Olodaterol (STIOLTO RESPIMAT) 2.5-2.5 MCG/ACT AERS Inhale 2  puffs into the lungs daily.  . valsartan-hydrochlorothiazide (DIOVAN HCT) 80-12.5 MG tablet Take 1 tablet by mouth daily.                                        Past Medical History:  Diagnosis Date  . Chest discomfort    negative stress echocardiogram-01/2011  . Chronic bronchitis   . COPD (chronic obstructive pulmonary disease) ([place])   . DJD (degenerative joint disease)   . Dyspnea   . Gastro-esophageal reflux disease with esophagitis   . GERD (gastroesophageal reflux disease)   . Hyperlipidemia   . Hypertension   . Migraine   . PVC (premature ventricular contraction)    Frequent  . Tobacco abuse   . Tobacco abuse    40 pack years       Objective:    12/15/2020    203  11/06/2020     200  09/08/2020       195   05/09/20 197 lb (89.4 kg)  04/30/20 197 lb (89.4 kg)  04/09/20 197 lb (89.4 kg)     Vital signs reviewed  11/06/2020  - Note at rest 02 sats  99% on RA       Edentulous  HEENT : pt wearing mask not removed for exam due to covid -19 concerns.    NECK :  without JVD/Nodes/TM/ nl carotid upstrokes bilaterally   LUNGS: no acc muscle use,  Mod barrel  contour chest wall with bilateral  Distant  bs s audible wheeze and  without cough on insp or exp maneuvers and mod  Hyperresonant  to  percussion bilaterally     CV:  RRR  no s3 or murmur or increase in P2, and no edema   ABD:  soft and nontender with pos mid insp Hoover's  in the supine position. No bruits or organomegaly appreciated, bowel sounds nl  MS:     ext warm without deformities, calf tenderness, cyanosis or clubbing No obvious joint restrictions   SKIN: warm and dry without lesions    NEURO:  alert, approp, nl sensorium with  no motor or cerebellar deficits apparent.       .          Assessment

## 2020-12-15 NOTE — Assessment & Plan Note (Addendum)
Quit smoking 2017  - Spirometry  01/04/17  FEV1 0.99 (23%)  Ratio 0.28 p ? Prior  = Allergy profile 04/09/20  >  Eos 0. 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 04/09/2020  MM   Level 156  - 04/09/2020 rec pred 20 until better then 10 mg daily x one week wean - 05/09/2020  After extensive coaching inhaler device,  effectiveness =    90% > rec  continue flovent 220/ stiolto and completely  taper off prednisone  - PFT's  06/24/20  FEV1 1.15 (26 % ) ratio 0.27  p 35 % improvement from saba p stiolto prior to study with DLCO  11.45 (35%) corrects to 1.88 (46%)  for alv volume and FV curve classic curvature  - 09/08/2020  After extensive coaching inhaler device,  effectiveness =    90% so try breztri  - 10/09/2020  After extensive coaching inhaler device,  effectiveness =    90% with smi > change back to stiolto and pred ceiling 20/floor 10 and added protonix 40 mg q am ac as pseudowheeze on exam - 11/06/2020 added pm doses of pepcid/ gerd recs including bed blocks   - .11/06/2020 pred floor of 10 mg / ceiling of 20 mg -  11/06/2020   Walked RA  approx   250 ft  @ avg pace  stopped due to  Sob/ fatigue with sats still 96%  Typical of a pink puffer    -  12/15/2020  After extensive coaching inhaler device,  effectiveness =    90%     Group D in terms of symptom/risk and laba/lama/ICS  therefore appropriate rx at this point >>>  stiolto and prednisone 10 mg x 2 each am until better and less dep on saba   Re saba: I spent extra time with pt today reviewing appropriate use of albuterol for prn use on exertion with the following points: 1) saba is for relief of sob that does not improve by walking a slower pace or resting but rather if the pt does not improve after trying this first. 2) If the pt is convinced, as many are, that saba helps recover from activity faster then it's easy to tell if this is the case by re-challenging : ie stop, take the inhaler, then p 5 minutes try the exact same activity (intensity of  workload) that just caused the symptoms and see if they are substantially diminished or not after saba 3) if there is an activity that reproducibly causes the symptoms(walking to the wood pile), try the saba 15 min before the activity on alternate days   If in fact the saba really does help, then fine to continue to use it prn but advised may need to look closer at the maintenance regimen being used to achieve better control of airways disease with exertion.

## 2020-12-21 ENCOUNTER — Other Ambulatory Visit: Payer: Self-pay | Admitting: Internal Medicine

## 2020-12-21 DIAGNOSIS — M62838 Other muscle spasm: Secondary | ICD-10-CM

## 2021-01-06 ENCOUNTER — Other Ambulatory Visit: Payer: Self-pay | Admitting: Internal Medicine

## 2021-01-06 MED ORDER — PANTOPRAZOLE SODIUM 40 MG PO TBEC: 40.0000 mg | DELAYED_RELEASE_TABLET | Freq: Every day | ORAL | 2 refills | Status: DC

## 2021-01-09 ENCOUNTER — Ambulatory Visit: Payer: Medicare Other | Admitting: Internal Medicine

## 2021-01-13 ENCOUNTER — Encounter: Payer: Self-pay | Admitting: General Surgery

## 2021-01-13 ENCOUNTER — Ambulatory Visit (INDEPENDENT_AMBULATORY_CARE_PROVIDER_SITE_OTHER): Payer: Medicare Other | Admitting: General Surgery

## 2021-01-13 ENCOUNTER — Other Ambulatory Visit: Payer: Self-pay

## 2021-01-13 VITALS — BP 102/72 | HR 93 | Temp 98.3°F | Resp 20 | Ht 77.0 in | Wt 202.0 lb

## 2021-01-13 DIAGNOSIS — K429 Umbilical hernia without obstruction or gangrene: Secondary | ICD-10-CM

## 2021-01-13 NOTE — Patient Instructions (Signed)
Ventral Hernia  A ventral hernia is a bulge of tissue from inside the abdomen that pushes through a weak area of the muscles that form the front wall of the abdomen. The tissues inside the abdomen are inside a sac (peritoneum). These tissues include the small intestine, large intestine, and the fatty tissue that covers the intestines (omentum). Sometimes, the bulge that forms a hernia contains intestines. Other hernias contain only fat. Ventral hernias do not go away without surgical treatment. There are several types of ventral hernias. You may have:  A hernia at an incision site from previous abdominal surgery (incisional hernia).  A hernia just above the belly button (epigastric hernia), or at the belly button (umbilical hernia). These types of hernias can develop from heavy lifting or straining.  A hernia that comes and goes (reducible hernia). It may be visible only when you lift or strain. This type of hernia can be pushed back into the abdomen (reduced).  A hernia that traps abdominal tissue inside the hernia (incarcerated hernia). This type of hernia does not reduce.  A hernia that cuts off blood flow to the tissues inside the hernia (strangulated hernia). The tissues can start to die if this happens. This is a very painful bulge that cannot be reduced. A strangulated hernia is a medical emergency. What are the causes? This condition is caused by abdominal tissue putting pressure on an area of weakness in the abdominal muscles. What increases the risk? The following factors may make you more likely to develop this condition:  Being age 26 or older.  Being overweight or obese.  Having had previous abdominal surgery, especially if there was an infection after surgery.  Having had an injury to the abdominal wall.  Frequently lifting or pushing heavy objects.  Having had several pregnancies.  Having a buildup of fluid inside the abdomen (ascites).  Straining to have a bowel  movement or to urinate.  Having frequent coughing episodes. What are the signs or symptoms? The only symptom of a ventral hernia may be a painless bulge in the abdomen. A reducible hernia may be visible only when you strain, cough, or lift. Other symptoms may include:  Dull pain.  A feeling of pressure. Signs and symptoms of a strangulated hernia may include:  Increasing pain.  Nausea and vomiting.  Pain when pressing on the hernia.  The skin over the hernia turning red or purple.  Constipation.  Blood in the stool (feces). How is this diagnosed? This condition may be diagnosed based on:  Your symptoms.  Your medical history.  A physical exam. You may be asked to cough or strain while standing. These actions increase the pressure inside your abdomen and force the hernia through the opening in your muscles. Your health care provider may try to reduce the hernia by gently pushing the hernia back in.  Imaging studies, such as an ultrasound or CT scan. How is this treated? This condition is treated with surgery. If you have a strangulated hernia, surgery is done as soon as possible. If your hernia is small and not incarcerated, you may be asked to lose some weight before surgery. Follow these instructions at home:  Follow instructions from your health care provider about eating or drinking restrictions.  If you are overweight, your health care provider may recommend that you increase your activity level and eat a healthier diet.  Do not lift anything that is heavier than 10 lb (4.5 kg), or the limit that you are told,  until your health care provider says that it is safe.  Return to your normal activities as told by your health care provider. Ask your health care provider what activities are safe for you. You may need to avoid activities that increase pressure on your hernia.  Take over-the-counter and prescription medicines only as told by your health care provider.  Keep  all follow-up visits. This is important. Contact a health care provider if:  Your hernia gets larger.  Your hernia becomes painful. Get help right away if:  Your hernia becomes increasingly painful.  You have pain along with any of the following: ? Changes in skin color in the area of the hernia. ? Nausea. ? Vomiting. ? Fever. These symptoms may represent a serious problem that is an emergency. Do not wait to see if the symptoms will go away. Get medical help right away. Call your local emergency services (911 in the [place] Do not drive yourself to the hospital. Summary  A ventral hernia is a bulge of tissue from inside the abdomen that pushes through a weak area of the muscles that form the front wall of the abdomen.  This condition is treated with surgery, which may be urgent depending on your hernia.  Do not lift anything that is heavier than 10 lb (4.5 kg), and follow activity instructions from your health care provider. This information is not intended to replace advice given to you by your health care provider. Make sure you discuss any questions you have with your health care provider. Document Revised: 07/25/2020 Document Reviewed: 07/25/2020 Elsevier Patient Education  2021 [place]. Lipoma  A lipoma is a noncancerous (benign) tumor that is made up of fat cells. This is a very common type of soft-tissue growth. Lipomas are usually found under the skin (subcutaneous). They may occur in any tissue of the body that contains fat. Common areas for lipomas to appear include the back, arms, shoulders, buttocks, and thighs. Lipomas grow slowly, and they are usually painless. Most lipomas do not cause problems and do not require treatment. What are the causes? The cause of this condition is not known. What increases the risk? You are more likely to develop this condition if:  You are 29-59 years old.  You have a family history of lipomas. What are the signs or symptoms? A  lipoma usually appears as a small, round bump under the skin. In most cases, the lump will:  Feel soft or rubbery.  Not cause pain or other symptoms. However, if a lipoma is located in an area where it pushes on nerves, it can become painful or cause other symptoms. How is this diagnosed? A lipoma can usually be diagnosed with a physical exam. You may also have tests to confirm the diagnosis and to rule out other conditions. Tests may include:  Imaging tests, such as a CT scan or an MRI.  Removal of a tissue sample to be looked at under a microscope (biopsy). How is this treated? Treatment for this condition depends on the size of the lipoma and whether it is causing any symptoms.  For small lipomas that are not causing problems, no treatment is needed.  If a lipoma is bigger or it causes problems, surgery may be done to remove the lipoma. Lipomas can also be removed to improve appearance. Most often, the procedure is done after applying a medicine that numbs the area (local anesthetic).  Liposuction may be done to reduce the size of the lipoma before it  is removed through surgery, or it may be done to remove the lipoma. Lipomas are removed with this method in order to limit incision size and scarring. A liposuction tube is inserted through a small incision into the lipoma, and the contents of the lipoma are removed through the tube with suction. Follow these instructions at home:  Watch your lipoma for any changes.  Keep all follow-up visits as told by your health care provider. This is important. Contact a health care provider if:  Your lipoma becomes larger or hard.  Your lipoma becomes painful, red, or increasingly swollen. These could be signs of infection or a more serious condition. Get help right away if:  You develop tingling or numbness in an area near the lipoma. This could indicate that the lipoma is causing nerve damage. Summary  A lipoma is a noncancerous tumor that  is made up of fat cells.  Most lipomas do not cause problems and do not require treatment.  If a lipoma is bigger or it causes problems, surgery may be done to remove the lipoma.  Contact a health care provider if your lipoma becomes larger or hard, or if it becomes painful, red, or increasingly swollen. Pain, redness, and swelling could be signs of infection or a more serious condition. This information is not intended to replace advice given to you by your health care provider. Make sure you discuss any questions you have with your health care provider. Document Revised: 07/23/2019 Document Reviewed: 07/23/2019 Elsevier Patient Education  [place].

## 2021-01-13 NOTE — Progress Notes (Signed)
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 He states he has had both for many years. He denies any significant change in size of the lipoma in his right axilla. He also has an umbilical hernia which is sometimes tender to touch, but he denies any nausea or vomiting. He has significant COPD and states his breathing status is difficult right now due to the weather. Past Medical History:  Diagnosis Date  . Chest discomfort    negative stress echocardiogram-01/2011  . Chronic bronchitis   . COPD (chronic obstructive pulmonary disease) ([place])   . DJD (degenerative joint disease)   . Gastro-esophageal reflux disease with esophagitis   . GERD (gastroesophageal reflux disease)   . Hyperlipidemia   . Hypertension   . Migraine   . PVC (premature ventricular contraction)    Frequent  . Tobacco abuse    40 pack years    Past Surgical History:  Procedure Laterality Date  . None      History reviewed. No pertinent family history.  Current Outpatient Medications on File Prior to Visit  Medication Sig Dispense Refill  . albuterol (PROVENTIL) (2.5 MG/3ML) 0.083% nebulizer solution Take 3 mLs (2.5 mg total) by nebulization every 4 (four) hours as needed for wheezing or shortness of breath. 75 mL 12  . albuterol (VENTOLIN HFA) 108 (90 Base) MCG/ACT inhaler INHALE 2 PUFFS INTO THE LUNGS EVERY 4 (FOUR) HOURS AS NEEDED FOR WHEEZING OR SHORTNESS OF BREATH. 8.5 g 2  . cyclobenzaprine (FLEXERIL) 10 MG tablet TAKE 1 TABLET BY MOUTH TWICE A DAY AS NEEDED FOR MUSCLE SPASMS 60 tablet 2  . famotidine (PEPCID) 20 MG tablet One after supper 30 tablet 11  . pantoprazole (PROTONIX) 40 MG tablet Take 1 tablet (40 mg total) by mouth daily. Take 30-60 min before first meal of the day 30 tablet 2  . predniSONE (DELTASONE) 10 MG tablet TAKE AS DIRECTED 100  tablet 0  . Tiotropium Bromide-Olodaterol (STIOLTO RESPIMAT) 2.5-2.5 MCG/ACT AERS Inhale 2 puffs into the lungs daily. 1 each 11  . valsartan-hydrochlorothiazide (DIOVAN HCT) 80-12.5 MG tablet Take 1 tablet by mouth daily. 30 tablet 11   No current facility-administered medications on file prior to visit.    No Known Allergies  Social History   Substance and Sexual Activity  Alcohol Use No    Social History   Tobacco Use  Smoking Status Former Smoker  . Packs/day: 1.50  . Years: 40.00  . Pack years: 60.00  . Quit date: 04/27/2016  . Years since quitting: 4.7  Smokeless Tobacco Never Used    Review of Systems  Constitutional: Positive for weight loss.  HENT: Positive for sore throat.   Eyes: Negative.   Respiratory: Positive for cough, shortness of breath and wheezing.   Cardiovascular: Negative.   Gastrointestinal: Positive for abdominal pain.  Genitourinary: Negative.   Musculoskeletal: Positive for back pain.  Skin: Negative.   Neurological: Positive for dizziness.  Endo/Heme/Allergies: Negative.   Psychiatric/Behavioral: Negative.     Objective   Vitals:   01/13/21 1256  BP: 102/72  Pulse: 93  Resp: 20  Temp: 98.3 F (36.8 C)  SpO2: 92%    Physical Exam Vitals reviewed.  Constitutional:      Appearance: Normal appearance. He is normal weight. He is not ill-appearing.  HENT:  Head: Normocephalic and atraumatic.  Cardiovascular:     Rate and Rhythm: Normal rate and regular rhythm.     Heart sounds: Normal heart sounds. No murmur heard. No friction rub. No gallop.   Pulmonary:     Effort: Pulmonary effort is normal. No respiratory distress.     Breath sounds: No stridor. Wheezing present. No rhonchi or rales.  Abdominal:     General: There is no distension.     Palpations: Abdomen is soft. There is no mass.     Tenderness: There is no abdominal tenderness. There is no guarding or rebound.     Hernia: A hernia is present.     Comments: Reducible  umbilical hernia.  Skin:    General: Skin is warm and dry.     Comments: Large greater than 8 cm oval subcutaneous rubbery mobile mass present extending from the right axilla posteriorly.  Neurological:     Mental Status: He is alert and oriented to person, place, and time.     Assessment  Umbilical hernia, reducible Large soft tissue mass, right axilla, probable lipoma COPD, currently not well controlled Plan   Patient states he does not want surgical invention unless absolutely necessary. He has had the right axillary mass which is very large but not significantly changed over the past few years. He states is been present for many decades. The chance that this is a malignancy is low. He understands that I cannot give him 100% guarantee of that without removal. He does not want an umbilical hernia repair at this time, which is fine with me. He was told should he develop worsening umbilical pain or swelling, he should proceed with surgery. He also understands to monitor the axillary mass. Follow-up as needed. Literature was given.

## 2021-01-19 ENCOUNTER — Other Ambulatory Visit: Payer: Self-pay | Admitting: Internal Medicine

## 2021-01-20 ENCOUNTER — Other Ambulatory Visit: Payer: Self-pay | Admitting: Internal Medicine

## 2021-01-20 MED ORDER — ALBUTEROL SULFATE HFA 108 (90 BASE) MCG/ACT IN AERS: 2.0000 | INHALATION_SPRAY | RESPIRATORY_TRACT | 2 refills | Status: DC | PRN

## 2021-01-28 ENCOUNTER — Other Ambulatory Visit: Payer: Self-pay

## 2021-01-28 ENCOUNTER — Encounter: Payer: Self-pay | Admitting: Internal Medicine

## 2021-01-28 ENCOUNTER — Ambulatory Visit (INDEPENDENT_AMBULATORY_CARE_PROVIDER_SITE_OTHER): Payer: Medicare Other | Admitting: Internal Medicine

## 2021-01-28 VITALS — BP 110/64 | HR 106 | Temp 98.5°F | Ht 77.0 in | Wt 203.6 lb

## 2021-01-28 DIAGNOSIS — J449 Chronic obstructive pulmonary disease, unspecified: Secondary | ICD-10-CM

## 2021-01-28 NOTE — Patient Instructions (Addendum)
Plan A = Automatic = Always=   Stiolto 2 puffs each am   Prednisone 10 mg x 2 daily until better then 1 daily  - always take with breakfast - if worse then double it back until better   Plan B = Backup (to supplement plan A, not to replace it) Only use your albuterol inhaler as a rescue medication to be used if you can't catch your breath by resting or doing a relaxed purse lip breathing pattern.  - The less you use it, the better it will work when you need it. - Ok to use the inhaler up to 2 puffs  every 4 hours if you must but call for appointment if use goes up over your usual need - Don't leave home without it !!  (think of it like the spare tire for your car)   Plan C = Crisis (instead of Plan B but only if Plan B stops working) - only use your albuterol nebulizer if you first try Plan B and it fails to help > ok to use the nebulizer up to every 4 hours but if start needing it regularly call for immediate appointment  Ok to Try albuterol (first the [facility] one day and the nebulizer the next and then nothing the next time) 15 min before an activity that you know would make you short of breath and see if it makes any difference and if makes none then don't take it after activity unless you can't catch your breath.      We will be referring you to pulmonary rehab.   Please schedule a follow up visit in 3 months but call sooner if needed        Plan A = Automatic = Always=   Stiolto 2 puffs each am   Prednisone 10 mg x 2 daily until better then 1 daily  - always take with breakfast   Plan B = Backup (to supplement plan A, not to replace it) Only use your albuterol inhaler as a rescue medication to be used if you can't catch your breath by resting or doing a relaxed purse lip breathing pattern.  - The less you use it, the better it will work when you need it. - Ok to use the inhaler up to 2 puffs  every 4 hours if you must but call for appointment if use goes up over your usual  need - Don't leave home without it !!  (think of it like the spare tire for your car)   Plan C = Crisis (instead of Plan B but only if Plan B stops working) - only use your albuterol nebulizer if you first try Plan B and it fails to help > ok to use the nebulizer up to every 4 hours but if start needing it regularly call for immediate appointment  Ok to Try albuterol (first the [facility] one day and the nebulizer the next) 15 min before an activity that you know would make you short of breath and see if it makes any difference and if makes none then don't take it after activity unless you can't catch your breath.      We will be referring you to pulmonary rehab.   Please schedule a follow up office visit in 6 weeks, call sooner if needed

## 2021-01-28 NOTE — Progress Notes (Signed)
Scott Hubbard, male    DOB: 04-02-1956    MRN: [id number]   Brief patient profile:  75 yowm MM/quit smoking 04/2016 @ onset of severe spell of sob and since then doe gradually worse despite rx with flovent/ stiolto so referred to pulmonary clinic 04/09/2020 by Dr  Scott Hubbard     History of Present Illness  04/09/2020  Pulmonary/ 1st office eval/Scott Hubbard  Chief Complaint  Patient presents with  . Pulmonary Consult    Referred by Dr. Benny Hubbard for eval of COPD. Former pt of Dr [name]. He states he gets SOB "when I do anything".    Dyspnea:  [employer] x 2 aisles but walks "faster than other people" (walked very slowly in office) so MMRC3 = can't walk 100 yards even at a slow pace at a flat grade s stopping due to sob    Last time mb and back was prior to prior to when he quit smoking as has long driveway with incline on way back  Cough: none Sleep: flat bed / one pillow L side s respiratory symptoms  SABA use: 2 pffs each am / does not know how when to use hfa vs neb     rec Plan A = Automatic = Always=    Stiolto x 2 pffs/ flovent x 2puffs  first thing in am and repeat flovent in pm  Prednisone 10 mg x 2 daily until breathing better then 1 daily x a week and stop (#60)     Take valsartan 40 mg x 2 pills daily until you see your cardiologist            Plan B = Backup (to supplement plan A, not to replace it) Only use your albuterol inhaler as a rescue medication    05/09/2020  f/u ov/Scott Hubbard re: GOLD IV/ group D symptom/risk still on pred 10 mg daily  Chief Complaint  Patient presents with  . Follow-up    Breathing is some better since the last visit. She is using her albuterol inhaler 1-2 x per day and has not needed neb.   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 Follow-up    Patient feels like his breathing is worse since last visit, shortness of breath with exertion, productive cough with white sputum, spouse states he eats salt like crazy.    Dyspnea:   Walking at [employer] x only  one aisle  = MMRC3 = can't walk 100 yards even at a slow pace at a flat grade s stopping due to sob   Cough: usual worse in am / mucoid  Sleeping: flat bed one pillow prefer L side down  SABA use: rarely  02: none  rec Plan A = Automatic = Always=    Breztri Take 2 puffs first thing in am and then another 2 puffs about 12 hours later. 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 Follow-up    Breathing is worse since starting on Breztri. He is using his albuterol inhaler 6-8 x per day.   Dyspnea:  Room to room  Cough:rattling but not productive worse in am  Sleeping: on side/ one pillow  SABA use: not using approp  02: none  rec Change valsartan to 160-12.5 one daily  Plan A = Automatic = Always=    Stiolto 2 puffs each am  (the same as taking breztri 2 every 12 hours)  Prednisone 10 mg 2 daily until better then 1 daily until return   Plan B = Backup (to supplement plan A, not to replace it) Only use your albuterol inhaler as a rescue medication  Plan C = Crisis (instead of Plan B but only if Plan B stops working) - only use  your albuterol nebulizer if you first try Plan B   Please remember to go to the  x-ray department  > done 10/14/20 Underlying emphysematous change, stable, with mild scarring on the left. No edema or airspace opacity. Stable cardiac silhouette. Emphysema (ICD10-J43.9).       11/06/2020  f/u ov/Scott Hubbard office/Kevan Hubbard re:  Copd GOLD IV / stiolto / pred 20 mg daily seemed to reduce saba dep Chief Complaint  Patient presents with  . Follow-up    productive cough with white phlegm  Dyspnea:  Room  To room  Cough: nothing unusual  For him, mostly mucoid worse in am   Sleeping: bed is flat one pillow L side down  SABA use: maybe less  02: none  rec Plan A = Automatic = Always=   Stiolto 2 puffs each am  Prednisone 10 mg x 2 daily until better then 1 daily  - always take with breakfast  Plan B = Backup (to supplement plan A, not to replace it) Only use your albuterol inhaler as a rescue medication   Plan C = Crisis (instead of Plan B but only if Plan B stops working) - only use your albuterol nebulizer if you first try Plan B and it fails to help > ok to use the nebulizer up to every 4 hours but if start needing it regularly call for immediate appointment Ok to Try albuterol (first the [facility] one day and the nebulizer the next) 15 min before an activity     We will be referring you to pulmonary rehab.      01/28/2021  f/u ov/Scott Hubbard office/Scott Hubbard re: GOLD IV / stiolto x 2 / prednisone 10 mg x one  Chief Complaint  Patient presents with  . Follow-up    Intermittent Productive cough with little bit clear phlegm  Dyspnea:  Room to room  Cough: sporadic during the day  Sleeping: L side down, bed is flat  SABA use:  Has not used it on day of ov/  02: none  Covid status: vax max  Lung cancer screening: not eligible based on copd severity    No obvious day to day  or daytime variability or assoc  purulent sputum or mucus plugs or hemoptysis or cp or chest tightness, subjective wheeze or  overt sinus or hb symptoms.   Sleeping  without nocturnal  or early am exacerbation  of respiratory  c/o's or need for noct saba. Also denies any obvious fluctuation of symptoms with weather or environmental changes or other aggravating or alleviating factors except as outlined above   No unusual exposure hx or h/o childhood pna/ asthma or knowledge of premature birth.  Current Allergies, Complete Past Medical History, Past Surgical History, Family History, and Social History were reviewed in [employer] record.  ROS  The following are not active complaints unless bolded Hoarseness, sore throat, dysphagia, dental problems, itching, sneezing,  nasal congestion or discharge of excess mucus or purulent secretions, ear ache,   fever, chills, sweats, unintended wt loss or wt gain, classically pleuritic or exertional cp,  orthopnea pnd or arm/hand swelling  or leg swelling, presyncope, palpitations, abdominal pain, anorexia, nausea, vomiting, diarrhea  or change in bowel habits or change in bladder habits, change in stools or change in urine, dysuria, hematuria,  rash, arthralgias, visual complaints, headache, numbness, weakness or ataxia or problems with walking or coordination,  change in mood or  memory.        Current Meds  Medication Sig  . albuterol (PROVENTIL) (2.5 MG/3ML) 0.083% nebulizer solution Take 3 mLs (2.5 mg total) by nebulization every 4 (four) hours as needed for wheezing or shortness of breath.  Marland Kitchen albuterol (VENTOLIN HFA) 108 (90 Base) MCG/ACT inhaler Inhale 2 puffs into the lungs every 4 (four) hours as needed for wheezing or shortness of breath.  . cyclobenzaprine (FLEXERIL) 10 MG tablet TAKE 1 TABLET BY MOUTH TWICE A DAY AS NEEDED FOR MUSCLE SPASMS  . famotidine (PEPCID) 20 MG tablet One after supper  . pantoprazole (PROTONIX) 40 MG tablet TAKE 1 TABLET (40 MG TOTAL) BY MOUTH DAILY. TAKE 30-60 MIN BEFORE FIRST MEAL OF THE DAY  . predniSONE (DELTASONE) 10 MG  tablet TAKE AS DIRECTED  . Tiotropium Bromide-Olodaterol (STIOLTO RESPIMAT) 2.5-2.5 MCG/ACT AERS Inhale 2 puffs into the lungs daily.  . valsartan-hydrochlorothiazide (DIOVAN HCT) 80-12.5 MG tablet Take 1 tablet by mouth daily.                                                            Past Medical History:  Diagnosis Date  . Chest discomfort    negative stress echocardiogram-01/2011  . Chronic bronchitis   . COPD (chronic obstructive pulmonary disease) ([place])   . DJD (degenerative joint disease)   . Dyspnea   . Gastro-esophageal reflux disease with esophagitis   . GERD (gastroesophageal reflux disease)   . Hyperlipidemia   . Hypertension   . Migraine   . PVC (premature ventricular contraction)    Frequent  . Tobacco abuse   . Tobacco abuse    40 pack years       Objective:    01/28/2021         203 11/06/2020      200  09/08/2020       195   05/09/20 197 lb (89.4 kg)  04/30/20 197 lb (89.4 kg)  04/09/20 197 lb (89.4 kg)      Vital signs reviewed  01/28/2021  -  Note at rest 02 sats  93% on RA   General appearance:   Chronically ill wm nad     Reports: Edentulous  HEENT : pt wearing mask not removed for exam due to covid -19 concerns.    NECK :  without JVD/Nodes/TM/ nl carotid upstrokes bilaterally   LUNGS: no acc muscle use,  Mod barrel  contour chest wall with bilateral  Distant bs s audible wheeze and  without cough on insp or exp maneuvers and mod  Hyperresonant  to  percussion bilaterally     CV:  RRR  no s3 or murmur or increase in P2, and no edema   ABD:  soft and nontender with pos mid insp Hoover's  in the supine position. No bruits or organomegaly appreciated, bowel sounds nl  MS:     ext warm without deformities, calf tenderness, cyanosis or clubbing No obvious joint restrictions   SKIN: warm and dry without lesions    NEURO:  alert, approp, nl sensorium with  no motor or cerebellar deficits apparent.                 Assessment

## 2021-01-29 ENCOUNTER — Encounter: Payer: Self-pay | Admitting: Internal Medicine

## 2021-01-29 NOTE — Assessment & Plan Note (Addendum)
Quit smoking 2017  - Spirometry  01/04/17  FEV1 0.99 (23%)  Ratio 0.28 p ? Prior  - Allergy profile 04/09/20  >  Eos 0. 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 04/09/2020  MM   Level 156  - 04/09/2020 rec pred 20 until better then 10 mg daily x one week wean - 05/09/2020  After extensive coaching inhaler device,  effectiveness =    90% > rec  continue flovent 220/ stiolto and completely  taper off prednisone  - PFT's  06/24/20  FEV1 1.15 (26 % ) ratio 0.27  p 35 % improvement from saba p stiolto prior to study with DLCO  11.45 (35%) corrects to 1.88 (46%)  for alv volume and FV curve classic curvature  - 09/08/2020  After extensive coaching inhaler device,  effectiveness =    90% so try breztri  - 10/09/2020  After extensive coaching inhaler device,  effectiveness =    90% with smi > change back to stiolto and pred ceiling 20/floor 10 and added protonix 40 mg q am ac as pseudowheeze on exam - 11/06/2020 added pm doses of pepcid/ gerd recs including bed blocks   - .11/06/2020 pred floor of 10 mg / ceiling of 20 mg -  11/06/2020   Walked RA  approx   250 ft  @ avg pace  stopped due to  Sob/ fatigue with sats still 96%    -  12/15/2020  After extensive coaching inhaler device,  effectiveness =    90%  -  01/28/2021   Walked RA  approx   400 ft  @ slow to moderate pace  stopped due to  Sob  With sats 94% still  typical of a pink puffer     Group D in terms of symptom/risk and laba/lama/ICS  therefore appropriate rx at this point >>>  stiolto plus prednisone has reduced use of saba but not restored him to much of an improved functional level > rec rehab next / purse lip breathing at paced rate   Re saba I spent extra time with pt today reviewing appropriate use of albuterol for prn use on exertion with the following points: 1) saba is for relief of sob that does not improve by walking a slower pace or resting but rather if the pt does not improve after trying this first. 2) If the pt is convinced, as many are,  that saba helps recover from activity faster then it's easy to tell if this is the case by re-challenging : ie stop, take the inhaler, then p 5 minutes try the exact same activity (intensity of workload) that just caused the symptoms and see if they are substantially diminished or not after saba 3) if there is an activity that reproducibly causes the symptoms, try the saba 15 min before the activity on alternate days   If in fact the saba really does help, then fine to continue to use it prn but advised may need to look closer at the maintenance regimen being used to achieve better control of airways disease with exertion.   Each maintenance medication was reviewed in detail including emphasizing most importantly the difference between maintenance and prns and under what circumstances the prns are to be triggered using an action plan format where appropriate.  Total time for H and P, chart review, counseling, reviewing smi and hfa  device(s) , directly observing portions of ambulatory 02 saturation study/ and generating customized AVS unique to this  office visit / same day charting = 24 min

## 2021-03-03 ENCOUNTER — Ambulatory Visit (INDEPENDENT_AMBULATORY_CARE_PROVIDER_SITE_OTHER): Payer: Medicare Other | Admitting: Internal Medicine

## 2021-03-03 ENCOUNTER — Encounter: Payer: Self-pay | Admitting: Internal Medicine

## 2021-03-03 ENCOUNTER — Other Ambulatory Visit: Payer: Self-pay

## 2021-03-03 VITALS — BP 121/79 | HR 84 | Resp 18 | Ht 77.0 in | Wt 201.4 lb

## 2021-03-03 DIAGNOSIS — I10 Essential (primary) hypertension: Secondary | ICD-10-CM

## 2021-03-03 DIAGNOSIS — L989 Disorder of the skin and subcutaneous tissue, unspecified: Secondary | ICD-10-CM

## 2021-03-03 DIAGNOSIS — J449 Chronic obstructive pulmonary disease, unspecified: Secondary | ICD-10-CM | POA: Diagnosis not present

## 2021-03-03 DIAGNOSIS — L309 Dermatitis, unspecified: Secondary | ICD-10-CM | POA: Diagnosis not present

## 2021-03-03 DIAGNOSIS — J441 Chronic obstructive pulmonary disease with (acute) exacerbation: Secondary | ICD-10-CM

## 2021-03-03 MED ORDER — METHYLPREDNISOLONE SODIUM SUCC 125 MG IJ SOLR
125.0000 mg | Freq: Once | INTRAMUSCULAR | Status: AC
  Administered 2021-03-03: 125 mg via INTRAMUSCULAR

## 2021-03-03 MED ORDER — TRIAMCINOLONE ACETONIDE 0.1 % EX CREA: 1.0000 "application " | TOPICAL_CREAM | Freq: Two times a day (BID) | CUTANEOUS | 0 refills | Status: DC

## 2021-03-03 MED ORDER — PREDNISONE 10 MG (21) PO TBPK: ORAL_TABLET | ORAL | 0 refills | Status: DC

## 2021-03-03 NOTE — Progress Notes (Signed)
Established Patient Office Visit  Subjective:  Patient ID: Scott Hubbard, male    DOB: March 14, 1956  Age: 65 y.o. MRN: [id number]  CC:  Chief Complaint  Patient presents with  . Follow-up    Follow up sob has been going on for about 6 months    HPI Kuper A Sasso is a 65 year old male with past medical history of COPD, uncontrolled hypertension and previous tobacco abuse who presents for follow up of his chronic medical conditions.  HTN: BP is well-controlled. Takes medications regularly. Patient denies headache, dizziness, chest pain, dyspnea or palpitations.  COPD: He has been having severe dyspnea and wheezing for last 2-3 weeks. Has been having worsening cough with increased expectoration - clear mucus. He has been using Stiolto and PRN Albuterol. He has been on oral steroids as well. Denies any fever, chills, sore throat or nasal congestion. Denies any recent sick contacts.  He has been having red patches over his b/l UE. Denies any itching. Denies any recent tick/insect bite.  Past Medical History:  Diagnosis Date  . Chest discomfort    negative stress echocardiogram-01/2011  . Chronic bronchitis   . COPD (chronic obstructive pulmonary disease) ([place])   . DJD (degenerative joint disease)   . Gastro-esophageal reflux disease with esophagitis   . GERD (gastroesophageal reflux disease)   . Hyperlipidemia   . Hypertension   . Migraine   . PVC (premature ventricular contraction)    Frequent  . Tobacco abuse    40 pack years    Past Surgical History:  Procedure Laterality Date  . None      History reviewed. No pertinent family history.  Social History   Socioeconomic History  . Marital status: Legally Separated    Spouse name: Not on file  . Number of children: 2  . Years of education: Not on file  . Highest education level: Not on file  Occupational History  . Occupation: unemployed    Fish farm manager: UNEMPLOYED  Tobacco Use  . Smoking status: Former Smoker     Packs/day: 1.50    Years: 40.00    Pack years: 60.00    Quit date: 04/27/2016    Years since quitting: 4.8  . Smokeless tobacco: Never Used  Vaping Use  . Vaping Use: Never used  Substance and Sexual Activity  . Alcohol use: No  . Drug use: No  . Sexual activity: Not on file  Other Topics Concern  . Not on file  Social History Narrative   Lives with girlfriend.  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     Social Determinants of Health   Financial Resource Strain: Not on file  Food Insecurity: Not on file  Transportation Needs: Not on file  Physical Activity: Not on file  Stress: Not on file  Social Connections: Not on file  Intimate Partner Violence: Not on file    Outpatient Medications Prior to Visit  Medication Sig Dispense Refill  . albuterol (PROVENTIL) (2.5 MG/3ML) 0.083% nebulizer solution Take 3 mLs (2.5 mg total) by nebulization every 4 (four) hours as needed for wheezing or shortness of breath. 75 mL 12  . albuterol (VENTOLIN HFA) 108 (90 Base) MCG/ACT inhaler Inhale 2 puffs into the lungs every 4 (four) hours as needed for wheezing or shortness of breath. 8.5 g 2  . cyclobenzaprine (FLEXERIL) 10 MG tablet TAKE 1 TABLET BY MOUTH TWICE A DAY AS NEEDED FOR MUSCLE SPASMS 60 tablet 2  . famotidine (PEPCID) 20 MG tablet One after supper  30 tablet 11  . pantoprazole (PROTONIX) 40 MG tablet TAKE 1 TABLET (40 MG TOTAL) BY MOUTH DAILY. TAKE 30-60 MIN BEFORE FIRST MEAL OF THE DAY 90 tablet 3  . predniSONE (DELTASONE) 10 MG tablet TAKE AS DIRECTED 100 tablet 0  . Tiotropium Bromide-Olodaterol (STIOLTO RESPIMAT) 2.5-2.5 MCG/ACT AERS Inhale 2 puffs into the lungs daily. 1 each 11  . valsartan-hydrochlorothiazide (DIOVAN HCT) 80-12.5 MG tablet Take 1 tablet by mouth daily. 30 tablet 11   No facility-administered medications prior to visit.    No Known Allergies  ROS Review of Systems  Constitutional: Negative for chills and fever.  HENT: Negative for congestion, sinus pressure, sinus  pain and sore throat.   Eyes: Negative for pain and discharge.  Respiratory: Positive for shortness of breath and wheezing. Negative for cough.   Cardiovascular: Negative for chest pain and palpitations.  Gastrointestinal: Negative for constipation, diarrhea, nausea and vomiting.  Endocrine: Negative for polydipsia and polyuria.  Genitourinary: Negative for dysuria and hematuria.  Musculoskeletal: Negative for neck pain and neck stiffness.  Skin: Positive for rash (b/l UE).  Neurological: Negative for dizziness, weakness, numbness and headaches.  Psychiatric/Behavioral: Negative for agitation and behavioral problems.      Objective:    Physical Exam Vitals reviewed.  Constitutional:      General: He is not in acute distress.    Appearance: He is not diaphoretic.  HENT:     Head: Normocephalic and atraumatic.     Nose: Nose normal.     Mouth/Throat:     Mouth: Mucous membranes are moist.  Eyes:     General: No scleral icterus.    Extraocular Movements: Extraocular movements intact.     Pupils: Pupils are equal, round, and reactive to light.  Cardiovascular:     Rate and Rhythm: Normal rate and regular rhythm.     Pulses: Normal pulses.     Heart sounds: No murmur heard. No friction rub. No gallop.   Pulmonary:     Effort: Pulmonary effort is normal. No respiratory distress.     Breath sounds: Normal breath sounds. No rales. Wheezes: Diffuse (b/l)  Abdominal:     General: Bowel sounds are normal.     Palpations: Abdomen is soft.     Tenderness: There is no abdominal tenderness.     Hernia: A hernia (Umbilical) is present.  Musculoskeletal:        General: No swelling or signs of injury.     Cervical back: Neck supple. No rigidity or tenderness.     Right lower leg: No edema.     Left lower leg: No edema.  Skin:    General: Skin is warm.     Findings: Rash (Erythematous plaques over b/l UE) present.  Neurological:     General: No focal deficit present.     Mental  Status: He is alert and oriented to person, place, and time.     Sensory: No sensory deficit.     Motor: No weakness.  Psychiatric:        Mood and Affect: Mood normal.        Behavior: Behavior normal.     BP 121/79 (BP Location: Right Arm, Patient Position: Sitting, Cuff Size: Normal)   Pulse 84   Resp 18   Ht 6' 5" (1.956 m)   Wt 201 lb 6.4 oz (91.4 kg)   SpO2 94%   BMI 23.88 kg/m  Wt Readings from Last 3 Encounters:  03/03/21 201 lb 6.4 oz (  91.4 kg)  01/28/21 203 lb 9.6 oz (92.4 kg)  01/13/21 202 lb (91.6 kg)     Health Maintenance Due  Topic Date Due  . Hepatitis C Screening  Never done  . PNA vac Low Risk Adult (1 of 2 - PCV13) Never done  . COVID-19 Vaccine (3 - Booster for Moderna series) 10/23/2020    There are no preventive care reminders to display for this patient.  Lab Results  Component Value Date   TSH 0.602 04/09/2020   Lab Results  Component Value Date   WBC 7.0 04/09/2020   HGB 14.3 04/09/2020   HCT 44.3 04/09/2020   MCV 90.2 04/09/2020   PLT 230 04/09/2020   Lab Results  Component Value Date   NA 135 04/09/2020   K 4.4 04/09/2020   CO2 29 04/09/2020   GLUCOSE 108 (H) 04/09/2020   BUN 19 04/09/2020   CREATININE 1.22 04/09/2020   BILITOT 0.6 09/27/2019   ALKPHOS 76 01/12/2011   AST 14 09/27/2019   ALT 10 09/27/2019   PROT 6.9 09/27/2019   ALBUMIN 4.3 01/12/2011   CALCIUM 8.8 (L) 04/09/2020   ANIONGAP 8 04/09/2020   Lab Results  Component Value Date   CHOL 207 01/12/2011   Lab Results  Component Value Date   HDL 38 01/12/2011   Lab Results  Component Value Date   LDLCALC 21 01/12/2011   Lab Results  Component Value Date   TRIG 106 01/12/2011   No results found for: CHOLHDL Lab Results  Component Value Date   HGBA1C 5.7 (H) 09/27/2019      Assessment & Plan:   Problem List Items Addressed This Visit      Cardiovascular and Mediastinum   Essential hypertension - Primary    BP Readings from Last 1 Encounters:   03/03/21 121/79   Well-controlled with Valsartan-HCTZ Counseled for compliance with the medications Advised DASH diet and moderate exercise/walking, at least 150 mins/week       Relevant Orders   CBC   CMP14+EGFR   Lipid panel   TSH   Hemoglobin A1c     Respiratory   COPD GOLD IV/ Group D with acute exacerbation SoluMedrol given in the office Sterapred dosepak, followed by Prednisone 10 mg QD Continue Stiolto and PRN Albuterol F/u with Pulmonology   Relevant Medications   predniSONE (STERAPRED UNI-PAK 21 TAB) 10 MG (21) TBPK tablet   Other Relevant Orders   CMP14+EGFR     Musculoskeletal and Integument   Eczema    Prescribed Kenalog ointment Referred to Dermatology      Relevant Medications   triamcinolone (KENALOG) 0.1 %   Other Relevant Orders   Ambulatory referral to Dermatology    Other Visit Diagnoses    Skin lesions     Concerning for precancerous lesions Referred to Dermatology for possible biopsy   Relevant Orders   Ambulatory referral to Dermatology      Meds ordered this encounter  Medications  . triamcinolone (KENALOG) 0.1 %    Sig: Apply 1 application topically 2 (two) times daily.    Dispense:  30 g    Refill:  0  . predniSONE (STERAPRED UNI-PAK 21 TAB) 10 MG (21) TBPK tablet    Sig: Take as package instructions.    Dispense:  1 each    Refill:  0  . methylPREDNISolone sodium succinate (SOLU-MEDROL) 125 mg/2 mL injection 125 mg    Follow-up: Return in about 4 months (around 07/03/2021).    Kimberly Nieland K  Posey Pronto, MD

## 2021-03-03 NOTE — Assessment & Plan Note (Signed)
Prescribed Kenalog ointment Referred to Dermatology

## 2021-03-03 NOTE — Assessment & Plan Note (Signed)
BP Readings from Last 1 Encounters:  03/03/21 121/79   Well-controlled with Valsartan-HCTZ Counseled for compliance with the medications Advised DASH diet and moderate exercise/walking, at least 150 mins/week

## 2021-03-03 NOTE — Progress Notes (Signed)
solu

## 2021-03-03 NOTE — Patient Instructions (Signed)
Please start using Triamcinolone cream for skin rash. You are being referred to Dermatology for further evaluation.  Please continue taking other medications as prescribed.  Please get fasting blood tests done before the next visit.

## 2021-03-28 ENCOUNTER — Other Ambulatory Visit: Payer: Self-pay | Admitting: Internal Medicine

## 2021-03-28 DIAGNOSIS — M62838 Other muscle spasm: Secondary | ICD-10-CM

## 2021-04-17 ENCOUNTER — Telehealth: Payer: Self-pay | Admitting: Internal Medicine

## 2021-04-17 NOTE — Telephone Encounter (Signed)
Tried calling patient to  schedule Medicare Annual Wellness Visit (AWV) either virtually or in office.  No answer   AWV-I PER PALMETTO 03/20/20   please schedule at anytime with [facility]  health coach  This should be a 40 minute visit.

## 2021-04-20 ENCOUNTER — Other Ambulatory Visit: Payer: Self-pay

## 2021-04-20 MED ORDER — ALBUTEROL SULFATE HFA 108 (90 BASE) MCG/ACT IN AERS: 2.0000 | INHALATION_SPRAY | RESPIRATORY_TRACT | 10 refills | Status: DC | PRN

## 2021-04-30 ENCOUNTER — Other Ambulatory Visit: Payer: Self-pay | Admitting: Internal Medicine

## 2021-04-30 DIAGNOSIS — L309 Dermatitis, unspecified: Secondary | ICD-10-CM

## 2021-05-05 ENCOUNTER — Ambulatory Visit (INDEPENDENT_AMBULATORY_CARE_PROVIDER_SITE_OTHER): Payer: Medicare Other | Admitting: Internal Medicine

## 2021-05-05 ENCOUNTER — Encounter: Payer: Self-pay | Admitting: Internal Medicine

## 2021-05-05 ENCOUNTER — Encounter: Payer: Self-pay | Admitting: *Deleted

## 2021-05-05 ENCOUNTER — Other Ambulatory Visit: Payer: Self-pay

## 2021-05-05 ENCOUNTER — Ambulatory Visit (HOSPITAL_COMMUNITY)
Admission: RE | Admit: 2021-05-05 | Discharge: 2021-05-05 | Disposition: A | Discharge status: Home / Self Care | Payer: Medicare Other | Source: Ambulatory Visit | Attending: Internal Medicine | Admitting: Internal Medicine

## 2021-05-05 DIAGNOSIS — J449 Chronic obstructive pulmonary disease, unspecified: Secondary | ICD-10-CM | POA: Insufficient documentation

## 2021-05-05 MED ORDER — PREDNISONE 10 MG PO TABS
ORAL_TABLET | ORAL | 0 refills | Status: DC
Start: 2021-05-05 — End: 2021-06-09

## 2021-05-05 NOTE — Patient Instructions (Signed)
Plan A = Automatic = Always=   Stiolto 2 puffs each am   Plan B = Backup (to supplement plan A, not to replace it) Only use your albuterol inhaler as a rescue medication to be used if you can't catch your breath by resting or doing a relaxed purse lip breathing pattern.  - The less you use it, the better it will work when you need it. - Ok to use the inhaler up to 2 puffs  every 4 hours if you must but call for appointment if use goes up over your usual need - Don't leave home without it !!  (think of it like the spare tire for your car)   Plan C = Crisis (instead of Plan B but only if Plan B stops working) - only use your albuterol nebulizer if you first try Plan B and it fails to help > ok to use the nebulizer up to every 4 hours but if start needing it regularly call for immediate appointment  Plan D = Deltasone (prednisone) - if getting worse despite ABC - 10 mg x 2 each am until better, then 1 daily x 5 days and stop  Ok to Try albuterol (first the [facility] one day and the nebulizer the next and then nothing the next time) 15 min before an activity that you know would make you short of breath and see if it makes any difference and if makes none then don't take it after activity unless you can't catch your breath.   Please remember to go to the  x-ray department  @  [facility] for your tests - we will call you with the results when they are available      Please schedule a follow up visit in 6 months but call sooner if needed

## 2021-05-05 NOTE — Progress Notes (Signed)
Letter mailed to the pt. 

## 2021-05-05 NOTE — Assessment & Plan Note (Addendum)
Quit smoking 2017  - Spirometry  01/04/17  FEV1 0.99 (23%)  Ratio 0.28 p ? Prior  = Allergy profile 04/09/20  >  Eos 0. 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 04/09/2020  MM   Level 156  - 04/09/2020 rec pred 20 until better then 10 mg daily x one week wean - 05/09/2020  After extensive coaching inhaler device,  effectiveness =    90% > rec  continue flovent 220/ stiolto and completely  taper off prednisone  - PFT's  06/24/20  FEV1 1.15 (26 % ) ratio 0.27  p 35 % improvement from saba p stiolto prior to study with DLCO  11.45 (35%) corrects to 1.88 (46%)  for alv volume and FV curve classic curvature  - 09/08/2020  After extensive coaching inhaler device,  effectiveness =    90% so try breztri  - 10/09/2020  After extensive coaching inhaler device,  effectiveness =    90% with smi > change back to stiolto and pred ceiling 20/floor 10 and added protonix 40 mg q am ac as pseudowheeze on exam - 11/06/2020 added pm doses of pepcid/ gerd recs including bed blocks   - .11/06/2020 pred floor of 10 mg / ceiling of 20 mg -  11/06/2020   Walked RA  approx   250 ft  @ avg pace  stopped due to  Sob/ fatigue with sats still 96%  Typical of a pink puffer  -  12/15/2020  After extensive coaching inhaler device,  effectiveness =    90%  -  01/28/2021   Walked RA  approx   400 ft  @ slow to moderate pace  stopped due to  Sob  With sats 94% - 05/05/2021 resumed prednisone  D  2 a day until better then 1 a day x 5 days and stop.   Group D in terms of symptom/risk and laba/lama/ICS  therefore appropriate rx at this point >>>  stiolto plus prednsione for flares  As above using the lowest effective dose as the best choice and limited from ICS due to upper airway symptoms   Re saba: I spent extra time with pt today reviewing appropriate use of albuterol for prn use on exertion with the following points: 1) saba is for relief of sob that does not improve by walking a slower pace or resting but rather if the pt does not improve after  trying this first. 2) If the pt is convinced, as many are, that saba helps recover from activity faster then it's easy to tell if this is the case by re-challenging : ie stop, take the inhaler, then p 5 minutes try the exact same activity (intensity of workload) that just caused the symptoms and see if they are substantially diminished or not after saba 3) if there is an activity that reproducibly causes the symptoms, try the saba 15 min before the activity on alternate days   If in fact the saba really does help, then fine to continue to use it prn but advised may need to look closer at the maintenance regimen being used to achieve better control of airways disease with exertion.          Each maintenance medication was reviewed in detail including emphasizing most importantly the difference between maintenance and prns and under what circumstances the prns are to be triggered using an action plan format where appropriate.  Total time for H and P, chart review, counseling, reviewing smi  device(s) and generating customized AVS unique to this office visit / same day charting = 25 min

## 2021-05-05 NOTE — Progress Notes (Addendum)
Scott Hubbard, male    DOB: 01/31/1956    MRN: [id number]   Brief patient profile:  42 yowm MM/quit smoking 04/2016 @ onset of severe spell of sob and since then doe gradually worse despite rx with flovent/ stiolto so referred to pulmonary clinic 04/09/2020 by Dr  Scott Hubbard     History of Present Illness  04/09/2020  Pulmonary/ 1st office eval/Scott Hubbard  Chief Complaint  Patient presents with  . Pulmonary Consult    Referred by Dr. Benny Hubbard for eval of COPD. Former pt of Dr Scott Hubbard. He states he gets SOB "when I do anything".    Dyspnea:  [employer] x 2 aisles but walks "faster than other people" (walked very slowly in office) so MMRC3 = can't walk 100 yards even at a slow pace at a flat grade s stopping due to sob    Last time mb and back was prior to prior to when he quit smoking as has long driveway with incline on way back  Cough: none Sleep: flat bed / one pillow L side s respiratory symptoms  SABA use: 2 pffs each am / does not know how when to use hfa vs neb     rec Plan A = Automatic = Always=    Stiolto x 2 pffs/ flovent x 2puffs  first thing in am and repeat flovent in pm  Prednisone 10 mg x 2 daily until breathing better then 1 daily x a week and stop (#60)     Take valsartan 40 mg x 2 pills daily until you see your cardiologist            Plan B = Backup (to supplement plan A, not to replace it) Only use your albuterol inhaler as a rescue medication    05/09/2020  f/u ov/Scott Hubbard re: GOLD IV/ group D symptom/risk still on pred 10 mg daily  Chief Complaint  Patient presents with  . Follow-up    Breathing is some better since the last visit. She is using her albuterol inhaler 1-2 x per day and has not needed neb.   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 Follow-up    Patient feels like his breathing is worse since last visit, shortness of breath with exertion, productive cough with white sputum, spouse states he eats salt like crazy.    Dyspnea:   Walking at [employer] x only  one aisle  = MMRC3 = can't walk 100 yards even at a slow pace at a flat grade s stopping due to sob   Cough: usual worse in am / mucoid  Sleeping: flat bed one pillow prefer L side down  SABA use: rarely  02: none  rec Plan A = Automatic = Always=    Breztri Take 2 puffs first thing in am and then another 2 puffs about 12 hours later. 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 Follow-up    Breathing is worse since starting on Breztri. He is using his albuterol inhaler 6-8 x per day.   Dyspnea:  Room to room  Cough:rattling but not productive worse in am  Sleeping: on side/ one pillow  SABA use: not using approp  02: none  rec Change valsartan to 160-12.5 one daily  Plan A = Automatic = Always=    Stiolto 2 puffs each am  (the same as taking breztri 2 every 12 hours)  Prednisone 10 mg 2 daily until better then 1 daily until return   Plan B = Backup (to supplement plan A, not to replace it) Only use your albuterol inhaler as a rescue medication  Plan C = Crisis (instead of Plan B but only if Plan B stops working) - only use  your albuterol nebulizer if you first try Plan B   Please remember to go to the  x-ray department  > done 10/14/20 Underlying emphysematous change, stable, with mild scarring on the left. No edema or airspace opacity. Stable cardiac silhouette. Emphysema (ICD10-J43.9).       11/06/2020  f/u ov/ office/Scott Hubbard re:  Copd GOLD IV / stiolto / pred 20 mg daily seemed to reduce saba dep Chief Complaint  Patient presents with  . Follow-up    productive cough with white phlegm  Dyspnea:  Room  To room  Cough: nothing unusual  For him, mostly mucoid worse in am   Sleeping: bed is flat one pillow L side down  SABA use: maybe less  02: none  rec Pepcid 20 mg (famotidine)  One after supper GERD  diet Reduce the valsartan to 80-12.5 mg one daily when you refill it . Prednisone 10 mg one each am  until return  - if worse take two each am        01/28/2021  f/u ov/ office/Scott Hubbard re: GOLD IV / stiolto x 2 / prednisone 10 mg x one  Chief Complaint  Patient presents with  . Follow-up    Intermittent Productive cough with little bit clear phlegm  Dyspnea:  Room to room  Cough: sporadic during the day  Sleeping: L side down, bed is flat  SABA use:  Has not used it on day of ov/  02: none  Covid status: vax max  Lung cancer screening: not eligible based on copd severity  rec Plan A = Automatic = Always=   Stiolto 2 puffs each am  Prednisone 10 mg x 2 daily until better then 1 daily  - always take with breakfast - if worse then double it back until better  Plan B = Backup (to supplement plan A, not to replace it) Only use your albuterol inhaler as a rescue medication Plan C = Crisis (instead of Plan B but only if Plan B stops working) - only use your albuterol nebulizer if you first try Plan B  Ok to Try albuterol (first the [facility] one day and the nebulizer  the next and then nothing the next time) 15 min before an activity that you know would make you short of breath  We will be  referring you to pulmonary rehab.      05/05/2021  f/u ov/Scott Hubbard office/Scott Hubbard re:  COPD IV  stiolto / awaiting rehab  Chief Complaint  Patient presents with  . Follow-up    Breathing is at baseline today. He is wheezing some and has occ dry cough. He is using his albuterol inhaler 12 x per wk.   Dyspnea:  Room to room  Cough:rattling but not productive/grey when it is productive,mostly in am's then clears Sleeping: flat bed/ on side one pillow  SABA use: as above  02: no  Covid status: x 3   No obvious day to day or daytime variability or assoc excess/ purulent sputum or mucus plugs or hemoptysis or cp or chest tightness, subjective wheeze or overt sinus or hb symptoms.   Sleeping as above without nocturnal  or early am exacerbation  of respiratory  c/o's or need for noct saba. Also denies any obvious fluctuation of symptoms with weather or environmental changes or other aggravating or alleviating factors except as outlined above   No unusual exposure hx or h/o childhood pna/ asthma or knowledge of premature birth.  Current Allergies, Complete Past Medical History, Past Surgical History, Family History, and Social History were reviewed in [employer] record.  ROS  The following are not active complaints unless bolded Hoarseness, sore throat, dysphagia, dental problems, itching, sneezing,  nasal congestion or discharge of excess mucus or purulent secretions, ear ache,   fever, chills, sweats, unintended wt loss or wt gain, classically pleuritic or exertional cp,  orthopnea pnd or arm/hand swelling  or leg swelling, presyncope, palpitations, abdominal pain, anorexia, nausea, vomiting, diarrhea  or change in bowel habits or change in bladder habits, change in stools or change in urine, dysuria, hematuria,  rash, arthralgias, visual complaints, headache, numbness, weakness or ataxia or problems with walking or coordination,  change in mood or  memory.        Current  Meds  Medication Sig  . albuterol (PROVENTIL) (2.5 MG/3ML) 0.083% nebulizer solution Take 3 mLs (2.5 mg total) by nebulization every 4 (four) hours as needed for wheezing or shortness of breath.  Marland Kitchen albuterol (VENTOLIN HFA) 108 (90 Base) MCG/ACT inhaler Inhale 2 puffs into the lungs every 4 (four) hours as needed for wheezing or shortness of breath.  . cyclobenzaprine (FLEXERIL) 10 MG tablet TAKE 1 TABLET BY MOUTH TWICE A DAY AS NEEDED FOR MUSCLE SPASM  . famotidine (PEPCID) 20 MG tablet One after supper  . pantoprazole (PROTONIX) 40 MG tablet TAKE 1 TABLET (40 MG TOTAL) BY MOUTH DAILY. TAKE 30-60 MIN BEFORE FIRST MEAL OF THE DAY  . predniSONE (DELTASONE) 10 MG tablet 2 daily until better then  1 daily x 5 days  And stop  . Tiotropium Bromide-Olodaterol (STIOLTO RESPIMAT) 2.5-2.5 MCG/ACT AERS Inhale 2 puffs into the lungs daily.  Marland Kitchen triamcinolone cream (KENALOG) 0.1 % APPLY TO AFFECTED AREA TWICE A DAY  . valsartan-hydrochlorothiazide (DIOVAN HCT) 80-12.5 MG tablet Take 1 tablet by mouth daily.  Past Medical History:  Diagnosis Date  . Chest discomfort    negative stress echocardiogram-01/2011  . Chronic bronchitis   . COPD (chronic obstructive pulmonary disease) ([place])   . DJD (degenerative joint disease)   . Dyspnea   . Gastro-esophageal reflux disease with esophagitis   . GERD (gastroesophageal reflux disease)   . Hyperlipidemia   . Hypertension   . Migraine   . PVC (premature ventricular contraction)    Frequent  . Tobacco abuse   . Tobacco abuse    40 pack years       Objective:   05/05/2021       192  01/28/2021         203 11/06/2020      200  09/08/2020       195   05/09/20 197 lb (89.4 kg)  04/30/20 197 lb (89.4 kg)  04/09/20 197 lb (89.4 kg)    Vital signs reviewed  05/05/2021  - Note at rest 02 sats  96% on RA   General appearance:    Chronically ill amb wm nad      Reports:  Edentulous  HEENT : pt wearing mask not removed for exam due to covid -19 concerns.    NECK :  without JVD/Nodes/TM/ nl carotid upstrokes bilaterally   LUNGS: no acc muscle use,  Mod barrel  contour chest wall with bilateral  Distant pan exp rhonchi some better with plm and  without cough on insp or exp maneuvers and mod  Hyperresonant  to  percussion bilaterally     CV:  RRR  no s3 or murmur or increase in P2, and no edema   ABD:  soft and nontender with pos mid insp Hoover's  in the supine position. No bruits or organomegaly appreciated, bowel sounds nl  MS:     ext warm without deformities, calf tenderness, cyanosis or clubbing No obvious joint restrictions   SKIN: warm and dry without lesions    NEURO:  alert, approp, nl sensorium with  no motor or cerebellar deficits apparent.       CXR PA and Lateral:   05/05/2021 :    I personally reviewed images and agree with radiology impression as follows:   Emphysema and chronic lung changes without definite evidence of acute cardiopulmonary disease               Assessment

## 2021-06-02 ENCOUNTER — Other Ambulatory Visit: Payer: Self-pay | Admitting: Internal Medicine

## 2021-06-02 DIAGNOSIS — L309 Dermatitis, unspecified: Secondary | ICD-10-CM

## 2021-06-09 ENCOUNTER — Other Ambulatory Visit: Payer: Self-pay | Admitting: Internal Medicine

## 2021-06-26 LAB — CBC
Hematocrit: 40.3 % (ref 37.5–51.0)
Hemoglobin: 13 g/dL (ref 13.0–17.7)
MCH: 28.4 pg (ref 26.6–33.0)
MCHC: 32.3 g/dL (ref 31.5–35.7)
MCV: 88 fL (ref 79–97)
Platelets: 274 10*3/uL (ref 150–450)
RBC: 4.58 x10E6/uL (ref 4.14–5.80)
RDW: 13.8 % (ref 11.6–15.4)
WBC: 12.9 10*3/uL — ABNORMAL HIGH (ref 3.4–10.8)

## 2021-06-26 LAB — CMP14+EGFR
ALT: 6 IU/L (ref 0–44)
AST: 12 IU/L (ref 0–40)
Albumin/Globulin Ratio: 1.8 (ref 1.2–2.2)
Albumin: 4.2 g/dL (ref 3.8–4.8)
Alkaline Phosphatase: 59 IU/L (ref 44–121)
BUN/Creatinine Ratio: 17 (ref 10–24)
BUN: 40 mg/dL — ABNORMAL HIGH (ref 8–27)
Bilirubin Total: 0.3 mg/dL (ref 0.0–1.2)
CO2: 26 mmol/L (ref 20–29)
Calcium: 9.2 mg/dL (ref 8.6–10.2)
Chloride: 98 mmol/L (ref 96–106)
Creatinine, Ser: 2.37 mg/dL — ABNORMAL HIGH (ref 0.76–1.27)
Globulin, Total: 2.3 g/dL (ref 1.5–4.5)
Glucose: 116 mg/dL — ABNORMAL HIGH (ref 65–99)
Potassium: 5.1 mmol/L (ref 3.5–5.2)
Sodium: 138 mmol/L (ref 134–144)
Total Protein: 6.5 g/dL (ref 6.0–8.5)
eGFR: 29 mL/min/{1.73_m2} — ABNORMAL LOW (ref >59)

## 2021-06-26 LAB — LIPID PANEL
Chol/HDL Ratio: 4.8 ratio (ref 0.0–5.0)
Cholesterol, Total: 220 mg/dL — ABNORMAL HIGH (ref 100–199)
HDL: 46 mg/dL (ref >39)
LDL Chol Calc (NIH): 139 mg/dL — ABNORMAL HIGH (ref 0–99)
Triglycerides: 196 mg/dL — ABNORMAL HIGH (ref 0–149)
VLDL Cholesterol Cal: 35 mg/dL (ref 5–40)

## 2021-06-26 LAB — TSH: TSH: 2.06 u[IU]/mL (ref 0.450–4.500)

## 2021-06-26 LAB — HEMOGLOBIN A1C
Est. average glucose Bld gHb Est-mCnc: 131 mg/dL
Hgb A1c MFr Bld: 6.2 % — ABNORMAL HIGH (ref 4.8–5.6)

## 2021-07-02 ENCOUNTER — Ambulatory Visit: Payer: Medicare Other | Admitting: Internal Medicine

## 2021-07-09 ENCOUNTER — Other Ambulatory Visit: Payer: Self-pay | Admitting: Internal Medicine

## 2021-07-09 DIAGNOSIS — M62838 Other muscle spasm: Secondary | ICD-10-CM

## 2021-07-09 DIAGNOSIS — L309 Dermatitis, unspecified: Secondary | ICD-10-CM

## 2021-07-15 ENCOUNTER — Other Ambulatory Visit: Payer: Self-pay | Admitting: Internal Medicine

## 2021-07-15 DIAGNOSIS — N179 Acute kidney failure, unspecified: Secondary | ICD-10-CM

## 2021-07-30 ENCOUNTER — Ambulatory Visit (INDEPENDENT_AMBULATORY_CARE_PROVIDER_SITE_OTHER): Payer: Medicare Other | Admitting: Internal Medicine

## 2021-07-30 ENCOUNTER — Other Ambulatory Visit: Payer: Self-pay

## 2021-07-30 ENCOUNTER — Encounter: Payer: Self-pay | Admitting: Internal Medicine

## 2021-07-30 DIAGNOSIS — Z20822 Contact with and (suspected) exposure to covid-19: Secondary | ICD-10-CM | POA: Diagnosis not present

## 2021-07-30 NOTE — Progress Notes (Signed)
Virtual Visit via Telephone Note   This visit type was conducted due to national recommendations for restrictions regarding the COVID-19 Pandemic (e.g. social distancing) in an effort to limit this patient's exposure and mitigate transmission in our community.  Due to his co-morbid illnesses, this patient is at least at moderate risk for complications without adequate follow up.  This format is felt to be most appropriate for this patient at this time.  The patient did not have access to video technology/had technical difficulties with video requiring transitioning to audio format only (telephone).  All issues noted in this document were discussed and addressed.  No physical exam could be performed with this format.  Evaluation Performed:  Follow-up visit  Date:  07/30/2021   ID:  Scott, Hubbard [birth year], MRN [id number]  Patient Location: Home Provider Location: Office/Clinic  Participants: Patient Location of Patient: Home Location of Provider: Telehealth Consent was obtain for visit to be over via telehealth. I verified that I am speaking with the correct person using two identifiers.  PCP:  Lindell Spar, MD   Chief Complaint: Cough, dyspnea, loss of taste and diarrhea  History of Present Illness:    Scott Hubbard is a 65 y.o. male who has a televisit for c/o Cough, dyspnea, loss of taste and diarrhea. His diarrhea has resolved now. He has chronic dyspnea due to COPD and takes Prednisone for it. He also uses Stiolto and Albuterol for it. Denies any sicj contacts recently. He has had 2 doses of COVID vaccine.  The patient does have symptoms concerning for COVID-19 infection (fever, chills, cough, or new shortness of breath).   Past Medical, Surgical, Social History, Allergies, and Medications have been Reviewed.  Past Medical History:  Diagnosis Date   Chest discomfort    negative stress echocardiogram-01/2011   Chronic bronchitis    COPD (chronic obstructive  pulmonary disease) ([facility])    DJD (degenerative joint disease)    Gastro-esophageal reflux disease with esophagitis    GERD (gastroesophageal reflux disease)    Hyperlipidemia    Hypertension    Migraine    PVC (premature ventricular contraction)    Frequent   Tobacco abuse    40 pack years   Past Surgical History:  Procedure Laterality Date   None       Current Meds  Medication Sig   albuterol (PROVENTIL) (2.5 MG/3ML) 0.083% nebulizer solution Take 3 mLs (2.5 mg total) by nebulization every 4 (four) hours as needed for wheezing or shortness of breath.   albuterol (VENTOLIN HFA) 108 (90 Base) MCG/ACT inhaler Inhale 2 puffs into the lungs every 4 (four) hours as needed for wheezing or shortness of breath.   cyclobenzaprine (FLEXERIL) 10 MG tablet TAKE 1 TABLET BY MOUTH TWICE A DAY AS NEEDED FOR MUSCLE SPASMS   famotidine (PEPCID) 20 MG tablet One after supper   pantoprazole (PROTONIX) 40 MG tablet TAKE 1 TABLET (40 MG TOTAL) BY MOUTH DAILY. TAKE 30-60 MIN BEFORE FIRST MEAL OF THE DAY   predniSONE (DELTASONE) 10 MG tablet TAKE 2 DAILY UNTIL BETTER THEN 1 DAILY X 5 DAYS AND STOP   Tiotropium Bromide-Olodaterol (STIOLTO RESPIMAT) 2.5-2.5 MCG/ACT AERS Inhale 2 puffs into the lungs daily.   triamcinolone cream (KENALOG) 0.1 % APPLY TO AFFECTED AREA TWICE A DAY   valsartan-hydrochlorothiazide (DIOVAN HCT) 80-12.5 MG tablet Take 1 tablet by mouth daily.     Allergies:   Patient has no known allergies.   ROS:   Please  see the history of present illness.     All other systems reviewed and are negative.   Labs/Other Tests and Data Reviewed:    Recent Labs: 06/25/2021: ALT 6; BUN 40; Creatinine, Ser 2.37; Hemoglobin 13.0; Platelets 274; Potassium 5.1; Sodium 138; TSH 2.060   Recent Lipid Panel Lab Results  Component Value Date/Time   CHOL 220 (H) 06/25/2021 01:13 PM   TRIG 196 (H) 06/25/2021 01:13 PM   HDL 46 06/25/2021 01:13 PM   CHOLHDL 4.8 06/25/2021 01:13 PM   LDLCALC 139 (H)  06/25/2021 01:13 PM    Wt Readings from Last 3 Encounters:  05/05/21 192 lb 6.4 oz (87.3 kg)  03/03/21 201 lb 6.4 oz (91.4 kg)  01/28/21 203 lb 9.6 oz (92.4 kg)      ASSESSMENT & PLAN:    Suspected COVID-19 infection Check COVID RT-PCR Continue inhalers for COPD Advised to take Prednisone 20 mg QD for now until dyspnea improves Nasal saline spray PRN Advised to maintain adequate hydration  Time:   Today, I have spent 8 minutes reviewing the chart, including problem list, medications, and with the patient with telehealth technology discussing the above problems.   Medication Adjustments/Labs and Tests Ordered: Current medicines are reviewed at length with the patient today.  Concerns regarding medicines are outlined above.   Tests Ordered: Orders Placed This Encounter  Procedures   Novel Coronavirus, NAA ([facility])    Medication Changes: No orders of the defined types were placed in this encounter.    Note: This dictation was prepared with Dragon dictation along with smaller phrase technology. Similar sounding words can be transcribed inadequately or may not be corrected upon review. Any transcriptional errors that result from this process are unintentional.      Disposition:  Follow up  Signed, Lindell Spar, MD  07/30/2021 2:43 PM     [place]

## 2021-07-31 LAB — BASIC METABOLIC PANEL
BUN/Creatinine Ratio: 12 (ref 10–24)
BUN: 54 mg/dL — ABNORMAL HIGH (ref 8–27)
CO2: 20 mmol/L (ref 20–29)
Calcium: 9.8 mg/dL (ref 8.6–10.2)
Chloride: 98 mmol/L (ref 96–106)
Creatinine, Ser: 4.68 mg/dL — ABNORMAL HIGH (ref 0.76–1.27)
Glucose: 95 mg/dL (ref 65–99)
Potassium: 5.3 mmol/L — ABNORMAL HIGH (ref 3.5–5.2)
Sodium: 135 mmol/L (ref 134–144)
eGFR: 13 mL/min/{1.73_m2} — ABNORMAL LOW (ref >59)

## 2021-08-01 LAB — SARS-COV-2, NAA 2 DAY TAT

## 2021-08-01 LAB — NOVEL CORONAVIRUS, NAA: SARS-CoV-2, NAA: NOT DETECTED

## 2021-08-03 ENCOUNTER — Other Ambulatory Visit: Payer: Self-pay

## 2021-08-03 ENCOUNTER — Emergency Department (HOSPITAL_COMMUNITY)
Admission: EM | Admit: 2021-08-03 | Discharge: 2021-08-03 | Discharge status: Against Medical Advice | Payer: Medicare Other | Attending: Emergency Medicine | Admitting: Emergency Medicine

## 2021-08-03 ENCOUNTER — Emergency Department (HOSPITAL_COMMUNITY): Payer: Medicare Other

## 2021-08-03 DIAGNOSIS — Z20822 Contact with and (suspected) exposure to covid-19: Secondary | ICD-10-CM | POA: Insufficient documentation

## 2021-08-03 DIAGNOSIS — R799 Abnormal finding of blood chemistry, unspecified: Secondary | ICD-10-CM | POA: Diagnosis present

## 2021-08-03 DIAGNOSIS — Z79899 Other long term (current) drug therapy: Secondary | ICD-10-CM | POA: Insufficient documentation

## 2021-08-03 DIAGNOSIS — N179 Acute kidney failure, unspecified: Secondary | ICD-10-CM | POA: Diagnosis not present

## 2021-08-03 DIAGNOSIS — Z7951 Long term (current) use of inhaled steroids: Secondary | ICD-10-CM | POA: Diagnosis not present

## 2021-08-03 DIAGNOSIS — I10 Essential (primary) hypertension: Secondary | ICD-10-CM | POA: Diagnosis not present

## 2021-08-03 DIAGNOSIS — J449 Chronic obstructive pulmonary disease, unspecified: Secondary | ICD-10-CM | POA: Diagnosis not present

## 2021-08-03 DIAGNOSIS — Z87891 Personal history of nicotine dependence: Secondary | ICD-10-CM | POA: Insufficient documentation

## 2021-08-03 LAB — CBC WITH DIFFERENTIAL/PLATELET
Abs Immature Granulocytes: 0.08 10*3/uL — ABNORMAL HIGH (ref 0.00–0.07)
Basophils Absolute: 0 10*3/uL (ref 0.0–0.1)
Basophils Relative: 0 %
Eosinophils Absolute: 0.1 10*3/uL (ref 0.0–0.5)
Eosinophils Relative: 0 %
HCT: 38 % — ABNORMAL LOW (ref 39.0–52.0)
Hemoglobin: 11.9 g/dL — ABNORMAL LOW (ref 13.0–17.0)
Immature Granulocytes: 1 %
Lymphocytes Relative: 14 %
Lymphs Abs: 1.9 10*3/uL (ref 0.7–4.0)
MCH: 29.5 pg (ref 26.0–34.0)
MCHC: 31.3 g/dL (ref 30.0–36.0)
MCV: 94.1 fL (ref 80.0–100.0)
Monocytes Absolute: 0.6 10*3/uL (ref 0.1–1.0)
Monocytes Relative: 5 %
Neutro Abs: 10.6 10*3/uL — ABNORMAL HIGH (ref 1.7–7.7)
Neutrophils Relative %: 80 %
Platelets: 258 10*3/uL (ref 150–400)
RBC: 4.04 MIL/uL — ABNORMAL LOW (ref 4.22–5.81)
RDW: 14.6 % (ref 11.5–15.5)
WBC: 13.2 10*3/uL — ABNORMAL HIGH (ref 4.0–10.5)
nRBC: 0 % (ref 0.0–0.2)

## 2021-08-03 LAB — RESP PANEL BY RT-PCR (FLU A&B, COVID) ARPGX2
Influenza A by PCR: NEGATIVE
Influenza B by PCR: NEGATIVE
SARS Coronavirus 2 by RT PCR: NEGATIVE

## 2021-08-03 LAB — COMPREHENSIVE METABOLIC PANEL
ALT: 12 U/L (ref 0–44)
AST: 15 U/L (ref 15–41)
Albumin: 4 g/dL (ref 3.5–5.0)
Alkaline Phosphatase: 54 U/L (ref 38–126)
Anion gap: 10 (ref 5–15)
BUN: 55 mg/dL — ABNORMAL HIGH (ref 8–23)
CO2: 19 mmol/L — ABNORMAL LOW (ref 22–32)
Calcium: 8.9 mg/dL (ref 8.9–10.3)
Chloride: 106 mmol/L (ref 98–111)
Creatinine, Ser: 3.67 mg/dL — ABNORMAL HIGH (ref 0.61–1.24)
GFR, Estimated: 17 mL/min — ABNORMAL LOW (ref >60)
Glucose, Bld: 102 mg/dL — ABNORMAL HIGH (ref 70–99)
Potassium: 5 mmol/L (ref 3.5–5.1)
Sodium: 135 mmol/L (ref 135–145)
Total Bilirubin: 0.7 mg/dL (ref 0.3–1.2)
Total Protein: 7.2 g/dL (ref 6.5–8.1)

## 2021-08-03 MED ORDER — SODIUM CHLORIDE 0.9 % IV BOLUS
1000.0000 mL | Freq: Once | INTRAVENOUS | Status: AC
  Administered 2021-08-03: 1000 mL via INTRAVENOUS

## 2021-08-03 NOTE — ED Triage Notes (Signed)
Pt sent over by dr.patel for abnormal lab. Note states rule out acute kidney injury. Pt creatinine 4.68.

## 2021-08-03 NOTE — ED Provider Notes (Signed)
[facility] Provider Note   CSN: [id number] Arrival date & time: 08/03/21  4097     History Chief Complaint  Patient presents with   Abnormal Lab    Scott Hubbard is a 65 y.o. male with past medical history significant for hypertension, hyperlipidemia, tobacco use, COPD who presents for evaluation of abnormal labs.  States he was seen by his primary care doctor to have lab work done.  Noted to have acute renal failure.  Patient state last week he had 3 days of watery diarrhea.  States he has had "normal" bowel movement since.  No melena or bright blood per rectum.  No abdominal pain, suspicious food intake or antibiotics.  Did a telehealth visit 4 days ago for cough, loss of taste and smell.  He states he has chronic cough and dyspnea due to COPD.  He continues to use tobacco.  Denies any recent sick contacts.  He is COVID vaccinated.  COVID neg at PCP. He has been using his inhalers at home.  He states he was placed on steroids for his shortness of breath which is significantly helped.  He denies fever, chills, nausea, vomiting, chest pain, back pain, abdominal pain, lower extremity pain, weakness, numbness.  Denies additional aggravating or alleviating factors.  No decreased p.o. intake.  He denies any current complaints.  States he is "just here because my doctor told me to come."  Creatinine 4.6 at PCP up from 2.37 1 month ago  History obtained from patient, significant other and past medical records.  No interpreter used.  HPI     Past Medical History:  Diagnosis Date   Chest discomfort    negative stress echocardiogram-01/2011   Chronic bronchitis    COPD (chronic obstructive pulmonary disease) (HCC)    DJD (degenerative joint disease)    Gastro-esophageal reflux disease with esophagitis    GERD (gastroesophageal reflux disease)    Hyperlipidemia    Hypertension    Migraine    PVC (premature ventricular contraction)    Frequent   Tobacco abuse    40  pack years    Patient Active Problem List   Diagnosis Date Noted   Eczema 35/32/9924   Umbilical hernia without obstruction and without gangrene 11/24/2020   Lipoma of right axilla 11/24/2020   Constipation 11/24/2020   Muscle spasms of both lower extremities 10/12/2020   Prediabetes 10/12/2020   GERD (gastroesophageal reflux disease)    COPD GOLD IV/ Group D  08/28/2019   Screening for colon cancer 08/28/2019   Urinary symptom or sign 08/28/2019   Laceration of hand 10/12/2018   Pain of left hand 10/12/2018   DJD (degenerative joint disease)    Essential hypertension    PVC (premature ventricular contraction)    HYPERLIPIDEMIA 01/29/2011   MIGRAINE HEADACHE 01/29/2011   Chronic bronchitis ([place]) 01/29/2011   DOE (dyspnea on exertion) 01/29/2011   CHEST PAIN 01/29/2011    Past Surgical History:  Procedure Laterality Date   None         No family history on file.  Social History   Tobacco Use   Smoking status: Former    Packs/day: 1.50    Years: 40.00    Pack years: 60.00    Types: Cigarettes    Quit date: 04/27/2016    Years since quitting: 5.2   Smokeless tobacco: Never  Vaping Use   Vaping Use: Never used  Substance Use Topics   Alcohol use: No   Drug use: No  Home Medications Prior to Admission medications   Medication Sig Start Date End Date Taking? Authorizing Provider  albuterol (PROVENTIL) (2.5 MG/3ML) 0.083% nebulizer solution Take 3 mLs (2.5 mg total) by nebulization every 4 (four) hours as needed for wheezing or shortness of breath. 10/09/20   Tanda Rockers, MD  albuterol (VENTOLIN HFA) 108 (90 Base) MCG/ACT inhaler Inhale 2 puffs into the lungs every 4 (four) hours as needed for wheezing or shortness of breath. 04/20/21   Tanda Rockers, MD  cyclobenzaprine (FLEXERIL) 10 MG tablet TAKE 1 TABLET BY MOUTH TWICE A DAY AS NEEDED FOR MUSCLE SPASMS 07/09/21   Lindell Spar, MD  famotidine (PEPCID) 20 MG tablet One after supper 11/06/20   Tanda Rockers, MD  pantoprazole (PROTONIX) 40 MG tablet TAKE 1 TABLET (40 MG TOTAL) BY MOUTH DAILY. TAKE 30-60 MIN BEFORE FIRST MEAL OF THE DAY 01/19/21   Tanda Rockers, MD  predniSONE (DELTASONE) 10 MG tablet TAKE 2 DAILY UNTIL BETTER THEN 1 DAILY X 5 DAYS AND STOP 06/09/21   Tanda Rockers, MD  Tiotropium Bromide-Olodaterol (STIOLTO RESPIMAT) 2.5-2.5 MCG/ACT AERS Inhale 2 puffs into the lungs daily. 10/09/20   Tanda Rockers, MD  triamcinolone cream (KENALOG) 0.1 % APPLY TO AFFECTED AREA TWICE A DAY 07/09/21   Lindell Spar, MD  valsartan-hydrochlorothiazide (DIOVAN HCT) 80-12.5 MG tablet Take 1 tablet by mouth daily. 11/06/20   Tanda Rockers, MD    Allergies    Patient has no known allergies.  Review of Systems   Review of Systems  Constitutional: Negative.   HENT: Negative.    Respiratory:  Positive for cough (Chronic) and shortness of breath (Resolved).   Cardiovascular: Negative.   Gastrointestinal:  Positive for diarrhea (Resolved). Negative for abdominal distention, abdominal pain, anal bleeding, blood in stool, nausea, rectal pain and vomiting.  Genitourinary: Negative.   Musculoskeletal: Negative.   Skin: Negative.   Neurological: Negative.   All other systems reviewed and are negative.  Physical Exam Updated Vital Signs BP 134/75 (BP Location: Right Arm)   Pulse 63   Temp 98.5 F (36.9 C) (Oral)   Resp 18   Ht 6\' 5"  (1.956 m)   Wt 88.5 kg   SpO2 93%   BMI 23.12 kg/m   Physical Exam Vitals and nursing note reviewed.  Constitutional:      General: He is not in acute distress.    Appearance: He is well-developed. He is not ill-appearing, toxic-appearing or diaphoretic.  HENT:     Head: Normocephalic and atraumatic.     Nose: Nose normal.     Mouth/Throat:     Mouth: Mucous membranes are moist.  Eyes:     Pupils: Pupils are equal, round, and reactive to light.  Cardiovascular:     Rate and Rhythm: Normal rate and regular rhythm.     Pulses: Normal pulses.     Heart  sounds: Normal heart sounds.  Pulmonary:     Effort: Pulmonary effort is normal. No respiratory distress.     Breath sounds: Normal breath sounds.     Comments: Very minimal expiratory wheeze, speaks in full sentences without difficulty Abdominal:     General: There is no distension.     Palpations: Abdomen is soft.     Tenderness: There is no abdominal tenderness. There is no right CVA tenderness, left CVA tenderness or guarding.     Comments: Soft, nontender.  Negative CVA tap bilaterally.  Musculoskeletal:  General: Normal range of motion.     Cervical back: Normal range of motion and neck supple.     Comments: No bony tenderness.  Moves all 4 extremities without difficulty  Skin:    General: Skin is warm and dry.     Capillary Refill: Capillary refill takes less than 2 seconds.     Comments: No rashes or lesions  Neurological:     General: No focal deficit present.     Mental Status: He is alert and oriented to person, place, and time.    ED Results / Procedures / Treatments   Labs (all labs ordered are listed, but only abnormal results are displayed) Labs Reviewed  CBC WITH DIFFERENTIAL/PLATELET - Abnormal; Notable for the following components:      Result Value   WBC 13.2 (*)    RBC 4.04 (*)    Hemoglobin 11.9 (*)    HCT 38.0 (*)    Neutro Abs 10.6 (*)    Abs Immature Granulocytes 0.08 (*)    All other components within normal limits  COMPREHENSIVE METABOLIC PANEL - Abnormal; Notable for the following components:   CO2 19 (*)    Glucose, Bld 102 (*)    BUN 55 (*)    Creatinine, Ser 3.67 (*)    GFR, Estimated 17 (*)    All other components within normal limits  RESP PANEL BY RT-PCR (FLU A&B, COVID) ARPGX2  URINALYSIS, ROUTINE W REFLEX MICROSCOPIC    EKG None  Radiology US Renal  Result Date: 08/03/2021 CLINICAL DATA:  AKI EXAM: RENAL / URINARY TRACT ULTRASOUND COMPLETE COMPARISON:  None. FINDINGS: Right Kidney: Renal measurements: 10.6 x 5.8 x 4.8 cm =  volume: 154.1 mL. Cortical thinning. Echogenicity within mildly increased. Interpolar region 9 mm calculus. Small cyst. No hydronephrosis visualized. Left Kidney: Renal measurements: 10.3 x 5 x 4.5 cm = volume: 121 mL. Cortical thinning. Echogenicity within mildly increased. No mass or hydronephrosis visualized. Bladder: Appears normal for degree of bladder distention. Other: None. IMPRESSION: Mildly increased renal echogenicity and cortical thinning suggesting renal disease. No hydronephrosis. Subcentimeter nonobstructing right renal calculus. Electronically Signed   By: Macy Mis M.D.   On: 08/03/2021 16:15    Procedures Procedures   Medications Ordered in ED Medications  sodium chloride 0.9 % bolus 1,000 mL (1,000 mLs Intravenous New Bag/Given 08/03/21 1552)    ED Course  I have reviewed the triage vital signs and the nursing notes.  Pertinent labs & imaging results that were available during my care of the patient were reviewed by me and considered in my medical decision making (see chart for details).  Here for evaluation of abnormal lab, sent over from PCP.  Afebrile, nonseptic, not ill-appearing.  Apparently had outpatient labs which showed a creatinine of 4.68.  Patient states last week he did have 3 days of multiple episodes of diarrhea daily.  This is since resolved.  He has had no melena, bright red per rectum.  No associate abdominal pain.  He is tolerating p.o. intake.  Heart clear.  Lungs with some minimal expiratory wheeze, has known COPD has been on steroids which she states significantly helped with the shortness of breath.  Had COVID test outpatient which was negative.  We will plan on labs, imaging, reassess  Labs and imaging personally reviewed and interpreted  CBC with leukocytosis at 13.2, similar to prior Metabolic panel with creatinine 3.67, GFR 17, normal potassium Ultrasound with mildly increased renal echogenicity and cortical thinning suggesting renal  disease  Nursing has informed me that patient is requesting to leave the emergency department  I went to reassess patient.  He has gotten 1 L IV fluids.  I discussed his labs and work-up thus far.  Still pending a UA.  Patient states he is "ready to go."  He does not want any further work-up.  I discussed with patient pending UA to ensure this is not infectious in nature.  Patient is leaving [place].  I discussed with him adequate hydration, question if his acute renal failure is due to his recent diarrheal illness which has since resolved?  Discussed close follow-up with PCP in 24 hours, return here to emergency department for new or worsening complaints.  We discussed the nature and purpose, risks and benefits, as well as, the alternatives of treatment. Time was given to allow the opportunity to ask questions and consider their options, and after the discussion, the patient decided to refuse the offerred treatment. The patient was informed that refusal could lead to, but was not limited to, death, permanent disability, or severe pain. If present, I asked the relatives or significant others to dissuade them without success. Prior to refusing, I determined that the patient had the capacity to make their decision and understood the consequences of that decision. After refusal, I made every reasonable opportunity to treat them to the best of my ability.  The patient was notified that they may return to the emergency department at any time for further treatment.        MDM Rules/Calculators/A&P                            Final Clinical Impression(s) / ED Diagnoses Final diagnoses:  Acute renal failure, unspecified acute renal failure type [facility])    Rx / DC Orders ED Discharge Orders     None        Julez Huseby A, PA-C 08/03/21 1726    Milton Ferguson, MD 08/05/21 [phone]

## 2021-08-03 NOTE — Discharge Instructions (Addendum)
You did not want to be admitted to the hospital today.  Sure to drink plenty of fluids at home.  Follow up with primary care doctor in 24 to 48 hours for recheck of your labs  Return for new or worsening symptoms

## 2021-08-06 ENCOUNTER — Other Ambulatory Visit: Payer: Self-pay

## 2021-08-06 ENCOUNTER — Encounter: Payer: Self-pay | Admitting: Internal Medicine

## 2021-08-06 ENCOUNTER — Ambulatory Visit (INDEPENDENT_AMBULATORY_CARE_PROVIDER_SITE_OTHER): Payer: Medicare Other | Admitting: Internal Medicine

## 2021-08-06 VITALS — BP 111/71 | HR 66 | Temp 98.2°F | Resp 18 | Ht 77.0 in | Wt 187.0 lb

## 2021-08-06 DIAGNOSIS — J449 Chronic obstructive pulmonary disease, unspecified: Secondary | ICD-10-CM

## 2021-08-06 DIAGNOSIS — N184 Chronic kidney disease, stage 4 (severe): Secondary | ICD-10-CM | POA: Diagnosis not present

## 2021-08-06 DIAGNOSIS — I10 Essential (primary) hypertension: Secondary | ICD-10-CM | POA: Diagnosis not present

## 2021-08-06 DIAGNOSIS — Z09 Encounter for follow-up examination after completed treatment for conditions other than malignant neoplasm: Secondary | ICD-10-CM

## 2021-08-06 NOTE — Assessment & Plan Note (Addendum)
Quit smoking in 2017 On Stiolto On oral Prednisone 10 mg QD Albuterol nebs and inhaler PRN Follows up with Dr Melvyn Novas

## 2021-08-06 NOTE — Assessment & Plan Note (Signed)
BP Readings from Last 1 Encounters:  08/06/21 111/71   Well-controlled with Valsartan-HCTZ Counseled for compliance with the medications Advised DASH diet and moderate exercise/walking, at least 150 mins/week

## 2021-08-06 NOTE — Progress Notes (Signed)
Established Patient Office Visit  Subjective:  Patient ID: Scott Hubbard, male    DOB: August 15, 1956  Age: 65 y.o. MRN: [id number]  CC:  Chief Complaint  Patient presents with   COPD   Hypertension    HPI Scott Hubbard is a 65 year old male with past medical history of COPD, uncontrolled hypertension and previous tobacco abuse who presents after a recent ER visit for AKI.  He was referred to ER for evaluation of AKI on CKD. He was given IV fluids, but he left AMA before getting admitted. He denies any urinary hesitancy, resistance or oligouria. Denies any dysuria or hematuria. He has cut down on soda intake. His diarrhea has resolved now.  COPD: Breathing is improved. Denies overt dyspnea or wheezing. On Prednisone in addition to inhalers. Followed by Dr Melvyn Novas.  HTN: BP is well-controlled. Takes medications regularly. Patient denies headache, dizziness, chest pain, dyspnea or palpitations.   Past Medical History:  Diagnosis Date   Chest discomfort    negative stress echocardiogram-01/2011   Chronic bronchitis    COPD (chronic obstructive pulmonary disease) (HCC)    DJD (degenerative joint disease)    Gastro-esophageal reflux disease with esophagitis    GERD (gastroesophageal reflux disease)    Hyperlipidemia    Hypertension    Migraine    PVC (premature ventricular contraction)    Frequent   Tobacco abuse    40 pack years    Past Surgical History:  Procedure Laterality Date   None      History reviewed. No pertinent family history.  Social History   Socioeconomic History   Marital status: Legally Separated    Spouse name: Not on file   Number of children: 2   Years of education: Not on file   Highest education level: Not on file  Occupational History   Occupation: unemployed    Employer: UNEMPLOYED  Tobacco Use   Smoking status: Former    Packs/day: 1.50    Years: 40.00    Pack years: 60.00    Types: Cigarettes    Quit date: 04/27/2016    Years since  quitting: 5.2   Smokeless tobacco: Never  Vaping Use   Vaping Use: Never used  Substance and Sexual Activity   Alcohol use: No   Drug use: No   Sexual activity: Not on file  Other Topics Concern   Not on file  Social History Narrative   Lives with girlfriend.  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     Social Determinants of Health   Financial Resource Strain: Not on file  Food Insecurity: Not on file  Transportation Needs: Not on file  Physical Activity: Not on file  Stress: Not on file  Social Connections: Not on file  Intimate Partner Violence: Not on file    Outpatient Medications Prior to Visit  Medication Sig Dispense Refill   albuterol (PROVENTIL) (2.5 MG/3ML) 0.083% nebulizer solution Take 3 mLs (2.5 mg total) by nebulization every 4 (four) hours as needed for wheezing or shortness of breath. 75 mL 12   albuterol (VENTOLIN HFA) 108 (90 Base) MCG/ACT inhaler Inhale 2 puffs into the lungs every 4 (four) hours as needed for wheezing or shortness of breath. 8.5 g 10   cyclobenzaprine (FLEXERIL) 10 MG tablet TAKE 1 TABLET BY MOUTH TWICE A DAY AS NEEDED FOR MUSCLE SPASMS 60 tablet 2   famotidine (PEPCID) 20 MG tablet One after supper 30 tablet 11   pantoprazole (PROTONIX) 40 MG tablet TAKE 1 TABLET (  40 MG TOTAL) BY MOUTH DAILY. TAKE 30-60 MIN BEFORE FIRST MEAL OF THE DAY 90 tablet 3   predniSONE (DELTASONE) 10 MG tablet TAKE 2 DAILY UNTIL BETTER THEN 1 DAILY X 5 DAYS AND STOP 100 tablet 1   Tiotropium Bromide-Olodaterol (STIOLTO RESPIMAT) 2.5-2.5 MCG/ACT AERS Inhale 2 puffs into the lungs daily. 1 each 11   triamcinolone cream (KENALOG) 0.1 % APPLY TO AFFECTED AREA TWICE A DAY 30 g 0   valsartan-hydrochlorothiazide (DIOVAN HCT) 80-12.5 MG tablet Take 1 tablet by mouth daily. 30 tablet 11   No facility-administered medications prior to visit.    No Known Allergies  ROS Review of Systems  Constitutional:  Negative for chills and fever.  HENT:  Negative for congestion, sinus  pressure, sinus pain and sore throat.   Eyes:  Negative for pain and discharge.  Respiratory:  Positive for shortness of breath (chronic) and wheezing. Negative for cough.   Cardiovascular:  Negative for chest pain and palpitations.  Gastrointestinal:  Negative for constipation, diarrhea, nausea and vomiting.  Endocrine: Negative for polydipsia and polyuria.  Genitourinary:  Negative for dysuria and hematuria.  Musculoskeletal:  Negative for neck pain and neck stiffness.  Skin:  Positive for rash (b/l UE).  Neurological:  Negative for dizziness, weakness, numbness and headaches.  Psychiatric/Behavioral:  Negative for agitation and behavioral problems.      Objective:    Physical Exam Vitals reviewed.  Constitutional:      General: He is not in acute distress.    Appearance: He is not diaphoretic.  HENT:     Head: Normocephalic and atraumatic.     Nose: Nose normal.     Mouth/Throat:     Mouth: Mucous membranes are moist.  Eyes:     General: No scleral icterus.    Extraocular Movements: Extraocular movements intact.     Pupils: Pupils are equal, round, and reactive to light.  Cardiovascular:     Rate and Rhythm: Normal rate and regular rhythm.     Pulses: Normal pulses.     Heart sounds: No murmur heard.   No friction rub. No gallop.  Pulmonary:     Effort: Pulmonary effort is normal. No respiratory distress.     Breath sounds: No wheezing or rales.  Abdominal:     General: Bowel sounds are normal.     Palpations: Abdomen is soft.     Tenderness: There is no abdominal tenderness.     Hernia: A hernia (Umbilical) is present.  Musculoskeletal:        General: No swelling or signs of injury.     Cervical back: Neck supple. No rigidity or tenderness.     Right lower leg: No edema.     Left lower leg: No edema.  Skin:    General: Skin is warm.     Findings: Rash (Erythematous plaques over b/l UE) present.  Neurological:     General: No focal deficit present.     Mental  Status: He is alert and oriented to person, place, and time.     Sensory: No sensory deficit.     Motor: No weakness.  Psychiatric:        Mood and Affect: Mood normal.        Behavior: Behavior normal.    BP 111/71 (BP Location: Left Arm, Patient Position: Sitting, Cuff Size: Large)   Pulse 66   Temp 98.2 F (36.8 C)   Resp 18   Ht '6\' 5"'  (1.956 m)  Wt 187 lb (84.8 kg)   SpO2 94%   BMI 22.17 kg/m  Wt Readings from Last 3 Encounters:  08/06/21 187 lb (84.8 kg)  08/03/21 195 lb (88.5 kg)  05/05/21 192 lb 6.4 oz (87.3 kg)     Health Maintenance Due  Topic Date Due   Hepatitis C Screening  Never done   Zoster Vaccines- Shingrix (1 of 2) Never done   PNA vac Low Risk Adult (1 of 2 - PCV13) Never done   COVID-19 Vaccine (3 - Booster for Moderna series) 09/22/2020   INFLUENZA VACCINE  07/20/2021    There are no preventive care reminders to display for this patient.  Lab Results  Component Value Date   TSH 2.060 06/25/2021   Lab Results  Component Value Date   WBC 13.2 (H) 08/03/2021   HGB 11.9 (L) 08/03/2021   HCT 38.0 (L) 08/03/2021   MCV 94.1 08/03/2021   PLT 258 08/03/2021   Lab Results  Component Value Date   NA 135 08/03/2021   K 5.0 08/03/2021   CO2 19 (L) 08/03/2021   GLUCOSE 102 (H) 08/03/2021   BUN 55 (H) 08/03/2021   CREATININE 3.67 (H) 08/03/2021   BILITOT 0.7 08/03/2021   ALKPHOS 54 08/03/2021   AST 15 08/03/2021   ALT 12 08/03/2021   PROT 7.2 08/03/2021   ALBUMIN 4.0 08/03/2021   CALCIUM 8.9 08/03/2021   ANIONGAP 10 08/03/2021   EGFR 13 (L) 07/30/2021   Lab Results  Component Value Date   CHOL 220 (H) 06/25/2021   Lab Results  Component Value Date   HDL 46 06/25/2021   Lab Results  Component Value Date   LDLCALC 139 (H) 06/25/2021   Lab Results  Component Value Date   TRIG 196 (H) 06/25/2021   Lab Results  Component Value Date   CHOLHDL 4.8 06/25/2021   Lab Results  Component Value Date   HGBA1C 6.2 (H) 06/25/2021       Assessment & Plan:   Problem List Items Addressed This Visit       Cardiovascular and Mediastinum   Essential hypertension    BP Readings from Last 1 Encounters:  08/06/21 111/71  Well-controlled with Valsartan-HCTZ Counseled for compliance with the medications Advised DASH diet and moderate exercise/walking, at least 150 mins/week        Respiratory   COPD GOLD IV/ Group D     Quit smoking in 2017 On Stiolto On oral Prednisone 10 mg QD Albuterol nebs and inhaler PRN Follows up with Dr Melvyn Novas        Genitourinary   CKD (chronic kidney disease) stage 4, GFR 15-29 ml/min ([place]) - Primary    AKI on CKD Some effect could be due to dehydration in the setting of diarrhea, which has resolved Was referred to ER, but he left AMA Not anuric currently Last BMP showed better Cr. with electrolytes wnl On ARB and HCTZ Avoid nephrotoxic agents Reviewed US renal from ER Check CMP today Urgent referral to Nephrology      Relevant Orders   CMP14+EGFR   Ambulatory referral to Nephrology   Urinalysis, Routine w reflex microscopic     Other   Encounter for examination following treatment at hospital    ER chart reviewed, including imaging Was given IV fluids in ER for AKI Left AMA from ER       No orders of the defined types were placed in this encounter.   Follow-up: Return in about 4 weeks (around  09/03/2021) for AKI on CKD.    Lindell Spar, MD

## 2021-08-06 NOTE — Assessment & Plan Note (Signed)
AKI on CKD Some effect could be due to dehydration in the setting of diarrhea, which has resolved Was referred to ER, but he left AMA Not anuric currently Last BMP showed better Cr. with electrolytes wnl On ARB and HCTZ Avoid nephrotoxic agents Reviewed US renal from ER Check CMP today Urgent referral to Nephrology

## 2021-08-06 NOTE — Assessment & Plan Note (Addendum)
ER chart reviewed, including imaging Was given IV fluids in ER for AKI Left AMA from ER

## 2021-08-06 NOTE — Patient Instructions (Signed)
Please increase fluid intake to at least 64 ounces per day.  If you have trouble voiding or if you notice blood in urine, please go to ER immediately.

## 2021-08-07 LAB — URINALYSIS, ROUTINE W REFLEX MICROSCOPIC
Bilirubin, UA: NEGATIVE
Glucose, UA: NEGATIVE
Ketones, UA: NEGATIVE
Leukocytes,UA: NEGATIVE
Nitrite, UA: NEGATIVE
Protein,UA: NEGATIVE
RBC, UA: NEGATIVE
Specific Gravity, UA: 1.018 (ref 1.005–1.030)
Urobilinogen, Ur: 0.2 mg/dL (ref 0.2–1.0)
pH, UA: 5.5 (ref 5.0–7.5)

## 2021-08-07 LAB — CMP14+EGFR
ALT: 9 IU/L (ref 0–44)
AST: 14 IU/L (ref 0–40)
Albumin/Globulin Ratio: 1.7 (ref 1.2–2.2)
Albumin: 4.3 g/dL (ref 3.8–4.8)
Alkaline Phosphatase: 57 IU/L (ref 44–121)
BUN/Creatinine Ratio: 13 (ref 10–24)
BUN: 33 mg/dL — ABNORMAL HIGH (ref 8–27)
Bilirubin Total: 0.3 mg/dL (ref 0.0–1.2)
CO2: 24 mmol/L (ref 20–29)
Calcium: 9.3 mg/dL (ref 8.6–10.2)
Chloride: 104 mmol/L (ref 96–106)
Creatinine, Ser: 2.51 mg/dL — ABNORMAL HIGH (ref 0.76–1.27)
Globulin, Total: 2.5 g/dL (ref 1.5–4.5)
Glucose: 98 mg/dL (ref 65–99)
Potassium: 5.7 mmol/L — ABNORMAL HIGH (ref 3.5–5.2)
Sodium: 141 mmol/L (ref 134–144)
Total Protein: 6.8 g/dL (ref 6.0–8.5)
eGFR: 27 mL/min/{1.73_m2} — ABNORMAL LOW (ref >59)

## 2021-08-21 ENCOUNTER — Ambulatory Visit: Payer: Medicare Other

## 2021-08-25 ENCOUNTER — Other Ambulatory Visit: Payer: Self-pay | Admitting: Internal Medicine

## 2021-08-25 DIAGNOSIS — L309 Dermatitis, unspecified: Secondary | ICD-10-CM

## 2021-08-25 IMAGING — DX DG CHEST 2V
2 series · 2 of 2 positions shown · non-contrast
Comparison: 07/28/2017

CLINICAL DATA: Dyspnea on exertion

EXAM:
CHEST - 2 VIEW

[chest pa]
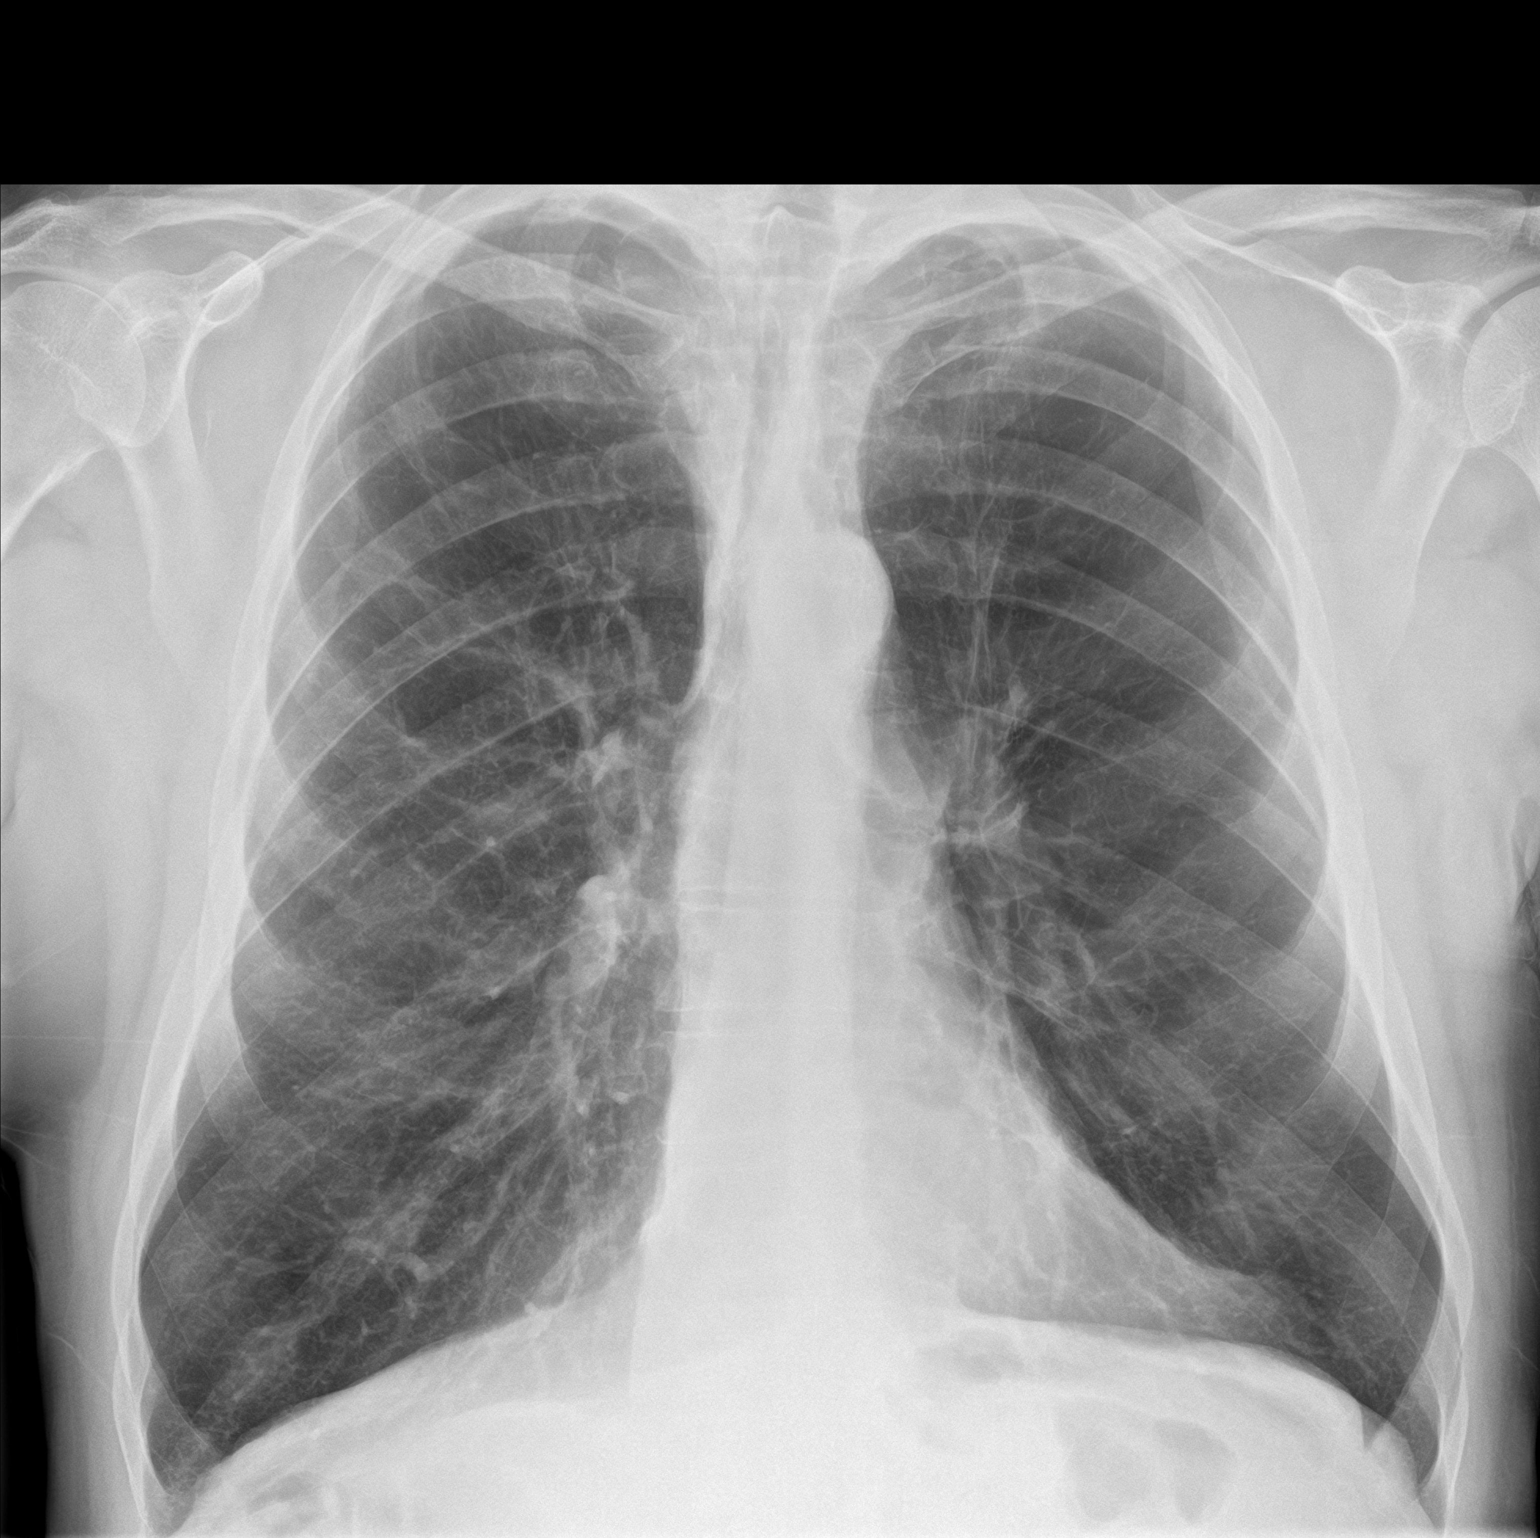

[chest lat]
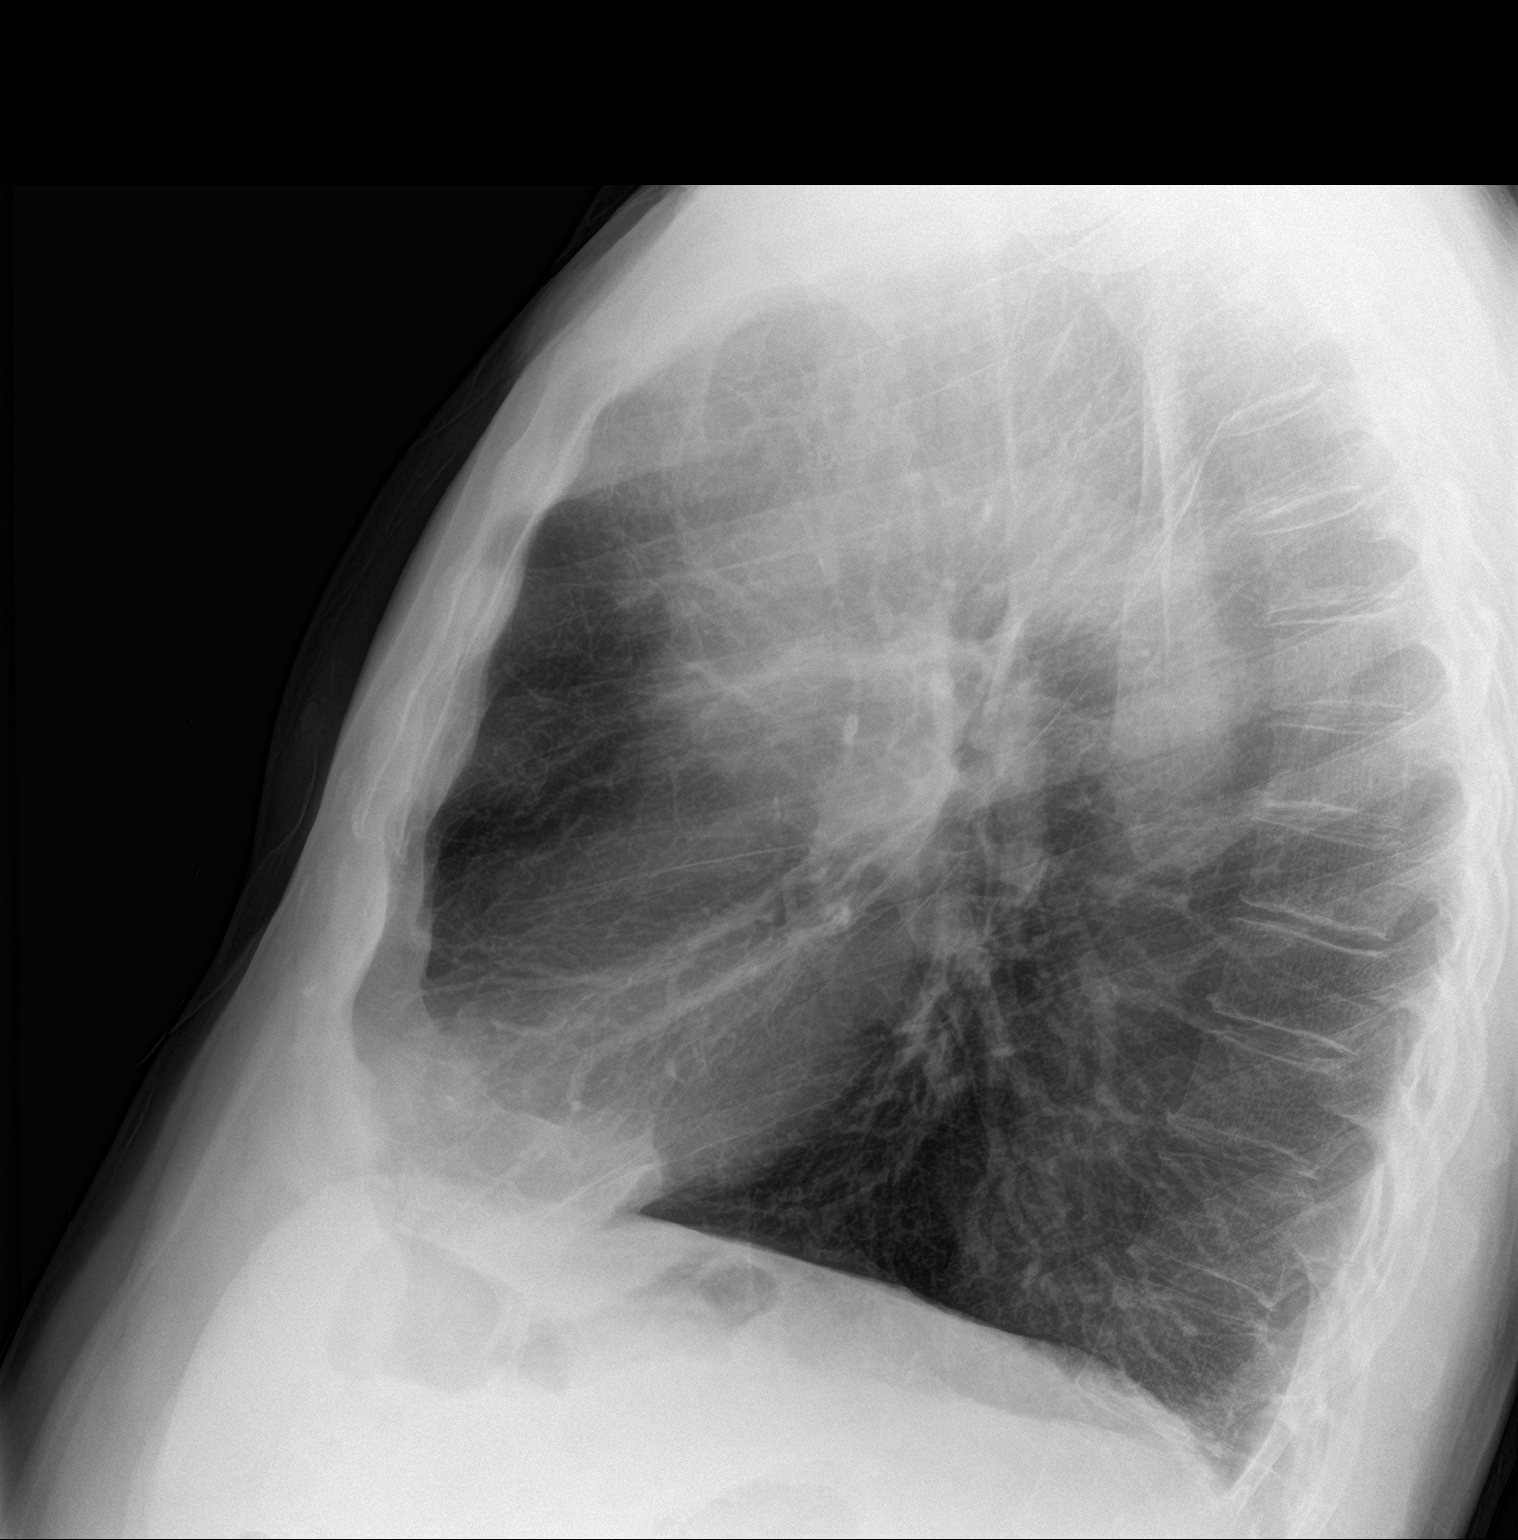

[2 of 2 positions shown; findings below may reference images not displayed]

FINDINGS: Hyperinflation with emphysematous disease. No acute opacity or
pleural effusion. Stable cardiomediastinal silhouette. No
pneumothorax.
IMPRESSION: No active cardiopulmonary disease. Hyperinflation with emphysematous
disease

## 2021-09-01 ENCOUNTER — Other Ambulatory Visit: Payer: Self-pay

## 2021-09-01 ENCOUNTER — Ambulatory Visit (INDEPENDENT_AMBULATORY_CARE_PROVIDER_SITE_OTHER): Payer: Medicare Other

## 2021-09-01 DIAGNOSIS — Z0000 Encounter for general adult medical examination without abnormal findings: Secondary | ICD-10-CM | POA: Diagnosis not present

## 2021-09-01 NOTE — Progress Notes (Signed)
Subjective:   Scott Hubbard is a 65 y.o. male who presents for an Initial Medicare Annual Wellness Visit.I connected with  Marin A Qin on 09/01/21 by a audio enabled telemedicine application and verified that I am speaking with the correct person using two identifiers.   I discussed the limitations of evaluation and management by telemedicine. The patient expressed understanding and agreed to proceed.  Location of patient:Home  Location of Provider:Office  Persons participating in virtual visit: Scott Hubbard (patient) and Shadee Rathod,CMA  Review of Systems    Defer to PCP       Objective:    Today's Vitals   09/01/21 1506  PainSc: 0-No pain   There is no height or weight on file to calculate BMI.  Advanced Directives 09/01/2021 08/03/2021 10/03/2018 04/28/2016  Does Patient Have a Medical Advance Directive? No No No No  Would patient like information on creating a medical advance directive? - No - Patient declined - -    Current Medications (verified) Outpatient Encounter Medications as of 09/01/2021  Medication Sig   albuterol (PROVENTIL) (2.5 MG/3ML) 0.083% nebulizer solution Take 3 mLs (2.5 mg total) by nebulization every 4 (four) hours as needed for wheezing or shortness of breath.   albuterol (VENTOLIN HFA) 108 (90 Base) MCG/ACT inhaler Inhale 2 puffs into the lungs every 4 (four) hours as needed for wheezing or shortness of breath.   cyclobenzaprine (FLEXERIL) 10 MG tablet TAKE 1 TABLET BY MOUTH TWICE A DAY AS NEEDED FOR MUSCLE SPASMS   famotidine (PEPCID) 20 MG tablet One after supper   pantoprazole (PROTONIX) 40 MG tablet TAKE 1 TABLET (40 MG TOTAL) BY MOUTH DAILY. TAKE 30-60 MIN BEFORE FIRST MEAL OF THE DAY   predniSONE (DELTASONE) 10 MG tablet TAKE 2 DAILY UNTIL BETTER THEN 1 DAILY X 5 DAYS AND STOP   Tiotropium Bromide-Olodaterol (STIOLTO RESPIMAT) 2.5-2.5 MCG/ACT AERS Inhale 2 puffs into the lungs daily.   triamcinolone cream (KENALOG) 0.1 % APPLY TO AFFECTED AREA  TWICE A DAY   valsartan-hydrochlorothiazide (DIOVAN HCT) 80-12.5 MG tablet Take 1 tablet by mouth daily.   No facility-administered encounter medications on file as of 09/01/2021.    Allergies (verified) Patient has no known allergies.   History: Past Medical History:  Diagnosis Date   Chest discomfort    negative stress echocardiogram-01/2011   Chronic bronchitis    COPD (chronic obstructive pulmonary disease) (HCC)    DJD (degenerative joint disease)    Gastro-esophageal reflux disease with esophagitis    GERD (gastroesophageal reflux disease)    Hyperlipidemia    Hypertension    Migraine    PVC (premature ventricular contraction)    Frequent   Tobacco abuse    40 pack years   Past Surgical History:  Procedure Laterality Date   None     History reviewed. No pertinent family history. Social History   Socioeconomic History   Marital status: Legally Separated    Spouse name: Not on file   Number of children: 2   Years of education: Not on file   Highest education level: Not on file  Occupational History   Occupation: unemployed    Employer: UNEMPLOYED  Tobacco Use   Smoking status: Former    Packs/day: 1.50    Years: 40.00    Pack years: 60.00    Types: Cigarettes    Quit date: 04/27/2016    Years since quitting: 5.3   Smokeless tobacco: Never  Vaping Use   Vaping Use: Never used  Substance and Sexual Activity   Alcohol use: No   Drug use: No   Sexual activity: Not on file  Other Topics Concern   Not on file  Social History Narrative   Lives with girlfriend.  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     Social Determinants of Health   Financial Resource Strain: Low Risk    Difficulty of Paying Living Expenses: Not very hard  Food Insecurity: No Food Insecurity   Worried About Charity fundraiser in the Last Year: Never true   Ran Out of Food in the Last Year: Never true  Transportation Needs: No Transportation Needs   Lack of Transportation (Medical): No    Lack of Transportation (Non-Medical): No  Physical Activity: Inactive   Days of Exercise per Week: 0 days   Minutes of Exercise per Session: 0 min  Stress: No Stress Concern Present   Feeling of Stress : Not at all  Social Connections: Moderately Integrated   Frequency of Communication with Friends and Family: More than three times a week   Frequency of Social Gatherings with Friends and Family: Never   Attends Religious Services: Never   Marine scientist or Organizations: Yes   Attends Music therapist: Never   Marital Status: Living with partner    Tobacco Counseling Counseling given: Not Answered   Clinical Intake:  Pre-visit preparation completed: Yes  Pain : No/denies pain Pain Score: 0-No pain     Diabetes: No  How often do you need to have someone help you when you read instructions, pamphlets, or other written materials from your doctor or pharmacy?: 1 - Never What is the last grade level you completed in school?: GED  Diabetic?NO  Interpreter Needed?: No  Information entered by :: Hazelene Doten J,CMA   Activities of Daily Living In your present state of health, do you have any difficulty performing the following activities: 09/01/2021 11/24/2020  Hearing? N N  Vision? N N  Difficulty concentrating or making decisions? N N  Walking or climbing stairs? N N  Dressing or bathing? N N  Doing errands, shopping? N N  Preparing Food and eating ? N -  Using the Toilet? N -  In the past six months, have you accidently leaked urine? Y -  Do you have problems with loss of bowel control? N -  Managing your Medications? N -  Managing your Finances? N -  Housekeeping or managing your Housekeeping? N -  Some recent data might be hidden    Patient Care Team: Lindell Spar, MD as PCP - General (Internal Medicine) Lattie Haw, Cristopher Estimable, MD (Inactive) (Cardiology)  Indicate any recent Medical Services you may have received from other than [facility] providers in  the past year (date may be approximate).     Assessment:   This is a routine wellness examination for Scott Hubbard.  Hearing/Vision screen No results found.  Dietary issues and exercise activities discussed: Current Exercise Habits: The patient does not participate in regular exercise at present   Goals Addressed   None    Depression Screen PHQ 2/9 Scores 09/01/2021 08/06/2021 07/30/2021 03/03/2021 11/24/2020 10/09/2020 03/27/2020  PHQ - 2 Score 0 2 0 0 0 0 0  PHQ- 9 Score 0 9 - - - - -    Fall Risk Fall Risk  09/01/2021 08/06/2021 07/30/2021 03/03/2021 01/13/2021  Falls in the past year? 1 1 0 1 0  Number falls in past yr: 1 1 0 0 -  Injury  with Fall? 1 0 0 1 -  Risk for fall due to : No Fall Risks Impaired balance/gait No Fall Risks History of fall(s) -  Follow up Falls evaluation completed Falls evaluation completed Falls evaluation completed Falls evaluation completed Falls evaluation completed    [place]:  Any stairs in or around the home? Yes  If so, are there any without handrails? No  Home free of loose throw rugs in walkways, pet beds, electrical cords, etc? Yes  Adequate lighting in your home to reduce risk of falls? Yes   ASSISTIVE DEVICES UTILIZED TO PREVENT FALLS:  Life alert? No  Use of a cane, walker or w/c? No  Grab bars in the bathroom? No  Shower chair or bench in shower? No  Elevated toilet seat or a handicapped toilet? No   TIMED UP AND GO:  Was the test performed?  N/A .  Length of time to ambulate 10 feet: N/A sec.     Cognitive Function:     6CIT Screen 09/01/2021  What Year? 0 points  What month? 0 points  What time? 0 points  Count back from 20 0 points  Months in reverse 0 points  Repeat phrase 0 points  Total Score 0    Immunizations Immunization History  Administered Date(s) Administered   Moderna Sars-Covid-2 Vaccination 03/25/2020, 04/22/2020   Tdap 12/11/2003, 12/20/2008, 10/03/2018    TDAP status:  Up to date  Flu Vaccine status: Due, Education has been provided regarding the importance of this vaccine. Advised may receive this vaccine at local pharmacy or Health Dept. Aware to provide a copy of the vaccination record if obtained from local pharmacy or Health Dept. Verbalized acceptance and understanding.  Pneumococcal vaccine status: Due, Education has been provided regarding the importance of this vaccine. Advised may receive this vaccine at local pharmacy or Health Dept. Aware to provide a copy of the vaccination record if obtained from local pharmacy or Health Dept. Verbalized acceptance and understanding.  Covid-19 vaccine status: Information provided on how to obtain vaccines.   Qualifies for Shingles Vaccine? Yes   Zostavax completed No   Shingrix Completed?: No.    Education has been provided regarding the importance of this vaccine. Patient has been advised to call insurance company to determine out of pocket expense if they have not yet received this vaccine. Advised may also receive vaccine at local pharmacy or Health Dept. Verbalized acceptance and understanding.  Screening Tests Health Maintenance  Topic Date Due   Hepatitis C Screening  Never done   Zoster Vaccines- Shingrix (1 of 2) Never done   PNA vac Low Risk Adult (1 of 2 - PCV13) Never done   COVID-19 Vaccine (3 - Booster for Moderna series) 09/22/2020   INFLUENZA VACCINE  Never done   COLONOSCOPY (Pts 45-5yrs Insurance coverage will need to be confirmed)  08/20/2022 (Originally 03/23/1999)   TETANUS/TDAP  10/03/2028   HPV VACCINES  Aged Out    Health Maintenance  Health Maintenance Due  Topic Date Due   Hepatitis C Screening  Never done   Zoster Vaccines- Shingrix (1 of 2) Never done   PNA vac Low Risk Adult (1 of 2 - PCV13) Never done   COVID-19 Vaccine (3 - Booster for Moderna series) 09/22/2020   INFLUENZA VACCINE  Never done    Colon Cancer Screening: Patient will discuss options with PCP.   Lung  Cancer Screening: (Low Dose CT Chest recommended if Age 98-80 years, 34  pack-year currently smoking OR have quit w/in 15years.) does qualify.   Lung Cancer Screening Referral: NO  Additional Screening:  Hepatitis C Screening: does qualify; Not Completed   Vision Screening: Recommended annual ophthalmology exams for early detection of glaucoma and other disorders of the eye. Is the patient up to date with their annual eye exam?  No  Who is the provider or what is the name of the office in which the patient attends annual eye exams? N/A If pt is not established with a provider, would they like to be referred to a provider to establish care? No .   Dental Screening: Recommended annual dental exams for proper oral hygiene  Community Resource Referral / Chronic Care Management: CRR required this visit?  No   CCM required this visit?  No      Plan:     I have personally reviewed and noted the following in the patient's chart:   Medical and social history Use of alcohol, tobacco or illicit drugs  Current medications and supplements including opioid prescriptions. Patient is not currently taking opioid prescriptions. Functional ability and status Nutritional status Physical activity Advanced directives List of other physicians Hospitalizations, surgeries, and ER visits in previous 12 months Vitals Screenings to include cognitive, depression, and falls Referrals and appointments  In addition, I have reviewed and discussed with patient certain preventive protocols, quality metrics, and best practice recommendations. A written personalized care plan for preventive services as well as general preventive health recommendations were provided to patient.     Edgar Frisk, [facility]   09/01/2021   Nurse Notes: Non Face to Face 30 minute visit    Scott Hubbard , Thank you for taking time to come for your Medicare Wellness Visit. I appreciate your ongoing commitment to your health goals.  Please review the following plan we discussed and let me know if I can assist you in the future.   These are the goals we discussed:  Goals   None     This is a list of the screening recommended for you and due dates:  Health Maintenance  Topic Date Due   Hepatitis C Screening: USPSTF Recommendation to screen - Ages 9-79 yo.  Never done   Zoster (Shingles) Vaccine (1 of 2) Never done   Pneumonia vaccines (1 of 2 - PCV13) Never done   COVID-19 Vaccine (3 - Booster for Moderna series) 09/22/2020   Flu Shot  Never done   Colon Cancer Screening  08/20/2022*   Tetanus Vaccine  10/03/2028   HPV Vaccine  Aged Out  *Topic was postponed. The date shown is not the original due date.

## 2021-09-01 NOTE — Patient Instructions (Signed)
Health Maintenance, Male Adopting a healthy lifestyle and getting preventive care are important in promoting health and wellness. Ask your health care provider about: The right schedule for you to have regular tests and exams. Things you can do on your own to prevent diseases and keep yourself healthy. What should I know about diet, weight, and exercise? Eat a healthy diet  Eat a diet that includes plenty of vegetables, fruits, low-fat dairy products, and lean protein. Do not eat a lot of foods that are high in solid fats, added sugars, or sodium. Maintain a healthy weight Body mass index (BMI) is a measurement that can be used to identify possible weight problems. It estimates body fat based on height and weight. Your health care provider can help determine your BMI and help you achieve or maintain a healthy weight. Get regular exercise Get regular exercise. This is one of the most important things you can do for your health. Most adults should: Exercise for at least 150 minutes each week. The exercise should increase your heart rate and make you sweat (moderate-intensity exercise). Do strengthening exercises at least twice a week. This is in addition to the moderate-intensity exercise. Spend less time sitting. Even light physical activity can be beneficial. Watch cholesterol and blood lipids Have your blood tested for lipids and cholesterol at 67 years of age, then have this test every 5 years. You may need to have your cholesterol levels checked more often if: Your lipid or cholesterol levels are high. You are older than 67 years of age. You are at high risk for heart disease. What should I know about cancer screening? Many types of cancers can be detected early and may often be prevented. Depending on your health history and family history, you may need to have cancer screening at various ages. This may include screening for: Colorectal cancer. Prostate cancer. Skin cancer. Lung  cancer. What should I know about heart disease, diabetes, and high blood pressure? Blood pressure and heart disease High blood pressure causes heart disease and increases the risk of stroke. This is more likely to develop in people who have high blood pressure readings, are of African descent, or are overweight. Talk with your health care provider about your target blood pressure readings. Have your blood pressure checked: Every 3-5 years if you are 18-39 years of age. Every year if you are 40 years old or older. If you are between the ages of 65 and 75 and are a current or former smoker, ask your health care provider if you should have a one-time screening for abdominal aortic aneurysm (AAA). Diabetes Have regular diabetes screenings. This checks your fasting blood sugar level. Have the screening done: Once every three years after age 45 if you are at a normal weight and have a low risk for diabetes. More often and at a younger age if you are overweight or have a high risk for diabetes. What should I know about preventing infection? Hepatitis B If you have a higher risk for hepatitis B, you should be screened for this virus. Talk with your health care provider to find out if you are at risk for hepatitis B infection. Hepatitis C Blood testing is recommended for: Everyone born from 1945 through 1965. Anyone with known risk factors for hepatitis C. Sexually transmitted infections (STIs) You should be screened each year for STIs, including gonorrhea and chlamydia, if: You are sexually active and are younger than 67 years of age. You are older than 67 years   of age and your health care provider tells you that you are at risk for this type of infection. Your sexual activity has changed since you were last screened, and you are at increased risk for chlamydia or gonorrhea. Ask your health care provider if you are at risk. Ask your health care provider about whether you are at high risk for HIV.  Your health care provider may recommend a prescription medicine to help prevent HIV infection. If you choose to take medicine to prevent HIV, you should first get tested for HIV. You should then be tested every 3 months for as long as you are taking the medicine. Follow these instructions at home: Lifestyle Do not use any products that contain nicotine or tobacco, such as cigarettes, e-cigarettes, and chewing tobacco. If you need help quitting, ask your health care provider. Do not use street drugs. Do not share needles. Ask your health care provider for help if you need support or information about quitting drugs. Alcohol use Do not drink alcohol if your health care provider tells you not to drink. If you drink alcohol: Limit how much you have to 0-2 drinks a day. Be aware of how much alcohol is in your drink. In the [place] one drink equals one 12 oz bottle of beer (355 mL), one 5 oz glass of wine (148 mL), or one 1 oz glass of hard liquor (44 mL). General instructions Schedule regular health, dental, and eye exams. Stay current with your vaccines. Tell your health care provider if: You often feel depressed. You have ever been abused or do not feel safe at home. Summary Adopting a healthy lifestyle and getting preventive care are important in promoting health and wellness. Follow your health care provider's instructions about healthy diet, exercising, and getting tested or screened for diseases. Follow your health care provider's instructions on monitoring your cholesterol and blood pressure. This information is not intended to replace advice given to you by your health care provider. Make sure you discuss any questions you have with your health care provider. Document Revised: 02/13/2021 Document Reviewed: 11/29/2018 Elsevier Patient Education  2022 [employer]  

## 2021-09-03 ENCOUNTER — Encounter: Payer: Self-pay | Admitting: Internal Medicine

## 2021-09-03 ENCOUNTER — Other Ambulatory Visit: Payer: Self-pay

## 2021-09-03 ENCOUNTER — Ambulatory Visit (INDEPENDENT_AMBULATORY_CARE_PROVIDER_SITE_OTHER): Payer: Medicare Other | Admitting: Internal Medicine

## 2021-09-03 VITALS — BP 115/59 | HR 68 | Resp 18 | Ht 77.0 in | Wt 184.1 lb

## 2021-09-03 DIAGNOSIS — N179 Acute kidney failure, unspecified: Secondary | ICD-10-CM | POA: Insufficient documentation

## 2021-09-03 DIAGNOSIS — Z1159 Encounter for screening for other viral diseases: Secondary | ICD-10-CM

## 2021-09-03 DIAGNOSIS — J3089 Other allergic rhinitis: Secondary | ICD-10-CM | POA: Diagnosis not present

## 2021-09-03 DIAGNOSIS — J449 Chronic obstructive pulmonary disease, unspecified: Secondary | ICD-10-CM | POA: Diagnosis not present

## 2021-09-03 DIAGNOSIS — I10 Essential (primary) hypertension: Secondary | ICD-10-CM

## 2021-09-03 MED ORDER — FLUTICASONE PROPIONATE 50 MCG/ACT NA SUSP: 2.0000 | Freq: Every day | NASAL | 6 refills | Status: DC

## 2021-09-03 NOTE — Progress Notes (Signed)
Acute Office Visit  Subjective:    Patient ID: Scott Hubbard, male    DOB: 01/03/1956, 65 y.o.   MRN: [id number]  Chief Complaint  Patient presents with   Follow-up    4 week follow up would like his nose looked at sore on the inside x1 week    HPI Patient is in today for f/u of AKI on CKD and c/o nasal soreness.  His S. Cr. had been elevated, but was downtrending. He has not been taking adequate PO fluids. His diarrhea has resolved. Denies any dysuria or hematuria currently.  He c/o nasal soreness over right side. He has chronic nasal congestion, dyspnea and wheezing. Denies any fever, chills, sore throat, nausea or vomiting.  Past Medical History:  Diagnosis Date   Chest discomfort    negative stress echocardiogram-01/2011   Chronic bronchitis    COPD (chronic obstructive pulmonary disease) (HCC)    DJD (degenerative joint disease)    Gastro-esophageal reflux disease with esophagitis    GERD (gastroesophageal reflux disease)    Hyperlipidemia    Hypertension    Migraine    PVC (premature ventricular contraction)    Frequent   Tobacco abuse    40 pack years    Past Surgical History:  Procedure Laterality Date   None      History reviewed. No pertinent family history.  Social History   Socioeconomic History   Marital status: Legally Separated    Spouse name: Not on file   Number of children: 2   Years of education: Not on file   Highest education level: Not on file  Occupational History   Occupation: unemployed    Employer: UNEMPLOYED  Tobacco Use   Smoking status: Former    Packs/day: 1.50    Years: 40.00    Pack years: 60.00    Types: Cigarettes    Quit date: 04/27/2016    Years since quitting: 5.3   Smokeless tobacco: Never  Vaping Use   Vaping Use: Never used  Substance and Sexual Activity   Alcohol use: No   Drug use: No   Sexual activity: Not on file  Other Topics Concern   Not on file  Social History Narrative   Lives with girlfriend.   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     Social Determinants of Health   Financial Resource Strain: Low Risk    Difficulty of Paying Living Expenses: Not very hard  Food Insecurity: No Food Insecurity   Worried About Charity fundraiser in the Last Year: Never true   Ran Out of Food in the Last Year: Never true  Transportation Needs: No Transportation Needs   Lack of Transportation (Medical): No   Lack of Transportation (Non-Medical): No  Physical Activity: Inactive   Days of Exercise per Week: 0 days   Minutes of Exercise per Session: 0 min  Stress: No Stress Concern Present   Feeling of Stress : Not at all  Social Connections: Moderately Integrated   Frequency of Communication with Friends and Family: More than three times a week   Frequency of Social Gatherings with Friends and Family: Never   Attends Religious Services: Never   Marine scientist or Organizations: Yes   Attends Archivist Meetings: Never   Marital Status: Living with partner  Intimate Partner Violence: Not At Risk   Fear of Current or Ex-Partner: No   Emotionally Abused: No   Physically Abused: No   Sexually Abused: No  Outpatient Medications Prior to Visit  Medication Sig Dispense Refill   albuterol (PROVENTIL) (2.5 MG/3ML) 0.083% nebulizer solution Take 3 mLs (2.5 mg total) by nebulization every 4 (four) hours as needed for wheezing or shortness of breath. 75 mL 12   albuterol (VENTOLIN HFA) 108 (90 Base) MCG/ACT inhaler Inhale 2 puffs into the lungs every 4 (four) hours as needed for wheezing or shortness of breath. 8.5 g 10   cyclobenzaprine (FLEXERIL) 10 MG tablet TAKE 1 TABLET BY MOUTH TWICE A DAY AS NEEDED FOR MUSCLE SPASMS 60 tablet 2   famotidine (PEPCID) 20 MG tablet One after supper 30 tablet 11   pantoprazole (PROTONIX) 40 MG tablet TAKE 1 TABLET (40 MG TOTAL) BY MOUTH DAILY. TAKE 30-60 MIN BEFORE FIRST MEAL OF THE DAY 90 tablet 3   predniSONE (DELTASONE) 10 MG tablet TAKE 2 DAILY UNTIL  BETTER THEN 1 DAILY X 5 DAYS AND STOP 100 tablet 1   Tiotropium Bromide-Olodaterol (STIOLTO RESPIMAT) 2.5-2.5 MCG/ACT AERS Inhale 2 puffs into the lungs daily. 1 each 11   triamcinolone cream (KENALOG) 0.1 % APPLY TO AFFECTED AREA TWICE A DAY 30 g 0   valsartan-hydrochlorothiazide (DIOVAN HCT) 80-12.5 MG tablet Take 1 tablet by mouth daily. 30 tablet 11   No facility-administered medications prior to visit.    No Known Allergies  Review of Systems  Constitutional:  Negative for chills and fever.  HENT:  Positive for congestion. Negative for sinus pressure, sinus pain and sore throat.   Eyes:  Negative for pain and discharge.  Respiratory:  Positive for shortness of breath (chronic) and wheezing. Negative for cough.   Cardiovascular:  Negative for chest pain and palpitations.  Gastrointestinal:  Negative for constipation, diarrhea, nausea and vomiting.  Endocrine: Negative for polydipsia and polyuria.  Genitourinary:  Negative for dysuria and hematuria.  Musculoskeletal:  Negative for neck pain and neck stiffness.  Skin:  Positive for rash (b/l UE).  Neurological:  Negative for dizziness, weakness, numbness and headaches.  Psychiatric/Behavioral:  Negative for agitation and behavioral problems.       Objective:    Physical Exam Vitals reviewed.  Constitutional:      General: He is not in acute distress.    Appearance: He is not diaphoretic.  HENT:     Head: Normocephalic and atraumatic.     Nose:     Comments: Nasal mucosal erythema with some erosions in right nostril    Mouth/Throat:     Mouth: Mucous membranes are moist.  Eyes:     General: No scleral icterus.    Extraocular Movements: Extraocular movements intact.  Cardiovascular:     Rate and Rhythm: Normal rate and regular rhythm.     Pulses: Normal pulses.     Heart sounds: Normal heart sounds. No murmur heard.   No friction rub. No gallop.  Pulmonary:     Effort: Pulmonary effort is normal. No respiratory  distress.     Breath sounds: Wheezing (B/l, diffuse (mild)) present. No rales.  Abdominal:     General: Bowel sounds are normal.     Palpations: Abdomen is soft.     Tenderness: There is no abdominal tenderness.     Hernia: A hernia (Umbilical) is present.  Musculoskeletal:        General: No swelling or signs of injury.     Cervical back: Neck supple. No tenderness.     Right lower leg: No edema.     Left lower leg: No edema.  Skin:  General: Skin is warm.     Findings: Rash (Erythematous plaques over b/l UE) present.  Neurological:     General: No focal deficit present.     Mental Status: He is alert and oriented to person, place, and time.     Sensory: No sensory deficit.     Motor: No weakness.  Psychiatric:        Mood and Affect: Mood normal.        Behavior: Behavior normal.    BP (!) 115/59 (BP Location: Left Arm, Patient Position: Sitting, Cuff Size: Normal)   Pulse 68   Resp 18   Ht _0  (1.956 m)   Wt 184 lb 1.9 oz (83.5 kg)   SpO2 93%   BMI 21.83 kg/m  Wt Readings from Last 3 Encounters:  09/03/21 184 lb 1.9 oz (83.5 kg)  08/06/21 187 lb (84.8 kg)  08/03/21 195 lb (88.5 kg)    Health Maintenance Due  Topic Date Due   Hepatitis C Screening  Never done   Zoster Vaccines- Shingrix (1 of 2) Never done   PNA vac Low Risk Adult (1 of 2 - PCV13) Never done   COVID-19 Vaccine (3 - Booster for Moderna series) 09/22/2020    There are no preventive care reminders to display for this patient.   Lab Results  Component Value Date   TSH 2.060 06/25/2021   Lab Results  Component Value Date   WBC 13.2 (H) 08/03/2021   HGB 11.9 (L) 08/03/2021   HCT 38.0 (L) 08/03/2021   MCV 94.1 08/03/2021   PLT 258 08/03/2021   Lab Results  Component Value Date   NA 141 08/06/2021   K 5.7 (H) 08/06/2021   CO2 24 08/06/2021   GLUCOSE 98 08/06/2021   BUN 33 (H) 08/06/2021   CREATININE 2.51 (H) 08/06/2021   BILITOT 0.3 08/06/2021   ALKPHOS 57 08/06/2021   AST 14  08/06/2021   ALT 9 08/06/2021   PROT 6.8 08/06/2021   ALBUMIN 4.3 08/06/2021   CALCIUM 9.3 08/06/2021   ANIONGAP 10 08/03/2021   EGFR 27 (L) 08/06/2021   Lab Results  Component Value Date   CHOL 220 (H) 06/25/2021   Lab Results  Component Value Date   HDL 46 06/25/2021   Lab Results  Component Value Date   LDLCALC 139 (H) 06/25/2021   Lab Results  Component Value Date   TRIG 196 (H) 06/25/2021   Lab Results  Component Value Date   CHOLHDL 4.8 06/25/2021   Lab Results  Component Value Date   HGBA1C 6.2 (H) 06/25/2021       Assessment & Plan:   Problem List Items Addressed This Visit       Cardiovascular and Mediastinum   Essential hypertension    BP Readings from Last 1 Encounters:  09/03/21 (!) 115/59  Well-controlled with Valsartan-HCTZ Counseled for compliance with the medications Advised DASH diet and moderate exercise/walking as tolerated        Respiratory   COPD GOLD IV/ Group D     Quit smoking in 2017 On Stiolto On oral Prednisone 10 mg QD Albuterol nebs and inhaler PRN Follows up with Dr Melvyn Novas      Relevant Medications   fluticasone (FLONASE) 50 MCG/ACT nasal spray     Genitourinary   AKI (acute kidney injury) ([place]) - Primary    AKI on CKD Some effect could be due to dehydration in the setting of diarrhea, which has resolved Was referred to ER,  but he left AMA Not anuric currently Last BMP showed better Cr. with electrolytes wnl On ARB and HCTZ Avoid nephrotoxic agents Reviewed US renal from ER Check BMP today      Relevant Orders   Basic Metabolic Panel (BMET)   Other Visit Diagnoses     Allergic rhinitis due to other allergic trigger, unspecified seasonality       Relevant Medications   fluticasone (FLONASE) 50 MCG/ACT nasal spray   Need for hepatitis C screening test       Relevant Orders   Hepatitis C Antibody        Meds ordered this encounter  Medications   fluticasone (FLONASE) 50 MCG/ACT nasal spray    Sig:  Place 2 sprays into both nostrils daily.    Dispense:  16 g    Refill:  6     [place]ton Cruzan Keith Rake, MD

## 2021-09-03 NOTE — Assessment & Plan Note (Signed)
AKI on CKD Some effect could be due to dehydration in the setting of diarrhea, which has resolved Was referred to ER, but he left AMA Not anuric currently Last BMP showed better Cr. with electrolytes wnl On ARB and HCTZ Avoid nephrotoxic agents Reviewed US renal from ER Check BMP today

## 2021-09-03 NOTE — Assessment & Plan Note (Signed)
Quit smoking in 2017 On Stiolto On oral Prednisone 10 mg QD Albuterol nebs and inhaler PRN Follows up with Dr Melvyn Novas

## 2021-09-03 NOTE — Assessment & Plan Note (Addendum)
BP Readings from Last 1 Encounters:  09/03/21 (!) 115/59   Well-controlled with Valsartan-HCTZ Counseled for compliance with the medications Advised DASH diet and moderate exercise/walking as tolerated

## 2021-09-03 NOTE — Patient Instructions (Signed)
Please use Flonase for nasal congestion/soreness.  Okay to apply nasal saline spray for nasal soreness as well.  Please use humidifier at nighttime to help with dry air.  Continue to take at least 64 ounces of fluid in a day.

## 2021-09-04 LAB — HEPATITIS C ANTIBODY: Hep C Virus Ab: <0.1 s/co ratio (ref 0.0–0.9)

## 2021-09-04 LAB — BASIC METABOLIC PANEL
BUN/Creatinine Ratio: 12 (ref 10–24)
BUN: 28 mg/dL — ABNORMAL HIGH (ref 8–27)
CO2: 25 mmol/L (ref 20–29)
Calcium: 9.3 mg/dL (ref 8.6–10.2)
Chloride: 99 mmol/L (ref 96–106)
Creatinine, Ser: 2.3 mg/dL — ABNORMAL HIGH (ref 0.76–1.27)
Glucose: 113 mg/dL — ABNORMAL HIGH (ref 65–99)
Potassium: 5.2 mmol/L (ref 3.5–5.2)
Sodium: 138 mmol/L (ref 134–144)
eGFR: 30 mL/min/{1.73_m2} — ABNORMAL LOW (ref >59)

## 2021-09-28 ENCOUNTER — Telehealth: Payer: Self-pay | Admitting: Internal Medicine

## 2021-09-28 NOTE — Telephone Encounter (Signed)
Patient called in with concerns of the Kidney referral that was sent in. He says nothing was discussed with him before hand, wants someone to call and clarify with him what is going on.

## 2021-10-13 ENCOUNTER — Other Ambulatory Visit: Payer: Self-pay | Admitting: Internal Medicine

## 2021-10-13 DIAGNOSIS — M62838 Other muscle spasm: Secondary | ICD-10-CM

## 2021-10-30 ENCOUNTER — Telehealth: Payer: Self-pay | Admitting: Internal Medicine

## 2021-10-30 ENCOUNTER — Other Ambulatory Visit: Payer: Self-pay | Admitting: *Deleted

## 2021-10-30 MED ORDER — VALSARTAN-HYDROCHLOROTHIAZIDE 80-12.5 MG PO TABS: 1.0000 | ORAL_TABLET | Freq: Every day | ORAL | 11 refills | Status: DC

## 2021-10-30 NOTE — Telephone Encounter (Signed)
ERROR

## 2021-10-30 NOTE — Telephone Encounter (Signed)
Pt called in for refill on VALSATRAN  Pt has been out for 2 days  [facility]

## 2021-10-30 NOTE — Telephone Encounter (Signed)
Rx was sent into pharmacy.  

## 2021-11-02 ENCOUNTER — Ambulatory Visit: Payer: Medicare Other | Admitting: Internal Medicine

## 2021-11-25 ENCOUNTER — Other Ambulatory Visit: Payer: Self-pay | Admitting: Internal Medicine

## 2021-12-14 ENCOUNTER — Other Ambulatory Visit: Payer: Self-pay | Admitting: Internal Medicine

## 2021-12-14 DIAGNOSIS — J3089 Other allergic rhinitis: Secondary | ICD-10-CM

## 2021-12-16 ENCOUNTER — Other Ambulatory Visit: Payer: Self-pay

## 2021-12-16 ENCOUNTER — Ambulatory Visit (INDEPENDENT_AMBULATORY_CARE_PROVIDER_SITE_OTHER): Payer: Medicare Other | Admitting: Internal Medicine

## 2021-12-16 ENCOUNTER — Encounter: Payer: Self-pay | Admitting: Internal Medicine

## 2021-12-16 DIAGNOSIS — R0789 Other chest pain: Secondary | ICD-10-CM | POA: Diagnosis not present

## 2021-12-16 DIAGNOSIS — J449 Chronic obstructive pulmonary disease, unspecified: Secondary | ICD-10-CM | POA: Diagnosis not present

## 2021-12-16 MED ORDER — STIOLTO RESPIMAT 2.5-2.5 MCG/ACT IN AERS: 2.0000 | INHALATION_SPRAY | Freq: Every day | RESPIRATORY_TRACT | 0 refills | Status: DC

## 2021-12-16 NOTE — Patient Instructions (Signed)
No change in medications   Please remember to go to the   x-ray department at [facility]   for your tests - we will call you with the results when they are available.   Please schedule a follow up visit in 6  months but call sooner if needed

## 2021-12-16 NOTE — Progress Notes (Signed)
Scott Hubbard, male    DOB: [birth year]    MRN: [id number]   Brief patient profile:  43 yowm MM/quit smoking 04/2016 @ onset of severe spell of sob and since then doe gradually worse despite rx with flovent/ stiolto so referred to pulmonary clinic 04/09/2020 by Dr  [name]     History of Present Illness  04/09/2020  Pulmonary/ 1st office eval/Erielle Gawronski  Chief Complaint  Patient presents with   Pulmonary Consult    Referred by Dr. [name] for eval of COPD. Former pt of Dr [name]. He states he gets SOB "when I do anything".    Dyspnea:  [employer] x 2 aisles but walks "faster than other people" (walked very slowly in office) so MMRC3 = can't walk 100 yards even at a slow pace at a flat grade s stopping due to sob    Last time mb and back was prior to prior to when he quit smoking as has long driveway with incline on way back  Cough: none Sleep: flat bed / one pillow L side s respiratory symptoms  SABA use: 2 pffs each am / does not know how when to use hfa vs neb     rec Plan A = Automatic = Always=    Stiolto x 2 pffs/ flovent x 2puffs  first thing in am and repeat flovent in pm  Prednisone 10 mg x 2 daily until breathing better then 1 daily x a week and stop (#60)     Take valsartan 40 mg x 2 pills daily until you see your cardiologist            Plan B = Backup (to supplement plan A, not to replace it) Only use your albuterol inhaler as a rescue medication    05/09/2020  f/u ov/Lynlee Stratton re: GOLD IV/ group D symptom/risk still on pred 10 mg daily  Chief Complaint  Patient presents with   Follow-up    Breathing is some better since the last visit. She is using her albuterol inhaler 1-2 x per day and has not needed neb.   Dyspnea:  Walking at [employer] easier now/ def better p pred x 6 days  Cough: none Sleeping: on side/ one pillow  SABA use: less 02: none        rec Try prednisone 10 mg one half daily x one week and stop    09/08/2020  f/u ov/Gavan Nordby re: GOLD 4 copd on flovent and stiolto   Chief Complaint  Patient presents with   Follow-up    Patient feels like his breathing is worse since last visit, shortness of breath with exertion, productive cough with white sputum, spouse states he eats salt like crazy.    Dyspnea:   Walking at [employer] x only  one aisle  = MMRC3 = can't walk 100 yards even at a slow pace at a flat grade s stopping due to sob   Cough: usual worse in am / mucoid  Sleeping: flat bed one pillow prefer L side down  SABA use: rarely  02: none  rec Plan A = Automatic = Always=    Breztri Take 2 puffs first thing in am and then another 2 puffs about 12 hours later. (replacing the flovent and stiolto) Prednisone 10 mg 2 daily until better then 1 daily x one week stop  Plan B = Backup (to supplement plan A, not to replace it) Only use your albuterol inhaler as a rescue medication Plan C =  Crisis (instead of Plan B but only if Plan B stops working) - only use your albuterol nebulizer if you first try Plan B and it fails to help Increase valsartan to 40 mg x 2 = 80 mg tablets on daily and avoid excess salt if possible       10/09/2020  f/u ov/[place] office/Marlaya Turck re: copd IV / hbp off acei  Maint rx = breztri, not using saba as rec "they didn't work in pastTour manager Complaint  Patient presents with   Follow-up    Breathing is worse since starting on [place]. He is using his albuterol inhaler 6-8 x per day.   Dyspnea:  Room to room  Cough:rattling but not productive worse in am  Sleeping: on side/ one pillow  SABA use: not using approp  02: none  rec Change valsartan to 160-12.5 one daily  Plan A = Automatic = Always=    Stiolto 2 puffs each am  (the same as taking breztri 2 every 12 hours)  Prednisone 10 mg 2 daily until better then 1 daily until return   Plan B = Backup (to supplement plan A, not to replace it) Only use your albuterol inhaler as a rescue medication  Plan C = Crisis (instead of Plan B but only if Plan B stops working) - only use  your albuterol nebulizer if you first try Plan B          05/05/2021  f/u ov/[place] office/Ahman Dugdale re:  COPD IV  stiolto / awaiting rehab  Chief Complaint  Patient presents with   Follow-up    Breathing is at baseline today. He is wheezing some and has occ dry cough. He is using his albuterol inhaler 12 x per wk.   Dyspnea:  Room to room  Cough:rattling but not productive/grey when it is productive,mostly in am's then clears Sleeping: flat bed/ on side one pillow  SABA use: as above  02: no  Covid status: x 3 Rec Plan A = Automatic = Always=   Stiolto 2 puffs each am  Plan B = Backup (to supplement plan A, not to replace it) Only use your albuterol inhaler as a rescue medication  Plan C = Crisis (instead of Plan B but only if Plan B stops working) - only use your albuterol nebulizer if you first try Plan B and it fails to help > ok to use the nebulizer up to every 4 hours but if start needing it regularly call for immediate appointment Plan D = Deltasone (prednisone) - if getting worse despite ABC - 10 mg x 2 each am until better, then 1 daily x 5 days and stop Ok to Try albuterol (first the [facility] one day and the nebulizer the next and then nothing the next time) 15 min before an activity that you know would make you short of breath   12/16/2021  f/u ov/[place] office/Marieliz Strang re: GOLD 4 copd  maint on stiolto/ no recent pred/ never went to rehab - can't afford   Chief Complaint  Patient presents with   Follow-up    Coughing and wheezing are about the same since last OV   Dyspnea:  able to push the buggy at [place] / haul wood Cough: not much production but still rattles  Sleeping: flat bed ok SABA use: less  02: none  Covid status: vax x 4  LUQ cp can  last a couple of secs/ couple min not pleuritic or ex     No obvious  day to day or daytime variability or assoc excess/ purulent sputum or mucus plugs or hemoptysis or  chest tightness, subjective wheeze or overt sinus or hb  symptoms.   Sleeping  without nocturnal  or early am exacerbation  of respiratory  c/o's or need for noct saba. Also denies any obvious fluctuation of symptoms with weather or environmental changes or other aggravating or alleviating factors except as outlined above   No unusual exposure hx or h/o childhood pna/ asthma or knowledge of premature birth.  Current Allergies, Complete Past Medical History, Past Surgical History, Family History, and Social History were reviewed in [employer] record.  ROS  The following are not active complaints unless bolded Hoarseness, sore throat, dysphagia, dental problems, itching, sneezing,  nasal congestion or discharge of excess mucus or purulent secretions, ear ache,   fever, chills, sweats, unintended wt loss or wt gain, classically pleuritic or exertional cp,  orthopnea pnd or arm/hand swelling  or leg swelling, presyncope, palpitations, abdominal pain, anorexia, nausea, vomiting, diarrhea  or change in bowel habits or change in bladder habits, change in stools or change in urine, dysuria, hematuria,  rash, arthralgias, visual complaints, headache, numbness, weakness or ataxia or problems with walking or coordination,  change in mood or  memory.        Current Meds  Medication Sig   albuterol (PROVENTIL) (2.5 MG/3ML) 0.083% nebulizer solution Take 3 mLs (2.5 mg total) by nebulization every 4 (four) hours as needed for wheezing or shortness of breath.   albuterol (VENTOLIN HFA) 108 (90 Base) MCG/ACT inhaler Inhale 2 puffs into the lungs every 4 (four) hours as needed for wheezing or shortness of breath.   cyclobenzaprine (FLEXERIL) 10 MG tablet TAKE 1 TABLET BY MOUTH TWICE A DAY AS NEEDED FOR MUSCLE SPASM   famotidine (PEPCID) 20 MG tablet TAKE 1 TABLET BY MOUTH DAILY AFTER SUPPER   fluticasone (FLONASE) 50 MCG/ACT nasal spray SPRAY 2 SPRAYS INTO EACH NOSTRIL EVERY DAY   pantoprazole (PROTONIX) 40 MG tablet TAKE 1 TABLET (40 MG TOTAL) BY  MOUTH DAILY. TAKE 30-60 MIN BEFORE FIRST MEAL OF THE DAY   predniSONE (DELTASONE) 10 MG tablet TAKE 2 DAILY UNTIL BETTER THEN 1 DAILY X 5 DAYS AND STOP   Tiotropium Bromide-Olodaterol (STIOLTO RESPIMAT) 2.5-2.5 MCG/ACT AERS Inhale 2 puffs into the lungs daily.   triamcinolone cream (KENALOG) 0.1 % APPLY TO AFFECTED AREA TWICE A DAY   valsartan-hydrochlorothiazide (DIOVAN HCT) 80-12.5 MG tablet Take 1 tablet by mouth daily.                                                                Past Medical History:  Diagnosis Date   Chest discomfort    negative stress echocardiogram-01/2011   Chronic bronchitis    COPD (chronic obstructive pulmonary disease) (HCC)    DJD (degenerative joint disease)    Dyspnea    Gastro-esophageal reflux disease with esophagitis    GERD (gastroesophageal reflux disease)    Hyperlipidemia    Hypertension    Migraine    PVC (premature ventricular contraction)    Frequent   Tobacco abuse    Tobacco abuse    40 pack years       Objective:    12/16/2021  183  05/05/2021        192  01/28/2021         203 11/06/2020      200  09/08/2020       195   05/09/20 197 lb (89.4 kg)  04/30/20 197 lb (89.4 kg)  04/09/20 197 lb (89.4 kg)     Vital signs reviewed  12/16/2021  - Note at rest 02 sats  97% on RA   General appearance:   amb obese wm nad  / rattling cough    Reports: Edentulous  HEENT : pt wearing mask not removed for exam due to covid -19 concerns.    NECK :  without JVD/Nodes/TM/ nl carotid upstrokes bilaterally   LUNGS: no acc muscle use,  Mod barrel  contour chest wall with bilateral  Distant bs s audible wheeze and  without cough on insp or exp maneuvers and mod  Hyperresonant  to  percussion bilaterally     CV:  RRR  no s3 or murmur or increase in P2, and no edema   ABD:  soft and nontender with pos mid insp Hoover's  in the supine position. No bruits or organomegaly appreciated, bowel sounds nl  MS:     ext warm without  deformities, calf tenderness, cyanosis or clubbing No obvious joint restrictions   SKIN: warm and dry without lesions    NEURO:  alert, approp, nl sensorium with  no motor or cerebellar deficits apparent.         CXR PA and Lateral:   12/16/2021 :    I personally reviewed images and agree with radiology impression as follows:    Did not go for cxr as rec     Assessment

## 2021-12-17 ENCOUNTER — Encounter: Payer: Self-pay | Admitting: Internal Medicine

## 2021-12-17 NOTE — Assessment & Plan Note (Signed)
Quit smoking 2017  - Spirometry  01/04/17  FEV1 0.99 (23%)  Ratio 0.28 p ? Prior  = Allergy profile 04/09/20  >  Eos 0. 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 04/09/2020  MM   Level 156  - 04/09/2020 rec pred 20 until better then 10 mg daily x one week wean - 05/09/2020  After extensive coaching inhaler device,  effectiveness =    90% > rec  continue flovent 220/ stiolto and completely  taper off prednisone  - PFT's  06/24/20  FEV1 1.15 (26 % ) ratio 0.27  p 35 % improvement from saba p stiolto prior to study with DLCO  11.45 (35%) corrects to 1.88 (46%)  for alv volume and FV curve classic curvature  - 09/08/2020  After extensive coaching inhaler device,  effectiveness =    90% so try breztri  - 10/09/2020  After extensive coaching inhaler device,  effectiveness =    90% with smi > change back to stiolto and pred ceiling 20/floor 10 and added protonix 40 mg q am ac as pseudowheeze on exam - 11/06/2020 added pm doses of pepcid/ gerd recs including bed blocks   - .11/06/2020 pred floor of 10 mg / ceiling of 20 mg -  11/06/2020   Walked RA  approx   250 ft  @ avg pace  stopped due to  Sob/ fatigue with sats still 96%  Typical of a pink puffer  -  12/15/2020  After extensive coaching inhaler device,  effectiveness =    90%  -  01/28/2021   Walked RA  approx   400 ft  @ slow to moderate pace  stopped due to  Sob  With sats 94% - 05/05/2021 resumed prednisone  D  2 a day until better then 1 a day x 5 days and stop.   Pt is Group B in terms of symptom/risk and laba/lama therefore appropriate rx at this point >>>  Continue stiolto with pred as plan D if flares though note this has not been a recent problem

## 2021-12-17 NOTE — Assessment & Plan Note (Addendum)
Onset ? 2021/ LUQ abd/lower chest =  fleeting and not pleuritic or ex "like a cramp"   C/w mscp or ibs > rec at a minimum to do cxr but declined   F/u prn flare           Each maintenance medication was reviewed in detail including emphasizing most importantly the difference between maintenance and prns and under what circumstances the prns are to be triggered using an action plan format where appropriate.  Total time for H and P, chart review, counseling, reviewing hfa/smi/neb device(s) and generating customized AVS unique to this office visit / same day charting > 30 min

## 2022-01-04 ENCOUNTER — Ambulatory Visit: Payer: Medicare Other | Admitting: Internal Medicine

## 2022-01-11 ENCOUNTER — Other Ambulatory Visit: Payer: Self-pay | Admitting: Internal Medicine

## 2022-01-11 DIAGNOSIS — M62838 Other muscle spasm: Secondary | ICD-10-CM

## 2022-01-25 ENCOUNTER — Ambulatory Visit: Payer: Medicare Other | Admitting: Internal Medicine

## 2022-01-27 ENCOUNTER — Ambulatory Visit: Payer: Medicare Other | Admitting: Internal Medicine

## 2022-02-04 ENCOUNTER — Other Ambulatory Visit: Payer: Self-pay

## 2022-02-04 ENCOUNTER — Encounter: Payer: Self-pay | Admitting: Internal Medicine

## 2022-02-04 ENCOUNTER — Ambulatory Visit (INDEPENDENT_AMBULATORY_CARE_PROVIDER_SITE_OTHER): Payer: Medicare Other | Admitting: Internal Medicine

## 2022-02-04 VITALS — BP 132/78 | HR 78 | Resp 18 | Ht 77.0 in | Wt 185.1 lb

## 2022-02-04 DIAGNOSIS — G2581 Restless legs syndrome: Secondary | ICD-10-CM

## 2022-02-04 DIAGNOSIS — N1832 Chronic kidney disease, stage 3b: Secondary | ICD-10-CM | POA: Insufficient documentation

## 2022-02-04 DIAGNOSIS — K219 Gastro-esophageal reflux disease without esophagitis: Secondary | ICD-10-CM | POA: Diagnosis not present

## 2022-02-04 DIAGNOSIS — I10 Essential (primary) hypertension: Secondary | ICD-10-CM

## 2022-02-04 DIAGNOSIS — J449 Chronic obstructive pulmonary disease, unspecified: Secondary | ICD-10-CM

## 2022-02-04 MED ORDER — ROPINIROLE HCL 0.5 MG PO TABS: 0.5000 mg | ORAL_TABLET | Freq: Every day | ORAL | 2 refills | Status: DC

## 2022-02-04 NOTE — Progress Notes (Signed)
Established Patient Office Visit  Subjective:  Patient ID: Scott Hubbard, male    DOB: 1956/08/24  Age: 66 y.o. MRN: [id number]  CC:  Chief Complaint  Patient presents with   Follow-up    4 month follow up HTN COPD CKD pt is doing ok     HPI Scott Hubbard is a 66 y.o. male with past medical history of COPD, hypertension, CKD and previous tobacco abuse who presents for f/u of his chronic medical conditions.  He complains of chronic leg cramps, for which he has used Flexeril.  He also reports urge to move his legs at nighttime.  HTN: BP is well-controlled. Takes medications regularly. Patient denies headache, dizziness, chest pain, or palpitations.  COPD: He complains of chronic cough and dyspnea, for which he uses Stiolto and as needed albuterol neb and inhaler.  Followed by Dr. Melvyn Novas.  He is not on oral steroids now.  CKD: He is GFR had improved last time to 30 from 13.  He currently denies any dysuria or hematuria.  Denies any urinary retention.  He admits that he does not drink water at all.  Drinks about 1 or 2 Gatorade a day and has stopped taking other soft drinks.  He denies pneumococcal and Shingrix vaccines in the office today.    Past Medical History:  Diagnosis Date   Chest discomfort    negative stress echocardiogram-01/2011   Chronic bronchitis    COPD (chronic obstructive pulmonary disease) (HCC)    DJD (degenerative joint disease)    Gastro-esophageal reflux disease with esophagitis    GERD (gastroesophageal reflux disease)    Hyperlipidemia    Hypertension    Migraine    PVC (premature ventricular contraction)    Frequent   Tobacco abuse    40 pack years    Past Surgical History:  Procedure Laterality Date   None      History reviewed. No pertinent family history.  Social History   Socioeconomic History   Marital status: Legally Separated    Spouse name: Not on file   Number of children: 2   Years of education: Not on file   Highest education  level: Not on file  Occupational History   Occupation: unemployed    Employer: UNEMPLOYED  Tobacco Use   Smoking status: Former    Packs/day: 1.50    Years: 40.00    Pack years: 60.00    Types: Cigarettes    Quit date: 04/27/2016    Years since quitting: 5.7   Smokeless tobacco: Never  Vaping Use   Vaping Use: Never used  Substance and Sexual Activity   Alcohol use: No   Drug use: No   Sexual activity: Not on file  Other Topics Concern   Not on file  Social History Narrative   Lives with girlfriend.  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     Social Determinants of Health   Financial Resource Strain: Low Risk    Difficulty of Paying Living Expenses: Not very hard  Food Insecurity: No Food Insecurity   Worried About Charity fundraiser in the Last Year: Never true   Ran Out of Food in the Last Year: Never true  Transportation Needs: No Transportation Needs   Lack of Transportation (Medical): No   Lack of Transportation (Non-Medical): No  Physical Activity: Inactive   Days of Exercise per Week: 0 days   Minutes of Exercise per Session: 0 min  Stress: No Stress Concern Present  Feeling of Stress : Not at all  Social Connections: Moderately Integrated   Frequency of Communication with Friends and Family: More than three times a week   Frequency of Social Gatherings with Friends and Family: Never   Attends Religious Services: Never   Marine scientist or Organizations: Yes   Attends Archivist Meetings: Never   Marital Status: Living with partner  Intimate Partner Violence: Not At Risk   Fear of Current or Ex-Partner: No   Emotionally Abused: No   Physically Abused: No   Sexually Abused: No    Outpatient Medications Prior to Visit  Medication Sig Dispense Refill   albuterol (PROVENTIL) (2.5 MG/3ML) 0.083% nebulizer solution Take 3 mLs (2.5 mg total) by nebulization every 4 (four) hours as needed for wheezing or shortness of breath. 75 mL 12   albuterol  (VENTOLIN HFA) 108 (90 Base) MCG/ACT inhaler Inhale 2 puffs into the lungs every 4 (four) hours as needed for wheezing or shortness of breath. 8.5 g 10   cyclobenzaprine (FLEXERIL) 10 MG tablet TAKE 1 TABLET BY MOUTH TWICE A DAY AS NEEDED FOR MUSCLE SPASMS 60 tablet 2   famotidine (PEPCID) 20 MG tablet TAKE 1 TABLET BY MOUTH DAILY AFTER SUPPER 90 tablet 3   fluticasone (FLONASE) 50 MCG/ACT nasal spray SPRAY 2 SPRAYS INTO EACH NOSTRIL EVERY DAY 48 mL 2   pantoprazole (PROTONIX) 40 MG tablet TAKE 1 TABLET (40 MG TOTAL) BY MOUTH DAILY. TAKE 30-60 MIN BEFORE FIRST MEAL OF THE DAY 90 tablet 3   predniSONE (DELTASONE) 10 MG tablet TAKE 2 DAILY UNTIL BETTER THEN 1 DAILY X 5 DAYS AND STOP 100 tablet 1   Tiotropium Bromide-Olodaterol (STIOLTO RESPIMAT) 2.5-2.5 MCG/ACT AERS Inhale 2 puffs into the lungs daily. 4 g 0   Tiotropium Bromide-Olodaterol (STIOLTO RESPIMAT) 2.5-2.5 MCG/ACT AERS INHALE 2 PUFFS BY MOUTH INTO THE LUNGS DAILY 4 g 11   triamcinolone cream (KENALOG) 0.1 % APPLY TO AFFECTED AREA TWICE A DAY 30 g 0   valsartan-hydrochlorothiazide (DIOVAN HCT) 80-12.5 MG tablet Take 1 tablet by mouth daily. 30 tablet 11   No facility-administered medications prior to visit.    No Known Allergies  ROS Review of Systems  Constitutional:  Negative for chills and fever.  HENT:  Negative for sinus pressure, sinus pain and sore throat.   Eyes:  Negative for pain and discharge.  Respiratory:  Positive for cough, shortness of breath (chronic) and wheezing.   Cardiovascular:  Negative for chest pain and palpitations.  Gastrointestinal:  Negative for constipation, diarrhea, nausea and vomiting.  Endocrine: Negative for polydipsia and polyuria.  Genitourinary:  Negative for dysuria and hematuria.  Musculoskeletal:  Negative for neck pain and neck stiffness.  Skin:  Positive for rash (b/l UE).  Neurological:  Negative for dizziness, weakness, numbness and headaches.  Psychiatric/Behavioral:  Negative for  agitation and behavioral problems.      Objective:    Physical Exam Vitals reviewed.  Constitutional:      General: He is not in acute distress.    Appearance: He is not diaphoretic.  HENT:     Head: Normocephalic and atraumatic.     Nose:     Comments: Nasal mucosal erythema with some erosions in right nostril    Mouth/Throat:     Mouth: Mucous membranes are moist.  Eyes:     General: No scleral icterus.    Extraocular Movements: Extraocular movements intact.  Cardiovascular:     Rate and Rhythm: Normal rate and  regular rhythm.     Pulses: Normal pulses.     Heart sounds: Normal heart sounds. No murmur heard.   No friction rub. No gallop.  Pulmonary:     Effort: Pulmonary effort is normal. No respiratory distress.     Breath sounds: Wheezing (B/l, diffuse (mild)) present. No rales.  Abdominal:     General: Bowel sounds are normal.     Palpations: Abdomen is soft.     Tenderness: There is no abdominal tenderness.     Hernia: A hernia (Umbilical) is present.  Musculoskeletal:        General: No swelling or signs of injury.     Cervical back: Neck supple. No tenderness.     Right lower leg: No edema.     Left lower leg: No edema.  Skin:    General: Skin is warm.     Findings: Rash (Erythematous plaques over b/l UE) present.  Neurological:     General: No focal deficit present.     Mental Status: He is alert and oriented to person, place, and time.     Sensory: No sensory deficit.     Motor: No weakness.  Psychiatric:        Mood and Affect: Mood normal.        Behavior: Behavior normal.    BP 132/78 (BP Location: Right Arm, Patient Position: Sitting, Cuff Size: Normal)    Pulse 78    Resp 18    Ht '6\' 5"'  (1.956 m)    Wt 185 lb 1.3 oz (84 kg)    SpO2 97%    BMI 21.95 kg/m  Wt Readings from Last 3 Encounters:  02/04/22 185 lb 1.3 oz (84 kg)  12/16/21 183 lb (83 kg)  09/03/21 184 lb 1.9 oz (83.5 kg)    Lab Results  Component Value Date   TSH 2.060 06/25/2021    Lab Results  Component Value Date   WBC 13.2 (H) 08/03/2021   HGB 11.9 (L) 08/03/2021   HCT 38.0 (L) 08/03/2021   MCV 94.1 08/03/2021   PLT 258 08/03/2021   Lab Results  Component Value Date   NA 138 09/03/2021   K 5.2 09/03/2021   CO2 25 09/03/2021   GLUCOSE 113 (H) 09/03/2021   BUN 28 (H) 09/03/2021   CREATININE 2.30 (H) 09/03/2021   BILITOT 0.3 08/06/2021   ALKPHOS 57 08/06/2021   AST 14 08/06/2021   ALT 9 08/06/2021   PROT 6.8 08/06/2021   ALBUMIN 4.3 08/06/2021   CALCIUM 9.3 09/03/2021   ANIONGAP 10 08/03/2021   EGFR 30 (L) 09/03/2021   Lab Results  Component Value Date   CHOL 220 (H) 06/25/2021   Lab Results  Component Value Date   HDL 46 06/25/2021   Lab Results  Component Value Date   LDLCALC 139 (H) 06/25/2021   Lab Results  Component Value Date   TRIG 196 (H) 06/25/2021   Lab Results  Component Value Date   CHOLHDL 4.8 06/25/2021   Lab Results  Component Value Date   HGBA1C 6.2 (H) 06/25/2021      Assessment & Plan:   Problem List Items Addressed This Visit       Cardiovascular and Mediastinum   Essential hypertension - Primary    BP Readings from Last 1 Encounters:  02/04/22 132/78  Well-controlled with Valsartan-HCTZ Counseled for compliance with the medications Advised DASH diet and moderate exercise/walking as tolerated        Respiratory   COPD  GOLD IV/ Group D     Quit smoking in 2017 On Stiolto Albuterol nebs and inhaler PRN Follows up with Dr Melvyn Novas        Digestive   GERD (gastroesophageal reflux disease)    On Pantoprazole 40 mg QD Partly contributed by chronic steroid therapy        Genitourinary   Stage 3b chronic kidney disease ([place])    AKI on CKD in the past Some effect could be due to dehydration in the setting of diarrhea, which has resolved Was referred to ER, but he left AMA Not anuric currently Last BMP showed better Cr. with electrolytes wnl On ARB and HCTZ Avoid nephrotoxic agents Reviewed US  renal from ER Check BMP today      Relevant Orders   Basic Metabolic Panel (BMET)   Protein / creatinine ratio, urine     Other   Restless legs syndrome    His leg cramps could be due to restless legs syndrome Started Requip       Relevant Medications   rOPINIRole (REQUIP) 0.5 MG tablet    Meds ordered this encounter  Medications   rOPINIRole (REQUIP) 0.5 MG tablet    Sig: Take 1 tablet (0.5 mg total) by mouth at bedtime.    Dispense:  30 tablet    Refill:  2    Follow-up: Return in about 4 months (around 06/04/2022) for Annual physical.    Lindell Spar, MD

## 2022-02-04 NOTE — Assessment & Plan Note (Signed)
His leg cramps could be due to restless legs syndrome Started Requip

## 2022-02-04 NOTE — Assessment & Plan Note (Signed)
Quit smoking in 2017 On Stiolto Albuterol nebs and inhaler PRN Follows up with Dr Melvyn Novas

## 2022-02-04 NOTE — Patient Instructions (Signed)
Please start taking Ropinirole as prescribed for leg cramps/restless legs.  Please continue to take other medications as prescribed.  Please continue to follow low salt diet and increase fluid intake to at least 64 ounces of fluid in a day.

## 2022-02-04 NOTE — Assessment & Plan Note (Signed)
On Pantoprazole 40 mg QD Partly contributed by chronic steroid therapy

## 2022-02-04 NOTE — Assessment & Plan Note (Signed)
AKI on CKD in the past Some effect could be due to dehydration in the setting of diarrhea, which has resolved Was referred to ER, but he left AMA Not anuric currently Last BMP showed better Cr. with electrolytes wnl On ARB and HCTZ Avoid nephrotoxic agents Reviewed US renal from ER Check BMP today

## 2022-02-04 NOTE — Assessment & Plan Note (Signed)
BP Readings from Last 1 Encounters:  02/04/22 132/78   Well-controlled with Valsartan-HCTZ Counseled for compliance with the medications Advised DASH diet and moderate exercise/walking as tolerated

## 2022-02-05 ENCOUNTER — Telehealth: Payer: Self-pay | Admitting: Internal Medicine

## 2022-02-05 NOTE — Telephone Encounter (Signed)
Pt return call °

## 2022-02-06 LAB — BASIC METABOLIC PANEL
BUN/Creatinine Ratio: 14 (ref 10–24)
BUN: 26 mg/dL (ref 8–27)
CO2: 25 mmol/L (ref 20–29)
Calcium: 9.3 mg/dL (ref 8.6–10.2)
Chloride: 100 mmol/L (ref 96–106)
Creatinine, Ser: 1.89 mg/dL — ABNORMAL HIGH (ref 0.76–1.27)
Glucose: 95 mg/dL (ref 70–99)
Potassium: 5.5 mmol/L — ABNORMAL HIGH (ref 3.5–5.2)
Sodium: 138 mmol/L (ref 134–144)
eGFR: 38 mL/min/{1.73_m2} — ABNORMAL LOW (ref >59)

## 2022-02-06 LAB — PROTEIN / CREATININE RATIO, URINE
Creatinine, Urine: 65.7 mg/dL
Protein, Ur: 6.4 mg/dL
Protein/Creat Ratio: 97 mg/g creat (ref 0–200)

## 2022-02-08 NOTE — Telephone Encounter (Signed)
Pt advised of lab results with verbal understanding  

## 2022-02-09 ENCOUNTER — Other Ambulatory Visit: Payer: Self-pay | Admitting: Internal Medicine

## 2022-02-09 DIAGNOSIS — G2581 Restless legs syndrome: Secondary | ICD-10-CM

## 2022-03-05 DIAGNOSIS — I959 Hypotension, unspecified: Secondary | ICD-10-CM | POA: Diagnosis not present

## 2022-03-05 DIAGNOSIS — R5383 Other fatigue: Secondary | ICD-10-CM | POA: Diagnosis not present

## 2022-03-05 DIAGNOSIS — W57XXXA Bitten or stung by nonvenomous insect and other nonvenomous arthropods, initial encounter: Secondary | ICD-10-CM | POA: Diagnosis not present

## 2022-03-05 DIAGNOSIS — R1114 Bilious vomiting: Secondary | ICD-10-CM | POA: Diagnosis not present

## 2022-03-05 DIAGNOSIS — R051 Acute cough: Secondary | ICD-10-CM | POA: Diagnosis not present

## 2022-03-05 DIAGNOSIS — S1096XA Insect bite of unspecified part of neck, initial encounter: Secondary | ICD-10-CM | POA: Diagnosis not present

## 2022-03-05 DIAGNOSIS — R531 Weakness: Secondary | ICD-10-CM | POA: Diagnosis not present

## 2022-03-05 DIAGNOSIS — R5381 Other malaise: Secondary | ICD-10-CM | POA: Diagnosis not present

## 2022-03-06 DIAGNOSIS — R5383 Other fatigue: Secondary | ICD-10-CM | POA: Diagnosis not present

## 2022-03-06 DIAGNOSIS — E860 Dehydration: Secondary | ICD-10-CM | POA: Diagnosis not present

## 2022-03-06 DIAGNOSIS — S1096XD Insect bite of unspecified part of neck, subsequent encounter: Secondary | ICD-10-CM | POA: Diagnosis not present

## 2022-03-09 ENCOUNTER — Other Ambulatory Visit: Payer: Self-pay | Admitting: Internal Medicine

## 2022-03-11 ENCOUNTER — Encounter (HOSPITAL_COMMUNITY): Payer: Self-pay | Admitting: Emergency Medicine

## 2022-03-11 ENCOUNTER — Emergency Department (HOSPITAL_COMMUNITY): Payer: Medicare Other

## 2022-03-11 ENCOUNTER — Emergency Department (HOSPITAL_COMMUNITY)
Admission: EM | Admit: 2022-03-11 | Discharge: 2022-03-11 | Disposition: A | Discharge status: Home / Self Care | Payer: Medicare Other | Attending: Emergency Medicine | Admitting: Emergency Medicine

## 2022-03-11 DIAGNOSIS — J441 Chronic obstructive pulmonary disease with (acute) exacerbation: Secondary | ICD-10-CM | POA: Insufficient documentation

## 2022-03-11 DIAGNOSIS — Z20822 Contact with and (suspected) exposure to covid-19: Secondary | ICD-10-CM | POA: Diagnosis not present

## 2022-03-11 DIAGNOSIS — E860 Dehydration: Secondary | ICD-10-CM | POA: Diagnosis not present

## 2022-03-11 DIAGNOSIS — J449 Chronic obstructive pulmonary disease, unspecified: Secondary | ICD-10-CM | POA: Diagnosis not present

## 2022-03-11 DIAGNOSIS — J439 Emphysema, unspecified: Secondary | ICD-10-CM | POA: Diagnosis not present

## 2022-03-11 DIAGNOSIS — E878 Other disorders of electrolyte and fluid balance, not elsewhere classified: Secondary | ICD-10-CM | POA: Insufficient documentation

## 2022-03-11 DIAGNOSIS — E871 Hypo-osmolality and hyponatremia: Secondary | ICD-10-CM | POA: Diagnosis not present

## 2022-03-11 DIAGNOSIS — I10 Essential (primary) hypertension: Secondary | ICD-10-CM | POA: Diagnosis not present

## 2022-03-11 DIAGNOSIS — R110 Nausea: Secondary | ICD-10-CM | POA: Diagnosis not present

## 2022-03-11 DIAGNOSIS — Z87891 Personal history of nicotine dependence: Secondary | ICD-10-CM | POA: Diagnosis not present

## 2022-03-11 DIAGNOSIS — R6889 Other general symptoms and signs: Secondary | ICD-10-CM | POA: Diagnosis not present

## 2022-03-11 DIAGNOSIS — R5383 Other fatigue: Secondary | ICD-10-CM | POA: Diagnosis not present

## 2022-03-11 DIAGNOSIS — R0689 Other abnormalities of breathing: Secondary | ICD-10-CM | POA: Diagnosis not present

## 2022-03-11 DIAGNOSIS — R0902 Hypoxemia: Secondary | ICD-10-CM | POA: Diagnosis not present

## 2022-03-11 DIAGNOSIS — I959 Hypotension, unspecified: Secondary | ICD-10-CM | POA: Diagnosis not present

## 2022-03-11 DIAGNOSIS — Z743 Need for continuous supervision: Secondary | ICD-10-CM | POA: Diagnosis not present

## 2022-03-11 DIAGNOSIS — R0602 Shortness of breath: Secondary | ICD-10-CM | POA: Diagnosis not present

## 2022-03-11 DIAGNOSIS — D72829 Elevated white blood cell count, unspecified: Secondary | ICD-10-CM | POA: Insufficient documentation

## 2022-03-11 LAB — BASIC METABOLIC PANEL
Anion gap: 13 (ref 5–15)
BUN: 33 mg/dL — ABNORMAL HIGH (ref 8–23)
CO2: 22 mmol/L (ref 22–32)
Calcium: 8.3 mg/dL — ABNORMAL LOW (ref 8.9–10.3)
Chloride: 96 mmol/L — ABNORMAL LOW (ref 98–111)
Creatinine, Ser: 2.25 mg/dL — ABNORMAL HIGH (ref 0.61–1.24)
GFR, Estimated: 31 mL/min — ABNORMAL LOW (ref >60)
Glucose, Bld: 106 mg/dL — ABNORMAL HIGH (ref 70–99)
Potassium: 4.6 mmol/L (ref 3.5–5.1)
Sodium: 131 mmol/L — ABNORMAL LOW (ref 135–145)

## 2022-03-11 LAB — CBC WITH DIFFERENTIAL/PLATELET
Abs Immature Granulocytes: 0.14 10*3/uL — ABNORMAL HIGH (ref 0.00–0.07)
Basophils Absolute: 0.1 10*3/uL (ref 0.0–0.1)
Basophils Relative: 0 %
Eosinophils Absolute: 0 10*3/uL (ref 0.0–0.5)
Eosinophils Relative: 0 %
HCT: 32.4 % — ABNORMAL LOW (ref 39.0–52.0)
Hemoglobin: 10.3 g/dL — ABNORMAL LOW (ref 13.0–17.0)
Immature Granulocytes: 1 %
Lymphocytes Relative: 5 %
Lymphs Abs: 1 10*3/uL (ref 0.7–4.0)
MCH: 28.9 pg (ref 26.0–34.0)
MCHC: 31.8 g/dL (ref 30.0–36.0)
MCV: 90.8 fL (ref 80.0–100.0)
Monocytes Absolute: 1.2 10*3/uL — ABNORMAL HIGH (ref 0.1–1.0)
Monocytes Relative: 6 %
Neutro Abs: 16.8 10*3/uL — ABNORMAL HIGH (ref 1.7–7.7)
Neutrophils Relative %: 88 %
Platelets: 437 10*3/uL — ABNORMAL HIGH (ref 150–400)
RBC: 3.57 MIL/uL — ABNORMAL LOW (ref 4.22–5.81)
RDW: 12.7 % (ref 11.5–15.5)
WBC: 19.2 10*3/uL — ABNORMAL HIGH (ref 4.0–10.5)
nRBC: 0 % (ref 0.0–0.2)

## 2022-03-11 LAB — TROPONIN I (HIGH SENSITIVITY)
Troponin I (High Sensitivity): 6 ng/L (ref <18)
Troponin I (High Sensitivity): 5 ng/L (ref <18)

## 2022-03-11 LAB — LACTIC ACID, PLASMA
Lactic Acid, Venous: 1.9 mmol/L (ref 0.5–1.9)
Lactic Acid, Venous: 1.2 mmol/L (ref 0.5–1.9)

## 2022-03-11 LAB — RESP PANEL BY RT-PCR (FLU A&B, COVID) ARPGX2
Influenza A by PCR: NEGATIVE
Influenza B by PCR: NEGATIVE
SARS Coronavirus 2 by RT PCR: NEGATIVE

## 2022-03-11 MED ORDER — SODIUM CHLORIDE 0.9 % IV BOLUS: 1000.0000 mL | Freq: Once | INTRAVENOUS | Status: DC

## 2022-03-11 MED ORDER — METHYLPREDNISOLONE SODIUM SUCC 125 MG IJ SOLR
125.0000 mg | Freq: Once | INTRAMUSCULAR | Status: AC
  Administered 2022-03-11: 125 mg via INTRAVENOUS
  Filled 2022-03-11: qty 2

## 2022-03-11 MED ORDER — IPRATROPIUM-ALBUTEROL 0.5-2.5 (3) MG/3ML IN SOLN
3.0000 mL | Freq: Once | RESPIRATORY_TRACT | Status: AC
  Administered 2022-03-11: 3 mL via RESPIRATORY_TRACT
  Filled 2022-03-11: qty 3

## 2022-03-11 MED ORDER — AMOXICILLIN-POT CLAVULANATE 875-125 MG PO TABS: 1.0000 | ORAL_TABLET | Freq: Two times a day (BID) | ORAL | 0 refills | Status: DC

## 2022-03-11 NOTE — Discharge Instructions (Addendum)
Please call your primary care doctor tomorrow to get early follow-up for next week.  Return to the emergency department for any worsening symptoms like we discussed.  Please drink plenty of fluids and get plenty of rest. ?

## 2022-03-11 NOTE — ED Provider Notes (Signed)
?[facility] ?Provider Note ? ? ?CSN: [id number] ?Arrival date & time: 03/11/22  1413 ? ?  ? ?History ?Chief Complaint  ?Patient presents with  ? Shortness of Breath  ? ? ?Scott Hubbard is a 66 y.o. male with history of COPD who presents to the emergency department with shortness of breath and fatigue.  Patient was initially seen and evaluated at urgent care earlier today for similar symptoms.  He was ultimately sent to the emergency department for hypoxia.  On arrival, patient states he is feels very poorly and very fatigued.  He denies any chest pain, abdominal pain, nausea, vomiting currently.  No fever or chills.  No leg pain or leg swelling.  Patient is currently being treated for a tick bite with doxycycline.  He currently does not have any at home oxygen. ? ? ?Shortness of Breath ? ?  ? ?Home Medications ?Prior to Admission medications   ?Medication Sig Start Date End Date Taking? Authorizing Provider  ?acetaminophen (TYLENOL) 500 MG tablet Take 1,000 mg by mouth every 6 (six) hours as needed.   Yes [provider]  ?albuterol (VENTOLIN HFA) 108 (90 Base) MCG/ACT inhaler INHALE 2 PUFFS INTO THE LUNGS EVERY 4 HOURS AS NEEDED FOR WHEEZING OR SHORTNESS OF BREATH. 03/09/22  Yes Tanda Rockers, MD  ?doxycycline (VIBRAMYCIN) 100 MG capsule Take 100 mg by mouth 2 (two) times daily. 03/05/22  Yes [provider]  ?famotidine (PEPCID) 20 MG tablet TAKE 1 TABLET BY MOUTH DAILY AFTER SUPPER 11/26/21  Yes Tanda Rockers, MD  ?pantoprazole (PROTONIX) 40 MG tablet TAKE 1 TABLET (40 MG TOTAL) BY MOUTH DAILY. TAKE 30-60 MIN BEFORE FIRST MEAL OF THE DAY 01/19/21  Yes Tanda Rockers, MD  ?rOPINIRole (REQUIP) 0.5 MG tablet TAKE 1 TABLET BY MOUTH AT BEDTIME. 02/09/22  Yes Lindell Spar, MD  ?Tiotropium Bromide-Olodaterol (STIOLTO RESPIMAT) 2.5-2.5 MCG/ACT AERS Inhale 2 puffs into the lungs daily. 12/16/21  Yes Tanda Rockers, MD  ?valsartan-hydrochlorothiazide (DIOVAN HCT) 80-12.5 MG tablet  Take 1 tablet by mouth daily. 10/30/21  Yes Lindell Spar, MD  ?albuterol (PROVENTIL) (2.5 MG/3ML) 0.083% nebulizer solution Take 3 mLs (2.5 mg total) by nebulization every 4 (four) hours as needed for wheezing or shortness of breath. ?Patient not taking: Reported on 03/11/2022 10/09/20   Tanda Rockers, MD  ?cyclobenzaprine (FLEXERIL) 10 MG tablet TAKE 1 TABLET BY MOUTH TWICE A DAY AS NEEDED FOR MUSCLE SPASMS ?Patient not taking: Reported on 03/11/2022 01/11/22   Lindell Spar, MD  ?fluticasone (FLONASE) 50 MCG/ACT nasal spray SPRAY 2 SPRAYS INTO EACH NOSTRIL EVERY DAY ?Patient not taking: Reported on 03/11/2022 12/15/21   Lindell Spar, MD  ?predniSONE (DELTASONE) 10 MG tablet TAKE 2 DAILY UNTIL BETTER THEN 1 DAILY X 5 DAYS AND STOP ?Patient not taking: Reported on 03/11/2022 06/09/21   Tanda Rockers, MD  ?Tiotropium Bromide-Olodaterol (STIOLTO RESPIMAT) 2.5-2.5 MCG/ACT AERS INHALE 2 PUFFS BY MOUTH INTO THE LUNGS DAILY ?Patient not taking: Reported on 03/11/2022 01/11/22   Tanda Rockers, MD  ?triamcinolone cream (KENALOG) 0.1 % APPLY TO AFFECTED AREA TWICE A DAY ?Patient not taking: Reported on 03/11/2022 08/25/21   Lindell Spar, MD  ?   ? ?Allergies    ?Patient has no known allergies.   ? ?Review of Systems   ?Review of Systems  ?Respiratory:  Positive for shortness of breath.   ?All other systems reviewed and are negative. ? ?Physical Exam ?Updated Vital Signs ?BP  129/90   Pulse 69   Resp 17   SpO2 96%  ?Physical Exam ?Vitals and nursing note reviewed.  ?Constitutional:   ?   General: He is not in acute distress. ?   Appearance: Normal appearance.  ?HENT:  ?   Head: Normocephalic and atraumatic.  ?Eyes:  ?   General:     ?   Right eye: No discharge.     ?   Left eye: No discharge.  ?Cardiovascular:  ?   Comments: Bradycardic with abnormal rhythm.  S1/S2 are distinct without any evidence of murmur, rubs, or gallops.  Radial pulses are 2+ bilaterally.  Dorsalis pedis pulses are 2+ bilaterally.  No evidence  of pedal edema. ?Pulmonary:  ?   Comments: Expiratory wheeze and rhonchi heard throughout the precordium.  Mild respiratory distress ?Abdominal:  ?   General: Abdomen is flat. Bowel sounds are normal. There is no distension.  ?   Tenderness: There is no abdominal tenderness. There is no guarding or rebound.  ?Musculoskeletal:     ?   General: Normal range of motion.  ?   Cervical back: Neck supple.  ?Skin: ?   General: Skin is warm and dry.  ?   Findings: No rash.  ?Neurological:  ?   General: No focal deficit present.  ?   Mental Status: He is alert.  ?Psychiatric:     ?   Mood and Affect: Mood normal.     ?   Behavior: Behavior normal.  ? ? ?ED Results / Procedures / Treatments   ?Labs ?(all labs ordered are listed, but only abnormal results are displayed) ?Labs Reviewed  ?BASIC METABOLIC PANEL - Abnormal; Notable for the following components:  ?    Result Value  ? Sodium 131 (*)   ? Chloride 96 (*)   ? Glucose, Bld 106 (*)   ? BUN 33 (*)   ? Creatinine, Ser 2.25 (*)   ? Calcium 8.3 (*)   ? GFR, Estimated 31 (*)   ? All other components within normal limits  ?CBC WITH DIFFERENTIAL/PLATELET - Abnormal; Notable for the following components:  ? WBC 19.2 (*)   ? RBC 3.57 (*)   ? Hemoglobin 10.3 (*)   ? HCT 32.4 (*)   ? Platelets 437 (*)   ? Neutro Abs 16.8 (*)   ? Monocytes Absolute 1.2 (*)   ? Abs Immature Granulocytes 0.14 (*)   ? All other components within normal limits  ?RESP PANEL BY RT-PCR (FLU A&B, COVID) [id number]  ?LACTIC ACID, PLASMA  ?LACTIC ACID, PLASMA  ?TROPONIN I (HIGH SENSITIVITY)  ?TROPONIN I (HIGH SENSITIVITY)  ? ? ?EKG ?None ? ?Radiology ?DG Chest Portable 1 View ? ?Result Date: 03/11/2022 ?CLINICAL DATA:  Shortness of breath. EXAM: PORTABLE CHEST 1 VIEW COMPARISON:  Chest x-ray dated May 05, 2021. FINDINGS: The heart size and mediastinal contours are within normal limits. Normal pulmonary vascularity. The lungs remain hyperinflated with emphysematous changes and chronically coarsened interstitial  markings. No focal consolidation, pleural effusion, or pneumothorax. No acute osseous abnormality. IMPRESSION: 1. COPD. No active disease. Electronically Signed   By: Titus Dubin M.D.   On: 03/11/2022 14:53   ? ?Procedures ?Procedures  ? ? ?Medications Ordered in ED ?Medications  ?ipratropium-albuterol (DUONEB) 0.5-2.5 (3) MG/3ML nebulizer solution 3 mL (3 mLs Nebulization Given 03/11/22 1518)  ?methylPREDNISolone sodium succinate (SOLU-MEDROL) 125 mg/2 mL injection 125 mg (125 mg Intravenous Given 03/11/22 1557)  ?sodium chloride 0.9 % bolus 1,000 mL (1,000  mLs Intravenous Bolus 03/11/22 1756)  ? ? ?ED Course/ Medical Decision Making/ A&P ?Clinical Course as of 03/11/22 1850  ?Thu Mar 11, 2022  ?1831 On repeat evaluation, liter of fluid has run.  Patient is feeling better.  Blood pressure has improved even further.  Shared decision-making was done at the bedside whether or not the patient would like to go home.  I discussed the findings of the lab results and chest x-ray findings.  Patient would like to go home and this is appropriate.  We discussed strict return precautions at the bedside with him and his significant other.  He is going to give his primary care doctor a call tomorrow to schedule an appointment for early next week.  If he does not get better over the weekend or worsens he will come back to the emergency department.  I will also add on a second antibiotic for broadened coverage as patient is already taking doxycycline. [CF]  ?  ?Clinical Course User Index ?[CF] Myna Bright M, PA-C  ? ?                        ?Medical Decision Making ?Amount and/or Complexity of Data Reviewed ?Labs: ordered. ?Radiology: ordered. ? ?Risk ?Prescription drug management. ? ? ?This patient presents to the ED for concern of shortness of breath and dehydration, this involves an extensive number of treatment options, and is a complaint that carries with it a high risk of complications and morbidity.  The differential  diagnosis includes COPD exacerbation, ACS, pneumonia, pneumothorax, dysrhythmia, dehydration. ? ? ?Co morbidities that complicate the patient evaluation ? ?Past Medical History:  ?Diagnosis Date  ? Chest disco

## 2022-03-11 NOTE — ED Triage Notes (Signed)
Pt arrived via [facility] from Urgent Care c/o hypoxia and low oxygen sat. Per EMS, pt BP 92/60. Hx of COPD ?

## 2022-04-13 ENCOUNTER — Telehealth: Payer: Self-pay | Admitting: Internal Medicine

## 2022-04-13 ENCOUNTER — Other Ambulatory Visit: Payer: Self-pay | Admitting: *Deleted

## 2022-04-13 MED ORDER — PANTOPRAZOLE SODIUM 40 MG PO TBEC: 40.0000 mg | DELAYED_RELEASE_TABLET | Freq: Every day | ORAL | 3 refills | Status: DC

## 2022-04-13 NOTE — Telephone Encounter (Signed)
Called and spoke to pharmacist at [facility]. She states protonix was refilled earlier today. Called and notified patient and he voiced understanding. Nothing further needed.  ?

## 2022-06-09 ENCOUNTER — Encounter: Payer: Self-pay | Admitting: Internal Medicine

## 2022-06-09 ENCOUNTER — Ambulatory Visit (INDEPENDENT_AMBULATORY_CARE_PROVIDER_SITE_OTHER): Payer: Medicare Other | Admitting: Internal Medicine

## 2022-06-09 VITALS — BP 128/74 | HR 64 | Ht 77.0 in | Wt 176.0 lb

## 2022-06-09 VITALS — BP 128/74 | HR 66 | Resp 18 | Ht 77.0 in | Wt 176.8 lb

## 2022-06-09 DIAGNOSIS — J449 Chronic obstructive pulmonary disease, unspecified: Secondary | ICD-10-CM

## 2022-06-09 DIAGNOSIS — E559 Vitamin D deficiency, unspecified: Secondary | ICD-10-CM

## 2022-06-09 DIAGNOSIS — G2581 Restless legs syndrome: Secondary | ICD-10-CM | POA: Diagnosis not present

## 2022-06-09 DIAGNOSIS — E785 Hyperlipidemia, unspecified: Secondary | ICD-10-CM | POA: Diagnosis not present

## 2022-06-09 DIAGNOSIS — Z0001 Encounter for general adult medical examination with abnormal findings: Secondary | ICD-10-CM | POA: Insufficient documentation

## 2022-06-09 DIAGNOSIS — I209 Angina pectoris, unspecified: Secondary | ICD-10-CM | POA: Diagnosis not present

## 2022-06-09 DIAGNOSIS — I10 Essential (primary) hypertension: Secondary | ICD-10-CM | POA: Diagnosis not present

## 2022-06-09 DIAGNOSIS — R0609 Other forms of dyspnea: Secondary | ICD-10-CM

## 2022-06-09 DIAGNOSIS — Z720 Tobacco use: Secondary | ICD-10-CM | POA: Diagnosis not present

## 2022-06-09 DIAGNOSIS — N1832 Chronic kidney disease, stage 3b: Secondary | ICD-10-CM

## 2022-06-09 DIAGNOSIS — R0602 Shortness of breath: Secondary | ICD-10-CM | POA: Diagnosis not present

## 2022-06-09 DIAGNOSIS — R7303 Prediabetes: Secondary | ICD-10-CM | POA: Diagnosis not present

## 2022-06-09 DIAGNOSIS — Z125 Encounter for screening for malignant neoplasm of prostate: Secondary | ICD-10-CM

## 2022-06-09 MED ORDER — FAMOTIDINE 20 MG PO TABS: ORAL_TABLET | ORAL | Status: DC

## 2022-06-09 MED ORDER — PREDNISONE 10 MG PO TABS: ORAL_TABLET | ORAL | 0 refills | Status: DC

## 2022-06-09 MED ORDER — ROPINIROLE HCL 1 MG PO TABS: 1.0000 mg | ORAL_TABLET | Freq: Every day | ORAL | 1 refills | Status: DC

## 2022-06-09 NOTE — Progress Notes (Signed)
Established Patient Office Visit  Subjective:  Patient ID: Scott Hubbard, male    DOB: [birth year]  Age: 66 y.o. MRN: [id number]  CC:  Chief Complaint  Patient presents with   Annual Exam    Annual exam pt has been having leg cramps also sob when doing anything but sees pulmonary today     HPI Scott Hubbard is a 66 y.o. male with past medical history of COPD, hypertension, CKD and previous tobacco abuse who presents for annual physical.  He complains of chronic leg cramps, for which he has used Flexeril.  He also reports urge to move his legs at nighttime.   HTN: BP is well-controlled. Takes medications regularly. Patient denies headache, dizziness, or palpitations.   COPD: He complains of chronic cough and dyspnea, for which he uses Stiolto and as needed albuterol neb and inhaler.  Followed by Dr. Melvyn Novas.  He is not on oral steroids now.  He c/o episodes of chest pain with exertion and dyspnea. His chest pain is substernal, pressure like, nonradiating. Denies any episode of dizziness or syncope.  Past Medical History:  Diagnosis Date   Chest discomfort    negative stress echocardiogram-01/2011   Chronic bronchitis    COPD (chronic obstructive pulmonary disease) (HCC)    DJD (degenerative joint disease)    Gastro-esophageal reflux disease with esophagitis    GERD (gastroesophageal reflux disease)    Hyperlipidemia    Hypertension    Migraine    PVC (premature ventricular contraction)    Frequent   Tobacco abuse    40 pack years    Past Surgical History:  Procedure Laterality Date   None      History reviewed. No pertinent family history.  Social History   Socioeconomic History   Marital status: Legally Separated    Spouse name: Not on file   Number of children: 2   Years of education: Not on file   Highest education level: Not on file  Occupational History   Occupation: unemployed    Employer: UNEMPLOYED  Tobacco Use   Smoking status: Former    Packs/day:  1.50    Years: 40.00    Total pack years: 60.00    Types: Cigarettes    Quit date: 04/27/2016    Years since quitting: 6.1   Smokeless tobacco: Never  Vaping Use   Vaping Use: Never used  Substance and Sexual Activity   Alcohol use: No   Drug use: No   Sexual activity: Not on file  Other Topics Concern   Not on file  Social History Narrative   Lives with girlfriend.  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     Social Determinants of Health   Financial Resource Strain: Low Risk  (09/01/2021)   Overall Financial Resource Strain (CARDIA)    Difficulty of Paying Living Expenses: Not very hard  Food Insecurity: No Food Insecurity (09/01/2021)   Hunger Vital Sign    Worried About Running Out of Food in the Last Year: Never true    Ran Out of Food in the Last Year: Never true  Transportation Needs: No Transportation Needs (09/01/2021)   PRAPARE - Hydrologist (Medical): No    Lack of Transportation (Non-Medical): No  Physical Activity: Inactive (09/01/2021)   Exercise Vital Sign    Days of Exercise per Week: 0 days    Minutes of Exercise per Session: 0 min  Stress: No Stress Concern Present (09/01/2021)   [employer]  of Occupational [employer] - Occupational Stress Questionnaire    Feeling of Stress : Not at all  Social Connections: Moderately Integrated (09/01/2021)   Social Connection and Isolation Panel [NHANES]    Frequency of Communication with Friends and Family: More than three times a week    Frequency of Social Gatherings with Friends and Family: Never    Attends Religious Services: Never    Marine scientist or Organizations: Yes    Attends Archivist Meetings: Never    Marital Status: Living with partner  Intimate Partner Violence: Not At Risk (09/01/2021)   Humiliation, Afraid, Rape, and Kick questionnaire    Fear of Current or Ex-Partner: No    Emotionally Abused: No    Physically Abused: No    Sexually Abused: No    Outpatient  Medications Prior to Visit  Medication Sig Dispense Refill   acetaminophen (TYLENOL) 500 MG tablet Take 1,000 mg by mouth every 6 (six) hours as needed.     albuterol (PROVENTIL) (2.5 MG/3ML) 0.083% nebulizer solution Take 3 mLs (2.5 mg total) by nebulization every 4 (four) hours as needed for wheezing or shortness of breath. 75 mL 12   albuterol (VENTOLIN HFA) 108 (90 Base) MCG/ACT inhaler INHALE 2 PUFFS INTO THE LUNGS EVERY 4 HOURS AS NEEDED FOR WHEEZING OR SHORTNESS OF BREATH. 8.5 each 10   cyclobenzaprine (FLEXERIL) 10 MG tablet TAKE 1 TABLET BY MOUTH TWICE A DAY AS NEEDED FOR MUSCLE SPASMS 60 tablet 2   doxycycline (VIBRAMYCIN) 100 MG capsule Take 100 mg by mouth 2 (two) times daily.     famotidine (PEPCID) 20 MG tablet TAKE 1 TABLET BY MOUTH DAILY AFTER SUPPER 90 tablet 3   fluticasone (FLONASE) 50 MCG/ACT nasal spray SPRAY 2 SPRAYS INTO EACH NOSTRIL EVERY DAY 48 mL 2   pantoprazole (PROTONIX) 40 MG tablet Take 1 tablet (40 mg total) by mouth daily. Take 30-60 min before first meal of the day 90 tablet 3   Tiotropium Bromide-Olodaterol (STIOLTO RESPIMAT) 2.5-2.5 MCG/ACT AERS INHALE 2 PUFFS BY MOUTH INTO THE LUNGS DAILY 4 g 11   triamcinolone cream (KENALOG) 0.1 % APPLY TO AFFECTED AREA TWICE A DAY 30 g 0   valsartan-hydrochlorothiazide (DIOVAN HCT) 80-12.5 MG tablet Take 1 tablet by mouth daily. 30 tablet 11   amoxicillin-clavulanate (AUGMENTIN) 875-125 MG tablet Take 1 tablet by mouth every 12 (twelve) hours. 14 tablet 0   rOPINIRole (REQUIP) 0.5 MG tablet TAKE 1 TABLET BY MOUTH AT BEDTIME. 90 tablet 0   Tiotropium Bromide-Olodaterol (STIOLTO RESPIMAT) 2.5-2.5 MCG/ACT AERS Inhale 2 puffs into the lungs daily. 4 g 0   predniSONE (DELTASONE) 10 MG tablet TAKE 2 DAILY UNTIL BETTER THEN 1 DAILY X 5 DAYS AND STOP (Patient not taking: Reported on 06/09/2022) 100 tablet 1   No facility-administered medications prior to visit.    No Known Allergies  ROS Review of Systems  Constitutional:   Negative for chills and fever.  HENT:  Negative for sinus pressure, sinus pain and sore throat.   Eyes:  Negative for pain and discharge.  Respiratory:  Positive for cough, shortness of breath (chronic) and wheezing.   Cardiovascular:  Positive for chest pain. Negative for palpitations.  Gastrointestinal:  Negative for constipation, diarrhea, nausea and vomiting.  Endocrine: Negative for polydipsia and polyuria.  Genitourinary:  Negative for dysuria and hematuria.  Musculoskeletal:  Negative for neck pain and neck stiffness.  Skin:  Positive for rash (b/l UE).  Neurological:  Negative for dizziness, weakness, numbness  and headaches.  Psychiatric/Behavioral:  Negative for agitation and behavioral problems.       Objective:    Physical Exam Vitals reviewed.  Constitutional:      General: He is not in acute distress.    Appearance: He is not diaphoretic.  HENT:     Head: Normocephalic and atraumatic.     Mouth/Throat:     Mouth: Mucous membranes are moist.  Eyes:     General: No scleral icterus.    Extraocular Movements: Extraocular movements intact.  Cardiovascular:     Rate and Rhythm: Normal rate and regular rhythm.     Pulses: Normal pulses.     Heart sounds: Normal heart sounds. No murmur heard.    No friction rub. No gallop.  Pulmonary:     Effort: Pulmonary effort is normal. No respiratory distress.     Breath sounds: Wheezing (B/l, diffuse (mild)) present. No rales.  Abdominal:     General: Bowel sounds are normal.     Palpations: Abdomen is soft.     Tenderness: There is no abdominal tenderness.     Hernia: A hernia (Umbilical) is present.  Musculoskeletal:        General: No swelling or signs of injury.     Cervical back: Neck supple. No tenderness.     Right lower leg: No edema.     Left lower leg: No edema.  Skin:    General: Skin is warm.     Findings: Rash (Erythematous plaques over b/l UE) present.  Neurological:     General: No focal deficit present.      Mental Status: He is alert and oriented to person, place, and time.     Sensory: No sensory deficit.     Motor: No weakness.  Psychiatric:        Mood and Affect: Mood normal.        Behavior: Behavior normal.     BP 128/74 (BP Location: Right Arm, Patient Position: Sitting, Cuff Size: Normal)   Pulse 66   Resp 18   Ht 6' 5" (1.956 m)   Wt 176 lb 12.8 oz (80.2 kg)   SpO2 (!) 84%   BMI 20.97 kg/m  Wt Readings from Last 3 Encounters:  06/09/22 176 lb 12.8 oz (80.2 kg)  02/04/22 185 lb 1.3 oz (84 kg)  12/16/21 183 lb (83 kg)    Lab Results  Component Value Date   TSH 2.060 06/25/2021   Lab Results  Component Value Date   WBC 19.2 (H) 03/11/2022   HGB 10.3 (L) 03/11/2022   HCT 32.4 (L) 03/11/2022   MCV 90.8 03/11/2022   PLT 437 (H) 03/11/2022   Lab Results  Component Value Date   NA 131 (L) 03/11/2022   K 4.6 03/11/2022   CO2 22 03/11/2022   GLUCOSE 106 (H) 03/11/2022   BUN 33 (H) 03/11/2022   CREATININE 2.25 (H) 03/11/2022   BILITOT 0.3 08/06/2021   ALKPHOS 57 08/06/2021   AST 14 08/06/2021   ALT 9 08/06/2021   PROT 6.8 08/06/2021   ALBUMIN 4.3 08/06/2021   CALCIUM 8.3 (L) 03/11/2022   ANIONGAP 13 03/11/2022   EGFR 38 (L) 02/04/2022   Lab Results  Component Value Date   CHOL 220 (H) 06/25/2021   Lab Results  Component Value Date   HDL 46 06/25/2021   Lab Results  Component Value Date   LDLCALC 139 (H) 06/25/2021   Lab Results  Component Value Date   TRIG 196 (  H) 06/25/2021   Lab Results  Component Value Date   CHOLHDL 4.8 06/25/2021   Lab Results  Component Value Date   HGBA1C 6.2 (H) 06/25/2021      Assessment & Plan:   Problem List Items Addressed This Visit       Cardiovascular and Mediastinum   Angina pectoris ([place])    Has anginal chest pain and dyspnea with exertion EKG: Sinus rhythm. PACs noted. No signs of active ischemia. Referred to Cardiology      Relevant Orders   EKG 12-Lead (Completed)   Ambulatory referral to  Cardiology     Respiratory   COPD GOLD IV/ Group D     Quit smoking in 2017 On Stiolto Albuterol nebs and inhaler PRN Follows up with Dr Melvyn Novas        Genitourinary   Stage 3b chronic kidney disease ([place])    AKI on CKD in the past Some effect could be due to dehydration in the setting of diarrhea, which has resolved On ARB and HCTZ Avoid nephrotoxic agents Reviewed US renal from ER Check CMP today        Other   Restless legs syndrome    Uncontrolled with Requip 0.5 mg qHS Increased dose of Requip to 1 mg qHS       Relevant Medications   rOPINIRole (REQUIP) 1 MG tablet   Encounter for general adult medical examination with abnormal findings - Primary    Physical exam as documented. Fasting blood tests today. Refused Pneumococcal vaccine.      Relevant Orders   TSH   Lipid panel   Hemoglobin A1c   CMP14+EGFR   CBC with Differential/Platelet   Prostate cancer screening    Ordered PSA after discussing its limitations for prostate cancer screening, including false positive results leading additional investigations.      Relevant Orders   PSA   Other Visit Diagnoses     Vitamin D deficiency       Relevant Orders   VITAMIN D 25 Hydroxy (Vit-D Deficiency, Fractures)   SOB (shortness of breath) on exertion       Relevant Orders   EKG 12-Lead (Completed)       Meds ordered this encounter  Medications   rOPINIRole (REQUIP) 1 MG tablet    Sig: Take 1 tablet (1 mg total) by mouth at bedtime.    Dispense:  90 tablet    Refill:  1    Follow-up: Return in about 4 months (around 10/09/2022) for CKD and COPD.    Lindell Spar, MD

## 2022-06-09 NOTE — Patient Instructions (Addendum)
Plan A = Automatic = Always=    Stiolto 2 pffs 1st thing AM  and chase with flovent 2 puffs/ repeat the flovent 12 hours later   Work on inhaler technique:  relax and gently blow all the way out then take a nice smooth full deep breath back in, triggering the inhaler at same time you start breathing in.  Hold for up to 5 seconds if you can. Blow out thru nose. Rinse and gargle with water when done.  If mouth or throat bother you at all,  try brushing teeth/gums/tongue with arm and hammer toothpaste/ make a slurry and gargle and spit out.       Plan B = Backup (to supplement plan A, not to replace it) Only use your albuterol inhaler as a rescue medication to be used if you can't catch your breath by resting or doing a relaxed purse lip breathing pattern.  - The less you use it, the better it will work when you need it. - Ok to use the inhaler up to 2 puffs  every 4 hours if you must but call for appointment if use goes up over your usual need - Don't leave home without it !!  (think of it like the spare tire for your car)   Plan C = Crisis (instead of Plan B but only if Plan B stops working) - only use your albuterol nebulizer if you first try Plan B and it fails to help > ok to use the nebulizer up to every 4 hours but if start needing it regularly call for immediate appointment  Be sure you are taking pepcid (famotidine) 20 mg after bfast and after supper   Prednisone 10 mg take  4 each am x 2 days,   2 each am x 2 days,  1 each am x 2 days and stop   Please remember to go to the  x-ray department  @  [facility] for your tests - we will call you with the results when they are available     Please schedule a follow up office visit in 6 weeks, call sooner if needed with all medications /inhalers/ solutions in hand so we can verify exactly what you are taking. This includes all medications from all doctors and over the counters

## 2022-06-09 NOTE — Assessment & Plan Note (Signed)
Ordered PSA after discussing its limitations for prostate cancer screening, including false positive results leading additional investigations. 

## 2022-06-09 NOTE — Assessment & Plan Note (Signed)
Quit smoking in 2017 On Stiolto Albuterol nebs and inhaler PRN Follows up with Dr Melvyn Novas

## 2022-06-09 NOTE — Patient Instructions (Signed)
Please start taking Ropinirole 1 mg instead of 0.5 mg.  Please take Magnesium oxide 200 mg once daily.  Please continue taking other medications as prescribed.

## 2022-06-09 NOTE — Progress Notes (Unsigned)
Scott Hubbard, male    DOB: [birth year]    MRN: [id number]   Brief patient profile:  68 yowm MM/quit smoking 04/2016 @ onset of severe spell of sob and since then doe gradually worse despite rx with flovent/ stiolto so referred to pulmonary clinic 04/09/2020 by Dr  [name]     History of Present Illness  04/09/2020  Pulmonary/ 1st office eval/Marveen Donlon  Chief Complaint  Patient presents with   Pulmonary Consult    Referred by Dr. [name] for eval of COPD. Former pt of Dr [name]. He states he gets SOB "when I do anything".    Dyspnea:  [employer] x 2 aisles but walks "faster than other people" (walked very slowly in office) so MMRC3 = can't walk 100 yards even at a slow pace at a flat grade s stopping due to sob    Last time mb and back was prior to prior to when he quit smoking as has long driveway with incline on way back  Cough: none Sleep: flat bed / one pillow L side s respiratory symptoms  SABA use: 2 pffs each am / does not know how when to use hfa vs neb     rec Plan A = Automatic = Always=    Stiolto x 2 pffs/ flovent x 2puffs  first thing in am and repeat flovent in pm  Prednisone 10 mg x 2 daily until breathing better then 1 daily x a week and stop (#60)     Take valsartan 40 mg x 2 pills daily until you see your cardiologist            Plan B = Backup (to supplement plan A, not to replace it) Only use your albuterol inhaler as a rescue medication   12/16/2021  f/u ov/[place] office/Kyleena Scheirer re: GOLD 4 copd  maint on stiolto/ no recent pred/ never went to rehab - can't afford   Chief Complaint  Patient presents with   Follow-up    Coughing and wheezing are about the same since last OV   Dyspnea:  able to push the buggy at [place] / haul wood Cough: not much production but still rattles  Sleeping: flat bed ok SABA use: less  02: none  Covid status: vax x 4  LUQ cp can  last a couple of secs/ couple min not pleuritic or ex Rec No change rx    06/09/2022  f/u ov/[place]  office/Zitlaly Malson re: copd gold 4 maint on stiolto worse x several months  Chief Complaint  Patient presents with   Follow-up    Feels breathing has ben worse since last visit. No longer wheezing and not coughing as much   Dyspnea:  now rides scooter at [employer] Cough: none  Sleeping: flat bed/ one pillow  SABA use: hfa last used 3.5 h pta  5-6 x per day  02: none  Lung cancer screening: declined    No obvious day to day or daytime variability or assoc excess/ purulent sputum or mucus plugs or hemoptysis or cp or chest tightness, subjective wheeze or overt sinus or hb symptoms.   *** without nocturnal  or early am exacerbation  of respiratory  c/o's or need for noct saba. Also denies any obvious fluctuation of symptoms with weather or environmental changes or other aggravating or alleviating factors except as outlined above   No unusual exposure hx or h/o childhood pna/ asthma or knowledge of premature birth.  Current Allergies, Complete Past Medical History, Past Surgical  History, Family History, and Social History were reviewed in [employer] record.  ROS  The following are not active complaints unless bolded Hoarseness, sore throat, dysphagia, dental problems, itching, sneezing,  nasal congestion or discharge of excess mucus or purulent secretions, ear ache,   fever, chills, sweats, unintended wt loss or wt gain, classically pleuritic or exertional cp,  orthopnea pnd or arm/hand swelling  or leg swelling, presyncope, palpitations, abdominal pain, anorexia, nausea, vomiting, diarrhea  or change in bowel habits or change in bladder habits, change in stools or change in urine, dysuria, hematuria,  rash, arthralgias, visual complaints, headache, numbness, weakness or ataxia or problems with walking or coordination,  change in mood or  memory.        Current Meds  Medication Sig   acetaminophen (TYLENOL) 500 MG tablet Take 1,000 mg by mouth every 6 (six) hours as needed.    albuterol (PROVENTIL) (2.5 MG/3ML) 0.083% nebulizer solution Take 3 mLs (2.5 mg total) by nebulization every 4 (four) hours as needed for wheezing or shortness of breath.   albuterol (VENTOLIN HFA) 108 (90 Base) MCG/ACT inhaler INHALE 2 PUFFS INTO THE LUNGS EVERY 4 HOURS AS NEEDED FOR WHEEZING OR SHORTNESS OF BREATH.   cyclobenzaprine (FLEXERIL) 10 MG tablet TAKE 1 TABLET BY MOUTH TWICE A DAY AS NEEDED FOR MUSCLE SPASMS   fluticasone (FLONASE) 50 MCG/ACT nasal spray SPRAY 2 SPRAYS INTO EACH NOSTRIL EVERY DAY   rOPINIRole (REQUIP) 1 MG tablet Take 1 tablet (1 mg total) by mouth at bedtime.   Tiotropium Bromide-Olodaterol (STIOLTO RESPIMAT) 2.5-2.5 MCG/ACT AERS INHALE 2 PUFFS BY MOUTH INTO THE LUNGS DAILY   triamcinolone cream (KENALOG) 0.1 % APPLY TO AFFECTED AREA TWICE A DAY   valsartan-hydrochlorothiazide (DIOVAN HCT) 80-12.5 MG tablet Take 1 tablet by mouth daily.                                                    Past Medical History:  Diagnosis Date   Chest discomfort    negative stress echocardiogram-01/2011   Chronic bronchitis    COPD (chronic obstructive pulmonary disease) (HCC)    DJD (degenerative joint disease)    Dyspnea    Gastro-esophageal reflux disease with esophagitis    GERD (gastroesophageal reflux disease)    Hyperlipidemia    Hypertension    Migraine    PVC (premature ventricular contraction)    Frequent   Tobacco abuse    Tobacco abuse    40 pack years       Objective:    12/16/2021      183  05/05/2021        192  01/28/2021         203 11/06/2020      200  09/08/2020       195   05/09/20 197 lb (89.4 kg)  04/30/20 197 lb (89.4 kg)  04/09/20 197 lb (89.4 kg)    Vital signs reviewed  06/09/2022  - Note at rest 02 sats  ***% on ***   General appearance:    ***    Edentulous Mod bar***     I personally reviewed images and agree with radiology impression as follows:  CXR:   portable 03/11/22  COPD. No active disease. Assessment

## 2022-06-09 NOTE — Assessment & Plan Note (Signed)
AKI on CKD in the past Some effect could be due to dehydration in the setting of diarrhea, which has resolved On ARB and HCTZ Avoid nephrotoxic agents Reviewed US renal from ER Check CMP today

## 2022-06-09 NOTE — Assessment & Plan Note (Signed)
Uncontrolled with Requip 0.5 mg qHS Increased dose of Requip to 1 mg qHS

## 2022-06-09 NOTE — Assessment & Plan Note (Signed)
Physical exam as documented. Fasting blood tests today. Refused Pneumococcal vaccine.

## 2022-06-09 NOTE — Assessment & Plan Note (Signed)
Has anginal chest pain and dyspnea with exertion EKG: Sinus rhythm. PACs noted. No signs of active ischemia. Referred to Cardiology

## 2022-06-10 ENCOUNTER — Ambulatory Visit (HOSPITAL_COMMUNITY)
Admission: RE | Admit: 2022-06-10 | Discharge: 2022-06-10 | Disposition: A | Discharge status: Home / Self Care | Payer: Medicare Other | Source: Ambulatory Visit | Attending: Internal Medicine | Admitting: Internal Medicine

## 2022-06-10 DIAGNOSIS — J449 Chronic obstructive pulmonary disease, unspecified: Secondary | ICD-10-CM

## 2022-06-10 DIAGNOSIS — R0602 Shortness of breath: Secondary | ICD-10-CM | POA: Diagnosis not present

## 2022-06-10 LAB — LIPID PANEL
Chol/HDL Ratio: 4.4 ratio (ref 0.0–5.0)
Cholesterol, Total: 154 mg/dL (ref 100–199)
HDL: 35 mg/dL — ABNORMAL LOW (ref >39)
LDL Chol Calc (NIH): 103 mg/dL — ABNORMAL HIGH (ref 0–99)
Triglycerides: 84 mg/dL (ref 0–149)
VLDL Cholesterol Cal: 16 mg/dL (ref 5–40)

## 2022-06-10 LAB — CBC WITH DIFFERENTIAL/PLATELET
Basophils Absolute: 0 10*3/uL (ref 0.0–0.2)
Basos: 0 %
EOS (ABSOLUTE): 0 10*3/uL (ref 0.0–0.4)
Eos: 0 %
Hematocrit: 40 % (ref 37.5–51.0)
Hemoglobin: 13.2 g/dL (ref 13.0–17.7)
Immature Grans (Abs): 0.1 10*3/uL (ref 0.0–0.1)
Immature Granulocytes: 1 %
Lymphocytes Absolute: 1.3 10*3/uL (ref 0.7–3.1)
Lymphs: 11 %
MCH: 28.9 pg (ref 26.6–33.0)
MCHC: 33 g/dL (ref 31.5–35.7)
MCV: 88 fL (ref 79–97)
Monocytes Absolute: 0.8 10*3/uL (ref 0.1–0.9)
Monocytes: 7 %
Neutrophils Absolute: 9.4 10*3/uL — ABNORMAL HIGH (ref 1.4–7.0)
Neutrophils: 81 %
Platelets: 190 10*3/uL (ref 150–450)
RBC: 4.56 x10E6/uL (ref 4.14–5.80)
RDW: 13.7 % (ref 11.6–15.4)
WBC: 11.7 10*3/uL — ABNORMAL HIGH (ref 3.4–10.8)

## 2022-06-10 LAB — CMP14+EGFR
ALT: 11 IU/L (ref 0–44)
AST: 16 IU/L (ref 0–40)
Albumin/Globulin Ratio: 1.9 (ref 1.2–2.2)
Albumin: 4.4 g/dL (ref 3.8–4.8)
Alkaline Phosphatase: 71 IU/L (ref 44–121)
BUN/Creatinine Ratio: 12 (ref 10–24)
BUN: 22 mg/dL (ref 8–27)
Bilirubin Total: 0.5 mg/dL (ref 0.0–1.2)
CO2: 25 mmol/L (ref 20–29)
Calcium: 9.1 mg/dL (ref 8.6–10.2)
Chloride: 97 mmol/L (ref 96–106)
Creatinine, Ser: 1.84 mg/dL — ABNORMAL HIGH (ref 0.76–1.27)
Globulin, Total: 2.3 g/dL (ref 1.5–4.5)
Glucose: 112 mg/dL — ABNORMAL HIGH (ref 70–99)
Potassium: 5.3 mmol/L — ABNORMAL HIGH (ref 3.5–5.2)
Sodium: 135 mmol/L (ref 134–144)
Total Protein: 6.7 g/dL (ref 6.0–8.5)
eGFR: 39 mL/min/{1.73_m2} — ABNORMAL LOW (ref >59)

## 2022-06-10 LAB — TSH: TSH: 0.693 u[IU]/mL (ref 0.450–4.500)

## 2022-06-10 LAB — PSA: Prostate Specific Ag, Serum: 1.6 ng/mL (ref 0.0–4.0)

## 2022-06-10 LAB — HEMOGLOBIN A1C
Est. average glucose Bld gHb Est-mCnc: 117 mg/dL
Hgb A1c MFr Bld: 5.7 % — ABNORMAL HIGH (ref 4.8–5.6)

## 2022-06-10 LAB — VITAMIN D 25 HYDROXY (VIT D DEFICIENCY, FRACTURES): Vit D, 25-Hydroxy: 27.3 ng/mL — ABNORMAL LOW (ref 30.0–100.0)

## 2022-06-11 ENCOUNTER — Telehealth: Payer: Self-pay | Admitting: Internal Medicine

## 2022-06-11 NOTE — Telephone Encounter (Signed)
Patient called in for lab results  ?

## 2022-06-11 NOTE — Telephone Encounter (Signed)
Patient return call for lab results

## 2022-06-13 ENCOUNTER — Encounter: Payer: Self-pay | Admitting: Internal Medicine

## 2022-06-13 NOTE — Assessment & Plan Note (Signed)
Quit smoking 2017  - Spirometry  01/04/17  FEV1 0.99 (23%)  Ratio 0.28 p ? Prior  = Allergy profile 04/09/20  >  Eos 0. 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 04/09/2020  MM   Level 156  - 04/09/2020 rec pred 20 until better then 10 mg daily x one week wean - 05/09/2020  After extensive coaching inhaler device,  effectiveness =    90% > rec  continue flovent 220/ stiolto and completely  taper off prednisone  - PFT's  06/24/20  FEV1 1.15 (26 % ) ratio 0.27  p 35 % improvement from saba p stiolto prior to study with DLCO  11.45 (35%) corrects to 1.88 (46%)  for alv volume and FV curve classic curvature  - 09/08/2020  After extensive coaching inhaler device,  effectiveness =    90% so try breztri  - 10/09/2020  After extensive coaching inhaler device,  effectiveness =    90% with smi > change back to stiolto and pred ceiling 20/floor 10 and added protonix 40 mg q am ac as pseudowheeze on exam - 11/06/2020 added pm doses of pepcid/ gerd recs including bed blocks   - .11/06/2020 pred floor of 10 mg / ceiling of 20 mg -  11/06/2020   Walked RA  approx   250 ft  @ avg pace  stopped due to  Sob/ fatigue with sats still 96%  Typical of a pink puffer  -  12/15/2020  After extensive coaching inhaler device,  effectiveness =    90%  -  01/28/2021   Walked RA  approx   400 ft  @ slow to moderate pace  stopped due to  Sob  With sats 94% - 05/05/2021 resumed prednisone  D  2 a day until better then 1 a day x 5 days and stop. - 06/09/2022  After extensive coaching inhaler device,  effectiveness =  80% with smi > continue stiolto and refer to pulmonary rehab   Decline in activity tol ? Why  DDX of  difficult airways management almost all start with A and  include Adherence, Ace Inhibitors, Acid Reflux, Active Sinus Disease, Alpha 1 Antitripsin deficiency, Anxiety masquerading as Airways dz,  ABPA,  Allergy(esp in young), Aspiration (esp in elderly), Adverse effects of meds,  Active smoking or vaping, A bunch of PE's (a small  clot burden can't cause this syndrome unless there is already severe underlying pulm or vascular dz with poor reserve) plus two Bs  = Bronchiectasis and Beta blocker use..and one C= CHF  Adherence is always the initial "prime suspect" and is a multilayered concern that requires a "trust but verify" approach in every patient - starting with knowing how to use medications, especially inhalers, correctly, keeping up with refills and understanding the fundamental difference between maintenance and prns vs those medications only taken for a very short course and then stopped and not refilled.  - see device teaching above - return with all meds in hand using a trust but verify approach to confirm accurate Medication  Reconciliation The principal here is that until we are certain that the  patients are doing what we've asked, it makes no sense to ask them to do more.   ? Allergy/asthma > pred x 6 days Re SABA :  I spent extra time with pt today reviewing appropriate use of albuterol for prn use on exertion with the following points: 1) saba is for relief of sob that does not  improve by walking a slower pace or resting but rather if the pt does not improve after trying this first. 2) If the pt is convinced, as many are, that saba helps recover from activity faster then it's easy to tell if this is the case by re-challenging : ie stop, take the inhaler, then p 5 minutes try the exact same activity (intensity of workload) that just caused the symptoms and see if they are substantially diminished or not after saba 3) if there is an activity that reproducibly causes the symptoms, try the saba 15 min before the activity on alternate days   If in fact the saba really does help, then fine to continue to use it prn but advised may need to look closer at the maintenance regimen being used to achieve better control of airways disease with exertion.   ? Acid (or non-acid) GERD > always difficult to exclude as up to 75%  of pts in some series report no assoc GI/ Heartburn symptoms> rec max (24h)  acid suppression and diet restrictions/ reviewed and instructions given in writing.    ? Anxiety/depression/deconditioning > usually at the bottom of this list of usual suspects but  may interfere with adherence and also interpretation of response or lack thereof to symptom management which can be quite subjective. >>> refer to rehab if willing  ?  Bunch of PE's > doubt/ but check d dimer to be complete  ? chf > doubt, but check bnp to be complete    F/u 6 weeks - call sooner if needed           Each maintenance medication was reviewed in detail including emphasizing most importantly the difference between maintenance and prns and under what circumstances the prns are to be triggered using an action plan format where appropriate.  Total time for H and P, chart review, counseling, reviewing smi/hfa/neb device(s) and generating customized AVS unique to this office visit / same day charting > 30 min for  refractory respiratory  symptoms of uncertain etiology

## 2022-06-14 NOTE — Telephone Encounter (Signed)
Pt advised with verbal understanding  °

## 2022-06-23 ENCOUNTER — Other Ambulatory Visit: Payer: Self-pay | Admitting: Internal Medicine

## 2022-06-23 DIAGNOSIS — G2581 Restless legs syndrome: Secondary | ICD-10-CM

## 2022-07-05 ENCOUNTER — Telehealth: Payer: Self-pay | Admitting: Internal Medicine

## 2022-07-05 NOTE — Telephone Encounter (Signed)
Patient see Dr Theador Hawthorne who is located in [place] if he goes to [place] office he needs to be asked to transfer to [place] office

## 2022-07-05 NOTE — Telephone Encounter (Signed)
Pt called wanting a referral to the kidney dr here in [place]?

## 2022-07-21 ENCOUNTER — Ambulatory Visit: Payer: Medicare Other | Admitting: Internal Medicine

## 2022-08-18 ENCOUNTER — Ambulatory Visit: Payer: Medicare Other | Admitting: Internal Medicine

## 2022-09-01 ENCOUNTER — Encounter: Payer: Self-pay | Admitting: Internal Medicine

## 2022-09-01 ENCOUNTER — Ambulatory Visit (INDEPENDENT_AMBULATORY_CARE_PROVIDER_SITE_OTHER): Payer: Medicare Other | Admitting: Internal Medicine

## 2022-09-01 DIAGNOSIS — J449 Chronic obstructive pulmonary disease, unspecified: Secondary | ICD-10-CM | POA: Diagnosis not present

## 2022-09-01 MED ORDER — FAMOTIDINE 20 MG PO TABS: ORAL_TABLET | ORAL | Status: DC

## 2022-09-01 MED ORDER — PANTOPRAZOLE SODIUM 40 MG PO TBEC: DELAYED_RELEASE_TABLET | ORAL | 2 refills | Status: DC

## 2022-09-01 MED ORDER — ALBUTEROL SULFATE HFA 108 (90 BASE) MCG/ACT IN AERS: INHALATION_SPRAY | RESPIRATORY_TRACT | 11 refills | Status: DC

## 2022-09-01 MED ORDER — PREDNISONE 10 MG PO TABS: ORAL_TABLET | ORAL | 11 refills | Status: DC

## 2022-09-01 NOTE — Patient Instructions (Addendum)
We can refer you to pulmonary rehab at [facility] if you wish   Continue Pantoprazole (protonix) 40 mg   Take  30-60 min before first meal of the day and Pepcid (famotidine)  20 mg after supper until return to office - this is the best way to tell whether stomach acid is contributing to your problem.    Plan A = Automatic = Always=  Stiolto 2 puffs first thing in am followed by Flovent 220 x 2 puffs repeat in 12hours  Work on inhaler technique:  relax and gently blow all the way out then take a nice smooth full deep breath back in, triggering the inhaler at same time you start breathing in.  Hold breath in for at least  5 seconds if you can. Blow out flovent  thru nose. Rinse and gargle with water when done.  If mouth or throat bother you at all,  try brushing teeth/gums/tongue with arm and hammer toothpaste/ make a slurry and gargle and spit out.       Plan B = Backup (to supplement plan A, not to replace it) Only use your albuterol inhaler as a rescue medication to be used if you can't catch your breath by resting or doing a relaxed purse lip breathing pattern.  - The less you use it, the better it will work when you need it. - Ok to use the inhaler up to 2 puffs  every 4 hours if you must but call for appointment if use goes up over your usual need - Don't leave home without it !!  (think of it like the spare tire for your car)   Plan C = Crisis (instead of Plan B but only if Plan B stops working) - only use your albuterol nebulizer if you first try Plan B and it fails to help > ok to use the nebulizer up to every 4 hours but if start needing it regularly call for immediate appointment   Plan D = Doctor -if ABC not working > Prednisone 10 mg take  4 each am x 2 days,   2 each am x 2 days,  1 each am x 2 days and stop    Please schedule a follow up office visit in 4 weeks, sooner if needed  with all medications /inhalers/ solutions in hand so we can verify exactly what you are taking. This  includes all medications from all doctors and over the counters

## 2022-09-01 NOTE — Progress Notes (Signed)
Scott Hubbard, male    DOB: 1956-09-21    MRN: [id number]   Brief patient profile:  26 yowm MM/quit smoking 04/2016 @ onset of severe spell of sob and since then doe gradually worse despite rx with flovent/ stiolto so referred to pulmonary clinic 04/09/2020 by Dr  Scott Hubbard     History of Present Illness  04/09/2020  Pulmonary/ 1st office eval/Scott Hubbard  Chief Complaint  Patient presents with   Pulmonary Consult    Referred by Dr. Benny Hubbard for eval of COPD. Former pt of Dr Scott Hubbard. He states he gets SOB "when I do anything".    Dyspnea:  [employer] x 2 aisles but walks "faster than other people" (walked very slowly in office) so MMRC3 = can't walk 100 yards even at a slow pace at a flat grade s stopping due to sob    Last time mb and back was prior to prior to when he quit smoking as has long driveway with incline on way back  Cough: none Sleep: flat bed / one pillow L side s respiratory symptoms  SABA use: 2 pffs each am / does not know how when to use hfa vs neb     rec Plan A = Automatic = Always=    Stiolto x 2 pffs/ flovent x 2puffs  first thing in am and repeat flovent in pm  Prednisone 10 mg x 2 daily until breathing better then 1 daily x a week and stop (#60)     Take valsartan 40 mg x 2 pills daily until you see your cardiologist            Plan B = Backup (to supplement plan A, not to replace it) Only use your albuterol inhaler as a rescue medication      06/09/2022  f/u ov/Scott Hubbard office/Scott Hubbard re: copd gold 4 maint on stiolto worse x several months  Chief Complaint  Patient presents with   Follow-up    Feels breathing has ben worse since last visit. No longer wheezing and not coughing as much   Dyspnea:  now rides scooter at [employer] Cough: none  Sleeping: flat bed/ one pillow  SABA use: hfa last used 3.5 h pta  5-6 x per day  02: none  Lung cancer screening: declined  Rec Plan A = Automatic = Always=    Stiolto 2 pffs 1st thing AM  and chase with flovent 2 puffs/ repeat  the flovent 12 hours later  Work on inhaler technique:   Plan B = Backup (to supplement plan A, not to replace it) Only use your albuterol inhaler as a rescue medication  Plan C = Crisis (instead of Plan B but only if Plan B stops working) - only use your albuterol nebulizer if you first try Plan B  Be sure you are taking pepcid (famotidine) 20 mg after bfast and after supper  Prednisone 10 mg take  4 each am x 2 days,   2 each am x 2 days,  1 each am x 2 days and stop    Please schedule a follow up office visit in 6 weeks, call sooner if needed with all medications /inhalers/ solutions in hand Add refer to rehab if willing    09/01/2022  f/u ov/Scott Hubbard office/Scott Hubbard re: GOLD 4 COPD  maint on Stiolto 2 each am  / didn't bring all meds Chief Complaint  Patient presents with   Follow-up    He is not doing well and breathing is worse.  Dyspnea:  can still push buggy but prefers to ride scooter Cough: none  Sleeping: flat bed / one pillow SABA use: confusing with flovent / never prechallenges with saba  02: none  Covid status: xax x 4      No obvious day to day or daytime variability or assoc excess/ purulent sputum or mucus plugs or hemoptysis or cp or chest tightness, subjective wheeze or overt sinus or hb symptoms.   Sleeping  without nocturnal  or early am exacerbation  of respiratory  c/o's or need for noct saba. Also denies any obvious fluctuation of symptoms with weather or environmental changes or other aggravating or alleviating factors except as outlined above   No unusual exposure hx or h/o childhood pna/ asthma or knowledge of premature birth.  Current Allergies, Complete Past Medical History, Past Surgical History, Family History, and Social History were reviewed in [employer] record.  ROS  The following are not active complaints unless bolded Hoarseness, sore throat, dysphagia, dental problems, itching, sneezing,  nasal congestion or discharge of  excess mucus or purulent secretions, ear ache,   fever, chills, sweats, unintended wt loss or wt gain, classically pleuritic or exertional cp,  orthopnea pnd or arm/hand swelling  or leg swelling, presyncope, palpitations, abdominal pain, anorexia, nausea, vomiting, diarrhea  or change in bowel habits or change in bladder habits, change in stools or change in urine, dysuria, hematuria,  rash, arthralgias, visual complaints, headache, numbness, weakness or ataxia or problems with walking or coordination,  change in mood or  memory.        Current Meds - - NOTE:   Unable to verify as accurately reflecting what pt takes    Medication Sig   acetaminophen (TYLENOL) 500 MG tablet Take 1,000 mg by mouth every 6 (six) hours as needed.   albuterol (PROVENTIL) (2.5 MG/3ML) 0.083% nebulizer solution Take 3 mLs (2.5 mg total) by nebulization every 4 (four) hours as needed for wheezing or shortness of breath.   albuterol (VENTOLIN HFA) 108 (90 Base) MCG/ACT inhaler INHALE 2 PUFFS INTO THE LUNGS EVERY 4 HOURS AS NEEDED FOR WHEEZING OR SHORTNESS OF BREATH.   cyclobenzaprine (FLEXERIL) 10 MG tablet TAKE 1 TABLET BY MOUTH TWICE A DAY AS NEEDED FOR MUSCLE SPASMS   famotidine (PEPCID) 20 MG tablet One after breakfast and supper   FLOVENT HFA 220 MCG/ACT inhaler TAKE 2 PUFFS BY MOUTH TWICE A DAY   fluticasone (FLONASE) 50 MCG/ACT nasal spray SPRAY 2 SPRAYS INTO EACH NOSTRIL EVERY DAY   rOPINIRole (REQUIP) 1 MG tablet Take 1 tablet (1 mg total) by mouth at bedtime.   Tiotropium Bromide-Olodaterol (STIOLTO RESPIMAT) 2.5-2.5 MCG/ACT AERS INHALE 2 PUFFS BY MOUTH INTO THE LUNGS DAILY   triamcinolone cream (KENALOG) 0.1 % APPLY TO AFFECTED AREA TWICE A DAY   valsartan-hydrochlorothiazide (DIOVAN HCT) 80-12.5 MG tablet Take 1 tablet by mouth daily.                                                Past Medical History:  Diagnosis Date   Chest discomfort    negative stress echocardiogram-01/2011   Chronic bronchitis     COPD (chronic obstructive pulmonary disease) (HCC)    DJD (degenerative joint disease)    Dyspnea    Gastro-esophageal reflux disease with esophagitis    GERD (gastroesophageal reflux disease)  Hyperlipidemia    Hypertension    Migraine    PVC (premature ventricular contraction)    Frequent   Tobacco abuse    Tobacco abuse    40 pack years       Objective:    Wts  09/01/2022        172  06/09/2022        176 12/16/2021      183  05/05/2021        192  01/28/2021         203 11/06/2020      200  09/08/2020       195   05/09/20 197 lb (89.4 kg)  04/30/20 197 lb (89.4 kg)  04/09/20 197 lb (89.4 kg)     Vital signs reviewed  09/01/2022  - Note at rest 02 sats  97% on RA   General appearance:    thin amb wm easily confused with details of care  / prominent pseudowheeze     HEENT :  Oropharynx  clear/ edentulous   Nasal turbinates nl    NECK :  without JVD/Nodes/TM/ nl carotid upstrokes bilaterally   LUNGS: no acc muscle use,  Mod barrel  contour chest wall with bilateral  Distant bs s audible wheeze and  without cough on insp or exp maneuvers and mod  Hyperresonant  to  percussion bilaterally     CV:  RRR  no s3 or murmur or increase in P2, and no edema   ABD:  soft and nontender with pos mid insp Hoover's  in the supine position. No bruits or organomegaly appreciated, bowel sounds nl  MS:   Ext warm without deformities or   obvious joint restrictions , calf tenderness, cyanosis or clubbing  SKIN: warm and dry without lesions    NEURO:  alert, approp, nl sensorium with  no motor or cerebellar deficits apparent.                 Assessment

## 2022-09-01 NOTE — Assessment & Plan Note (Addendum)
Quit smoking 2017  - Spirometry  01/04/17  FEV1 0.99 (23%)  Ratio 0.28 p ? Prior  = Allergy profile 04/09/20  >  Eos 0. 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 04/09/2020  MM   Level 156  - 04/09/2020 rec pred 20 until better then 10 mg daily x one week wean - 05/09/2020  After extensive coaching inhaler device,  effectiveness =    90% > rec  continue flovent 220/ stiolto and completely  taper off prednisone  - PFT's  06/24/20  FEV1 1.15 (26 % ) ratio 0.27  p 35 % improvement from saba p stiolto prior to study with DLCO  11.45 (35%) corrects to 1.88 (46%)  for alv volume and FV curve classic curvature  - 09/08/2020  After extensive coaching inhaler device,  effectiveness =    90% so try breztri  - 10/09/2020  After extensive coaching inhaler device,  effectiveness =    90% with smi > change back to stiolto and pred ceiling 20/floor 10 and added protonix 40 mg q am ac as pseudowheeze on exam - 11/06/2020 added pm doses of pepcid/ gerd recs including bed blocks   - .11/06/2020 pred floor of 10 mg / ceiling of 20 mg -  11/06/2020   Walked RA  approx   250 ft  @ avg pace  stopped due to  Sob/ fatigue with sats still 96%  Typical of a pink puffer  -  12/15/2020  After extensive coaching inhaler device,  effectiveness =    90%  -  01/28/2021   Walked RA  approx   400 ft  @ slow to moderate pace  stopped due to  Sob  With sats 94% - 05/05/2021 resumed prednisone  D  2 a day until better then 1 a day x 5 days and stop. - 06/09/2022  continue stiolto  Plus flovent 220 2bid  - 09/01/2022  After extensive coaching inhaler device,  effectiveness =    90% with smi, 50% with hfa  - 09/01/2022 added back Prednisone as plan D = 6 day taper  - 09/01/2022   Walked on RA  x  1  lap(s) =  approx 150  ft  @ moderate pace, stopped due to sob with lowest 02 sats 95%   Group D (now reclassified as E) in terms of symptom/risk and laba/lama/ICS  therefore appropriate rx at this point >>>  Continue stiolto/flovent for now with more approp  use of saba and pred as plan D and empirical rx for upper aiway wheeze with max gerd rx   Re SABA :  I spent extra time with pt today reviewing appropriate use of albuterol for prn use on exertion with the following points: 1) saba is for relief of sob that does not improve by walking a slower pace or resting but rather if the pt does not improve after trying this first. 2) If the pt is convinced, as many are, that saba helps recover from activity faster then it's easy to tell if this is the case by re-challenging : ie stop, take the inhaler, then p 5 minutes try the exact same activity (intensity of workload) that just caused the symptoms and see if they are substantially diminished or not after saba 3) if there is an activity that reproducibly causes the symptoms, try the saba 15 min before the activity on alternate days   If in fact the saba really does help, then fine to  continue to use it prn but advised may need to look closer at the maintenance regimen being used to achieve better control of airways disease with exertion.   F/u 4 weeks with all meds in hand using a trust but verify approach to confirm accurate Medication  Reconciliation The principal here is that until we are certain that the  patients are doing what we've asked, it makes no sense to ask them to do more.    Each maintenance medication was reviewed in detail including emphasizing most importantly the difference between maintenance and prns and under what circumstances the prns are to be triggered using an action plan format where appropriate.  Total time for H and P, chart review, counseling, reviewing hfa/smi device(s) , directly observing portions of ambulatory 02 saturation study/ and generating customized AVS unique to this office visit / same day charting > 30 min refractory sob/ non-adherence and ? Underlying vcd/gerd

## 2022-09-06 ENCOUNTER — Ambulatory Visit (INDEPENDENT_AMBULATORY_CARE_PROVIDER_SITE_OTHER): Payer: Medicare Other

## 2022-09-06 DIAGNOSIS — Z0000 Encounter for general adult medical examination without abnormal findings: Secondary | ICD-10-CM

## 2022-09-06 NOTE — Patient Instructions (Signed)
  Mr. Borowiak , Thank you for taking time to come for your Medicare Wellness Visit. I appreciate your ongoing commitment to your health goals. Please review the following plan we discussed and let me know if I can assist you in the future.   These are the goals we discussed:  Goals      Patient Stated     Live life.        This is a list of the screening recommended for you and due dates:  Health Maintenance  Topic Date Due   Pneumonia Vaccine (1 - PCV) Never done   Colon Cancer Screening  Never done   Zoster (Shingles) Vaccine (1 of 2) Never done   COVID-19 Vaccine (5 - Moderna series) 11/03/2021   Flu Shot  Never done   Tetanus Vaccine  10/03/2028   Hepatitis C Screening: USPSTF Recommendation to screen - Ages 18-79 yo.  Completed   HPV Vaccine  Aged Out

## 2022-09-06 NOTE — Progress Notes (Signed)
Subjective:   Scott Hubbard is a 66 y.o. male who presents for Medicare Annual/Subsequent preventive examination.  Review of Systems    I connected with  Scott Hubbard on 09/06/22 by a audio enabled telemedicine application and verified that I am speaking with the correct person using two identifiers.  Patient Location: Home  Provider Location: Office/Clinic  I discussed the limitations of evaluation and management by telemedicine. The patient expressed understanding and agreed to proceed.        Objective:    There were no vitals filed for this visit. There is no height or weight on file to calculate BMI.     09/01/2021    3:11 PM 08/03/2021   10:08 AM 10/03/2018    6:15 PM 04/28/2016   12:47 PM  Advanced Directives  Does Patient Have a Medical Advance Directive? No No No No  Would patient like information on creating a medical advance directive?  No - Patient declined      Current Medications (verified) Outpatient Encounter Medications as of 09/06/2022  Medication Sig   acetaminophen (TYLENOL) 500 MG tablet Take 1,000 mg by mouth every 6 (six) hours as needed.   albuterol (PROAIR HFA) 108 (90 Base) MCG/ACT inhaler 2 puffs every 4 hours as needed only  if your can't catch your breath   albuterol (PROVENTIL) (2.5 MG/3ML) 0.083% nebulizer solution Take 3 mLs (2.5 mg total) by nebulization every 4 (four) hours as needed for wheezing or shortness of breath.   cyclobenzaprine (FLEXERIL) 10 MG tablet TAKE 1 TABLET BY MOUTH TWICE A DAY AS NEEDED FOR MUSCLE SPASMS   famotidine (PEPCID) 20 MG tablet One after  supper   FLOVENT HFA 220 MCG/ACT inhaler TAKE 2 PUFFS BY MOUTH TWICE A DAY   fluticasone (FLONASE) 50 MCG/ACT nasal spray SPRAY 2 SPRAYS INTO EACH NOSTRIL EVERY DAY   pantoprazole (PROTONIX) 40 MG tablet Take 30-60 min before first meal of the day   predniSONE (DELTASONE) 10 MG tablet Take  4 each am x 2 days,   2 each am x 2 days,  1 each am x 2 days and stop   rOPINIRole  (REQUIP) 1 MG tablet Take 1 tablet (1 mg total) by mouth at bedtime.   Tiotropium Bromide-Olodaterol (STIOLTO RESPIMAT) 2.5-2.5 MCG/ACT AERS INHALE 2 PUFFS BY MOUTH INTO THE LUNGS DAILY   triamcinolone cream (KENALOG) 0.1 % APPLY TO AFFECTED AREA TWICE A DAY   valsartan-hydrochlorothiazide (DIOVAN HCT) 80-12.5 MG tablet Take 1 tablet by mouth daily.   No facility-administered encounter medications on file as of 09/06/2022.    Allergies (verified) Patient has no known allergies.   History: Past Medical History:  Diagnosis Date   Chest discomfort    negative stress echocardiogram-01/2011   Chronic bronchitis    COPD (chronic obstructive pulmonary disease) (HCC)    DJD (degenerative joint disease)    Gastro-esophageal reflux disease with esophagitis    GERD (gastroesophageal reflux disease)    Hyperlipidemia    Hypertension    Migraine    PVC (premature ventricular contraction)    Frequent   Tobacco abuse    40 pack years   Past Surgical History:  Procedure Laterality Date   None     No family history on file. Social History   Socioeconomic History   Marital status: Legally Separated    Spouse name: Not on file   Number of children: 2   Years of education: Not on file   Highest education level: Not  on file  Occupational History   Occupation: unemployed    Employer: UNEMPLOYED  Tobacco Use   Smoking status: Former    Packs/day: 1.50    Years: 40.00    Total pack years: 60.00    Types: Cigarettes    Quit date: 04/27/2016    Years since quitting: 6.3   Smokeless tobacco: Never  Vaping Use   Vaping Use: Never used  Substance and Sexual Activity   Alcohol use: No   Drug use: No   Sexual activity: Not on file  Other Topics Concern   Not on file  Social History Narrative   Lives with girlfriend.  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     Social Determinants of Health   Financial Resource Strain: Low Risk  (09/01/2021)   Overall Financial Resource Strain (CARDIA)     Difficulty of Paying Living Expenses: Not very hard  Food Insecurity: No Food Insecurity (09/01/2021)   Hunger Vital Sign    Worried About Running Out of Food in the Last Year: Never true    Ran Out of Food in the Last Year: Never true  Transportation Needs: No Transportation Needs (09/01/2021)   PRAPARE - Hydrologist (Medical): No    Lack of Transportation (Non-Medical): No  Physical Activity: Inactive (09/01/2021)   Exercise Vital Sign    Days of Exercise per Week: 0 days    Minutes of Exercise per Session: 0 min  Stress: No Stress Concern Present (09/01/2021)   [place]    Feeling of Stress : Not at all  Social Connections: Moderately Integrated (09/01/2021)   Social Connection and Isolation Panel [NHANES]    Frequency of Communication with Friends and Family: More than three times a week    Frequency of Social Gatherings with Friends and Family: Never    Attends Religious Services: Never    Marine scientist or Organizations: Yes    Attends Archivist Meetings: Never    Marital Status: Living with partner    Tobacco Counseling Counseling given: Not Answered   Clinical Intake:      Diabetic?NO          Activities of Daily Living     No data to display           Patient Care Team: Lindell Spar, MD as PCP - General (Internal Medicine) Lattie Haw, Cristopher Estimable, MD (Inactive) (Cardiology)  Indicate any recent Medical Services you may have received from other than [facility] providers in the past year (date may be approximate).     Assessment:   This is a routine wellness examination for Scott Hubbard.  Hearing/Vision screen No results found.  Dietary issues and exercise activities discussed:     Goals Addressed   None   Depression Screen    06/09/2022    1:52 PM 02/04/2022    2:42 PM 09/03/2021    2:08 PM 09/01/2021    3:09 PM 08/06/2021    1:47 PM  07/30/2021    1:54 PM 03/03/2021    1:50 PM  PHQ 2/9 Scores  PHQ - 2 Score 0 0 3 0 2 0 0  PHQ- 9 Score   5 0 9      Fall Risk    06/09/2022    1:52 PM 02/04/2022    2:41 PM 09/03/2021    2:07 PM 09/01/2021    3:11 PM 08/06/2021    1:47 PM  Fall Risk   Falls in the past year? 0 0 '1 1 1  '$ Number falls in past yr: 0 0 0 1 1  Injury with Fall? 0 0 1 1 0  Risk for fall due to : No Fall Risks No Fall Risks History of fall(s) No Fall Risks Impaired balance/gait  Follow up Falls evaluation completed Falls evaluation completed Falls evaluation completed;Education provided;Falls prevention discussed Falls evaluation completed Falls evaluation completed    FALL RISK PREVENTION PERTAINING TO THE HOME:  Any stairs in or around the home? YES If so, are there any without handrails? No  Home free of loose throw rugs in walkways, pet beds, electrical cords, etc? Yes  Adequate lighting in your home to reduce risk of falls? Yes   ASSISTIVE DEVICES UTILIZED TO PREVENT FALLS:  Life alert? No  Use of a cane, walker or w/c? No  Grab bars in the bathroom? No  Shower chair or bench in shower? No  Elevated toilet seat or a handicapped toilet? No         09/01/2021    3:14 PM  6CIT Screen  What Year? 0 points  What month? 0 points  What time? 0 points  Count back from 20 0 points  Months in reverse 0 points  Repeat phrase 0 points  Total Score 0 points    Immunizations Immunization History  Administered Date(s) Administered   Moderna Covid-19 Vaccine Bivalent Booster 36yr & up 09/08/2021   Moderna SARS-COV2 Booster Vaccination 09/08/2021   Moderna Sars-Covid-2 Vaccination 03/25/2020, 04/22/2020, 11/25/2020, 03/31/2021   Tdap 12/11/2003, 12/20/2008, 10/03/2018    TDAP status: Up to date  Flu Vaccine status: Due, Education has been provided regarding the importance of this vaccine. Advised may receive this vaccine at local pharmacy or Health Dept. Aware to provide a copy of the vaccination  record if obtained from local pharmacy or Health Dept. Verbalized acceptance and understanding.  Pneumococcal vaccine status: Due, Education has been provided regarding the importance of this vaccine. Advised may receive this vaccine at local pharmacy or Health Dept. Aware to provide a copy of the vaccination record if obtained from local pharmacy or Health Dept. Verbalized acceptance and understanding.  Covid-19 vaccine status: Completed vaccines  Qualifies for Shingles Vaccine? Yes   Zostavax completed No   Shingrix Completed?: No.    Education has been provided regarding the importance of this vaccine. Patient has been advised to call insurance company to determine out of pocket expense if they have not yet received this vaccine. Advised may also receive vaccine at local pharmacy or Health Dept. Verbalized acceptance and understanding.  Screening Tests Health Maintenance  Topic Date Due   Pneumonia Vaccine 68 Years old (1 - PCV) Never done   COLONOSCOPY (Pts 45-434yrInsurance coverage will need to be confirmed)  Never done   Zoster Vaccines- Shingrix (1 of 2) Never done   COVID-19 Vaccine (5 - Moderna series) 11/03/2021   INFLUENZA VACCINE  Never done   TETANUS/TDAP  10/03/2028   Hepatitis C Screening  Completed   HPV VACCINES  Aged Out    Health Maintenance  Health Maintenance Due  Topic Date Due   Pneumonia Vaccine [age]Years old (1 - PCV) Never done   COLONOSCOPY (Pts 45-4933yrnsurance coverage will need to be confirmed)  Never done   Zoster Vaccines- Shingrix (1 of 2) Never done   COVID-19 Vaccine (5 - Moderna series) 11/03/2021   INFLUENZA VACCINE  Never done    Colorectal cancer  screening: Referral to GI placed  . Pt aware the office will call re: appt.  Lung Cancer Screening: (Low Dose CT Chest recommended if Age 48-80 years, 30 pack-year currently smoking OR have quit w/in 15years.) does qualify.   Lung Cancer Screening Referral: NO  Additional  Screening:  Hepatitis C Screening: does qualify; Completed 09/03/21  Vision Screening: Recommended annual ophthalmology exams for early detection of glaucoma and other disorders of the eye. Is the patient up to date with their annual eye exam? Yes Who is the provider or what is the name of the office in which the patient attends annual eye exams? N/A If pt is not established with a provider, would they like to be referred to a provider to establish care? No .   Dental Screening: Recommended annual dental exams for proper oral hygiene  Community Resource Referral / Chronic Care Management: CRR required this visit?  No   CCM required this visit?  No      Plan:     I have personally reviewed and noted the following in the patient's chart:   Medical and social history Use of alcohol, tobacco or illicit drugs  Current medications and supplements including opioid prescriptions. Patient is not currently taking opioid prescriptions. Functional ability and status Nutritional status Physical activity Advanced directives List of other physicians Hospitalizations, surgeries, and ER visits in previous 12 months Vitals Screenings to include cognitive, depression, and falls Referrals and appointments  In addition, I have reviewed and discussed with patient certain preventive protocols, quality metrics, and best practice recommendations. A written personalized care plan for preventive services as well as general preventive health recommendations were provided to patient.     Quentin Angst, [place]   09/06/2022

## 2022-09-20 IMAGING — DX DG CHEST 2V
2 series · 2 of 2 positions shown · non-contrast
Comparison: 10/14/2020

CLINICAL DATA: 65-year-old male with cough

EXAM:
CHEST - 2 VIEW

[chest pa]
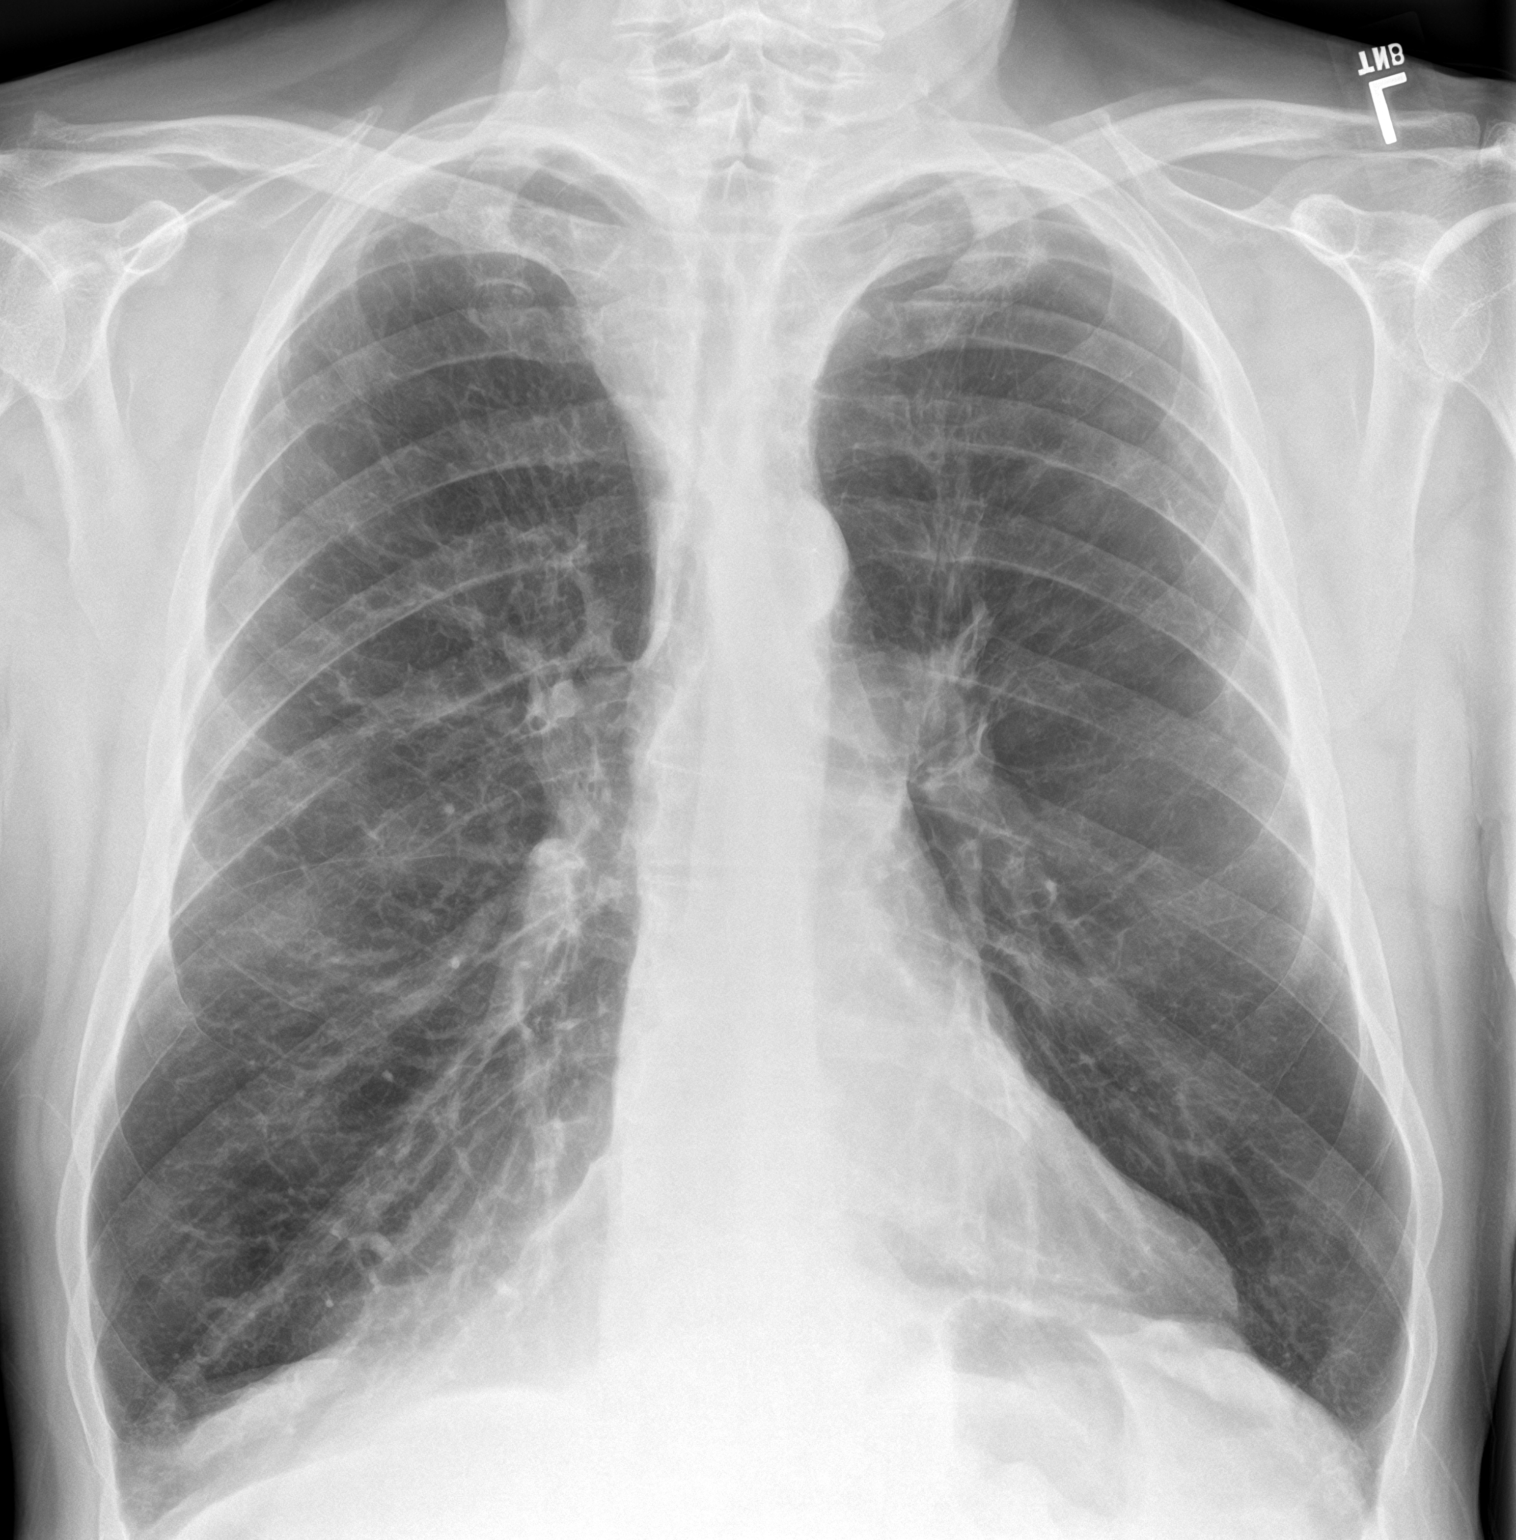

[chest lat]
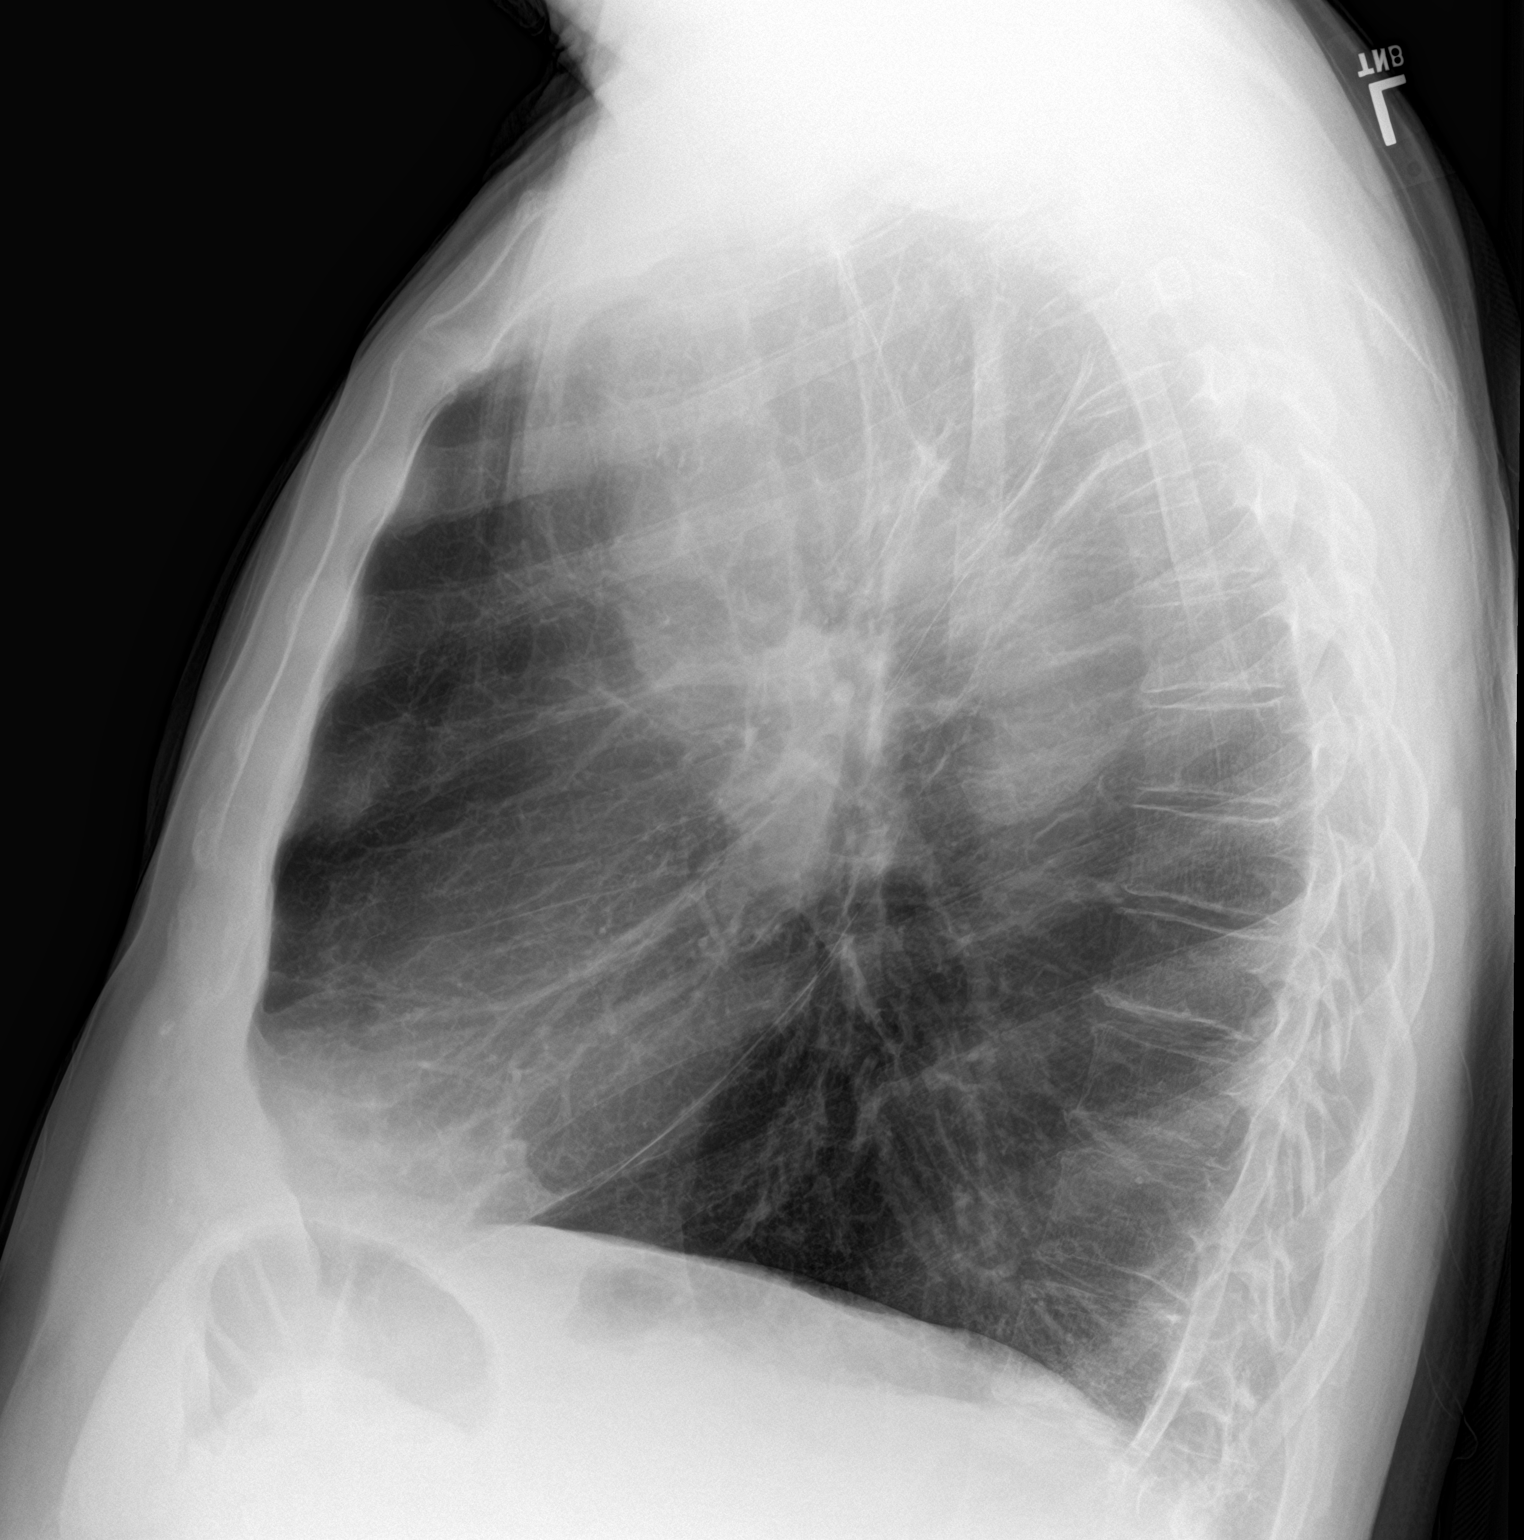

[2 of 2 positions shown; findings below may reference images not displayed]

FINDINGS: Cardiomediastinal silhouette unchanged in size and contour.

Stigmata of emphysema, with increased retrosternal airspace,
flattened hemidiaphragms, increased AP diameter, and hyperinflation
on the AP view.

Architectural distortion in the hilar regions with similar
appearance interstitial opacities and linear opacities extending
peripherally. No pneumothorax pleural effusion or new confluent
airspace disease.

Degenerative changes the spine.  No acute displaced fracture
IMPRESSION: Emphysema and chronic lung changes without definite evidence of
acute cardiopulmonary disease

## 2022-09-28 ENCOUNTER — Other Ambulatory Visit: Payer: Self-pay | Admitting: Internal Medicine

## 2022-09-28 DIAGNOSIS — G2581 Restless legs syndrome: Secondary | ICD-10-CM

## 2022-09-29 ENCOUNTER — Ambulatory Visit: Payer: Medicare Other | Admitting: Internal Medicine

## 2022-09-29 ENCOUNTER — Encounter: Payer: Self-pay | Admitting: Internal Medicine

## 2022-09-29 ENCOUNTER — Ambulatory Visit (INDEPENDENT_AMBULATORY_CARE_PROVIDER_SITE_OTHER): Payer: Medicare Other | Admitting: Internal Medicine

## 2022-09-29 DIAGNOSIS — Z87891 Personal history of nicotine dependence: Secondary | ICD-10-CM | POA: Diagnosis not present

## 2022-09-29 DIAGNOSIS — J449 Chronic obstructive pulmonary disease, unspecified: Secondary | ICD-10-CM | POA: Diagnosis not present

## 2022-09-29 MED ORDER — PREDNISONE 10 MG PO TABS: ORAL_TABLET | ORAL | 11 refills | Status: DC

## 2022-09-29 NOTE — Progress Notes (Signed)
Scott Hubbard, male    DOB: 1956/07/28    MRN: [id number]   Brief patient profile:  28 yowm MM/quit smoking 04/2016 @ onset of severe spell of sob and since then doe gradually worse despite rx with flovent/ stiolto so referred to pulmonary clinic 04/09/2020 by Dr  [name]     History of Present Illness  04/09/2020  Pulmonary/ 1st office eval/Scott Hubbard  Chief Complaint  Patient presents with   Pulmonary Consult    Referred by Dr. [name] for eval of COPD. Former pt of Dr [name]. He states he gets SOB "when I do anything".    Dyspnea:  foodlion x 2 aisles but walks "faster than other people" (walked very slowly in office) so MMRC3 = can't walk 100 yards even at a slow pace at a flat grade s stopping due to sob    Last time mb and back was prior to prior to when he quit smoking as has long driveway with incline on way back  Cough: none Sleep: flat bed / one pillow L side s respiratory symptoms  SABA use: 2 pffs each am / does not know how when to use hfa vs neb     rec Plan A = Automatic = Always=    Stiolto x 2 pffs/ flovent x 2puffs  first thing in am and repeat flovent in pm  Prednisone 10 mg x 2 daily until breathing better then 1 daily x a week and stop (#60)     Take valsartan 40 mg x 2 pills daily until you see your cardiologist            Plan B = Backup (to supplement plan A, not to replace it) Only use your albuterol inhaler as a rescue medication      06/09/2022  f/u ov/[place] office/Scott Hubbard re: copd gold 4 maint on stiolto worse x several months  Chief Complaint  Patient presents with   Follow-up    Feels breathing has ben worse since last visit. No longer wheezing and not coughing as much   Dyspnea:  now rides scooter at [employer] Cough: none  Sleeping: flat bed/ one pillow  SABA use: hfa last used 3.5 h pta  5-6 x per day  02: none  Lung cancer screening: declined  Rec Plan A = Automatic = Always=    Stiolto 2 pffs 1st thing AM  and chase with flovent 2 puffs/ repeat  the flovent 12 hours later  Work on inhaler technique:   Plan B = Backup (to supplement plan A, not to replace it) Only use your albuterol inhaler as a rescue medication  Plan C = Crisis (instead of Plan B but only if Plan B stops working) - only use your albuterol nebulizer if you first try Plan B  Be sure you are taking pepcid (famotidine) 20 mg after bfast and after supper  Prednisone 10 mg take  4 each am x 2 days,   2 each am x 2 days,  1 each am x 2 days and stop    Please schedule a follow up office visit in 6 weeks, call sooner if needed with all medications /inhalers/ solutions in hand Add refer to rehab if willing    09/01/2022  f/u ov/[place] office/Scott Hubbard re: GOLD 4 COPD  maint on Stiolto 2 each am  / didn't bring all meds Chief Complaint  Patient presents with   Follow-up    He is not doing well and breathing is worse.  Dyspnea:  can still push buggy but prefers to ride scooter Cough: none  Sleeping: flat bed / one pillow SABA use: confusing with flovent / never prechallenges with saba  02: none  Covid status: xax x 4  Rec We can refer you to pulmonary rehab at [facility] if you wish   Continue Pantoprazole (protonix) 40 mg   Take  30-60 min before first meal of the day and Pepcid (famotidine)  20 mg after supper until return to office - this is the best way to tell whether stomach acid is contributing to your problem.    Plan A = Automatic = Always=  Stiolto 2 puffs first thing in am followed by Flovent 220 x 2 puffs repeat in 12hours Work on inhaler technique:  Plan B = Backup (to supplement plan A, not to replace it) Only use your albuterol inhaler as a rescue medication to be used if you can't catch your breath by resting or doing a relaxed purse lip breathing pattern.  - The less you use it, the better it will work when you need it. - Ok to use the inhaler up to 2 puffs  every 4 hours if you must but call for appointment if use goes up over your usual need - Don't  leave home without it !!  (think of it like the spare tire for your car)   Plan C = Crisis (instead of Plan B but only if Plan B stops working) - only use your albuterol nebulizer if you first try Plan B and it fails to help > ok to use the nebulizer up to every 4 hours but if start needing it regularly call for immediate appointment   Plan D = Doctor -if ABC not working > Prednisone 10 mg take  4 each am x 2 days,   2 each am x 2 days,  1 each am x 2 days and stop    Please schedule a follow up office visit in 4 weeks, sooner if needed  with all medications /inhalers/ solutions in hand so we can verify exactly what you are taking. This includes all medications from all doctors and over the counters    09/29/2022  f/u ov/[place] office/Scott Hubbard re: GOLD 4 maint on stiolto/flovent  No chief complaint on file. 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 Cough: occ white minimal vol  Sleeping: bed is flat/ one pillow  SABA use: maybe once a month  02: none       No obvious day to day or daytime variability or assoc excess/ purulent sputum or mucus plugs or hemoptysis or cp or chest tightness, subjective wheeze or overt sinus or hb symptoms.   sleeping without nocturnal  or early am exacerbation  of respiratory  c/o's or need for noct saba. Also denies any obvious fluctuation of symptoms with weather or environmental changes or other aggravating or alleviating factors except as outlined above   No unusual exposure hx or h/o childhood pna/ asthma or knowledge of premature birth.  Current Allergies, Complete Past Medical History, Past Surgical History, Family History, and Social History were reviewed in [employer] record.  ROS  The following are not active complaints unless bolded Hoarseness, sore throat, dysphagia, dental problems, itching, sneezing,  nasal congestion or discharge of excess mucus or purulent secretions, ear ache,   fever, chills, sweats,  unintended wt loss or wt gain, classically pleuritic or exertional cp,  orthopnea pnd or arm/hand swelling  or leg swelling, presyncope, palpitations, abdominal pain, anorexia, nausea, vomiting, diarrhea  or change in bowel habits or change in bladder habits, change in stools or change in urine, dysuria, hematuria,  rash, arthralgias, visual complaints, headache, numbness, weakness or ataxia or problems with walking or coordination,  change in mood or  memory.        Current Meds  Medication Sig   acetaminophen (TYLENOL) 500 MG tablet Take 1,000 mg by mouth every 6 (six) hours as needed.   albuterol (PROAIR HFA) 108 (90 Base) MCG/ACT inhaler 2 puffs every 4 hours as needed only  if your can't catch your breath   albuterol (PROVENTIL) (2.5 MG/3ML) 0.083% nebulizer solution Take 3 mLs (2.5 mg total) by nebulization every 4 (four) hours as needed for wheezing or shortness of breath.   cyclobenzaprine (FLEXERIL) 10 MG tablet TAKE 1 TABLET BY MOUTH TWICE A DAY AS NEEDED FOR MUSCLE SPASMS   famotidine (PEPCID) 20 MG tablet One after  supper   FLOVENT HFA 220 MCG/ACT inhaler TAKE 2 PUFFS BY MOUTH TWICE A DAY   pantoprazole (PROTONIX) 40 MG tablet Take 30-60 min before first meal of the day   rOPINIRole (REQUIP) 1 MG tablet TAKE 1 TABLET BY MOUTH AT BEDTIME.   Tiotropium Bromide-Olodaterol (STIOLTO RESPIMAT) 2.5-2.5 MCG/ACT AERS INHALE 2 PUFFS BY MOUTH INTO THE LUNGS DAILY   triamcinolone cream (KENALOG) 0.1 % APPLY TO AFFECTED AREA TWICE A DAY   valsartan-hydrochlorothiazide (DIOVAN-HCT) 80-12.5 MG tablet TAKE 1 TABLET BY MOUTH EVERY DAY                                                   Past Medical History:  Diagnosis Date   Chest discomfort    negative stress echocardiogram-01/2011   Chronic bronchitis    COPD (chronic obstructive pulmonary disease) (HCC)    DJD (degenerative joint disease)    Dyspnea    Gastro-esophageal reflux disease with esophagitis    GERD (gastroesophageal reflux  disease)    Hyperlipidemia    Hypertension    Migraine    PVC (premature ventricular contraction)    Frequent   Tobacco abuse    Tobacco abuse    40 pack years       Objective:    Wts  09/29/2022      176 09/01/2022        172  06/09/2022        176 12/16/2021      183  05/05/2021        192  01/28/2021         203 11/06/2020      200  09/08/2020       195   05/09/20 197 lb (89.4 kg)  04/30/20 197 lb (89.4 kg)  04/09/20 197 lb (89.4 kg)     Vital signs reviewed  09/29/2022  - Note at rest 02 sats  97% on RA    General appearance:    amb wm nad     HEENT :  Oropharynx  clear/ edentulous   Nasal turbinates mild edema    NECK :  without JVD/Nodes/TM/ nl carotid upstrokes bilaterally   LUNGS: no acc muscle use,  Mod barrel  contour chest wall with bilateral  Distant bs s audible wheeze and  without cough on insp or exp maneuvers and mod  Hyperresonant  to  percussion bilaterally     CV:  RRR  no s3 or murmur or increase in P2, and no edema   ABD:  soft and nontender with pos mid insp Hoover's  in the supine position. No bruits or organomegaly appreciated, bowel sounds nl  MS:   Ext warm without deformities or   obvious joint restrictions , calf tenderness, cyanosis or clubbing  SKIN: warm and dry without lesions    NEURO:  alert, approp, nl sensorium with  no motor or cerebellar deficits apparent.                     Assessment

## 2022-09-29 NOTE — Progress Notes (Deleted)
Scott Hubbard, male    DOB: 07-31-1956    MRN: [id number]   Brief patient profile:  77 yowm MM/quit smoking 04/2016 @ onset of severe spell of sob and since then doe gradually worse despite rx with flovent/ stiolto so referred to pulmonary clinic 04/09/2020 by Dr  [name]     History of Present Illness  04/09/2020  Pulmonary/ 1st office eval/[name]  Chief Complaint  Patient presents with   Pulmonary Consult    Referred by Dr. [name] for eval of COPD. Former pt of Dr [name]. He states he gets SOB "when I do anything".    Dyspnea:  [employer] x 2 aisles but walks "faster than other people" (walked very slowly in office) so MMRC3 = can't walk 100 yards even at a slow pace at a flat grade s stopping due to sob    Last time mb and back was prior to prior to when he quit smoking as has long driveway with incline on way back  Cough: none Sleep: flat bed / one pillow L side s respiratory symptoms  SABA use: 2 pffs each am / does not know how when to use hfa vs neb     rec Plan A = Automatic = Always=    Stiolto x 2 pffs/ flovent x 2puffs  first thing in am and repeat flovent in pm  Prednisone 10 mg x 2 daily until breathing better then 1 daily x a week and stop (#60)     Take valsartan 40 mg x 2 pills daily until you see your cardiologist            Plan B = Backup (to supplement plan A, not to replace it) Only use your albuterol inhaler as a rescue medication      06/09/2022  f/u ov/[place] office/[name] re: copd gold 4 maint on stiolto worse x several months  Chief Complaint  Patient presents with   Follow-up    Feels breathing has ben worse since last visit. No longer wheezing and not coughing as much   Dyspnea:  now rides scooter at [employer] Cough: none  Sleeping: flat bed/ one pillow  SABA use: hfa last used 3.5 h pta  5-6 x per day  02: none  Lung cancer screening: declined  Rec Plan A = Automatic = Always=    Stiolto 2 pffs 1st thing AM  and chase with flovent 2 puffs/ repeat  the flovent 12 hours later  Work on inhaler technique:   Plan B = Backup (to supplement plan A, not to replace it) Only use your albuterol inhaler as a rescue medication  Plan C = Crisis (instead of Plan B but only if Plan B stops working) - only use your albuterol nebulizer if you first try Plan B  Be sure you are taking pepcid (famotidine) 20 mg after bfast and after supper  Prednisone 10 mg take  4 each am x 2 days,   2 each am x 2 days,  1 each am x 2 days and stop    Please schedule a follow up office visit in 6 weeks, call sooner if needed with all medications /inhalers/ solutions in hand Add refer to rehab if willing    09/01/2022  f/u ov/[place] office/[name] re: GOLD 4 COPD  maint on Stiolto 2 each am  / didn't bring all meds Chief Complaint  Patient presents with   Follow-up    He is not doing well and breathing is worse.  Dyspnea:  can still push buggy but prefers to ride scooter Cough: none  Sleeping: flat bed / one pillow SABA use: confusing with flovent / never prechallenges with saba  02: none  Covid status: xax x 4  Rec We can refer you to pulmonary rehab at [facility] if you wish  Continue Pantoprazole (protonix) 40 mg   Take  30-60 min before first meal of the day and Pepcid (famotidine)  20 mg after supper until return to office Plan A = Automatic = Always=  Stiolto 2 puffs first thing in am followed by Flovent 220 x 2 puffs repeat in 12hours Work on inhaler technique:    Plan B = Backup (to supplement plan A, not to replace it) Only use your albuterol inhaler as a rescue medication Plan C = Crisis (instead of Plan B but only if Plan B stops working) - only use your albuterol nebulizer if you first try Plan B  Plan D = Doctor -if ABC not working > Prednisone 10 mg take  4 each am x 2 days,   2 each am x 2 days,  1 each am x 2 days and stop  Please schedule a follow up office visit in 4 weeks, sooner if needed  with all medications /inhalers/ solutions in  hand    09/29/2022  f/u ov/ office/[name] re: *** maint on ***  No chief complaint on file.   Dyspnea:  *** Cough: *** Sleeping: *** SABA use: *** 02: *** Covid status: *** Lung cancer screening: ***   No obvious day to day or daytime variability or assoc excess/ purulent sputum or mucus plugs or hemoptysis or cp or chest tightness, subjective wheeze or overt sinus or hb symptoms.   *** without nocturnal  or early am exacerbation  of respiratory  c/o's or need for noct saba. Also denies any obvious fluctuation of symptoms with weather or environmental changes or other aggravating or alleviating factors except as outlined above   No unusual exposure hx or h/o childhood pna/ asthma or knowledge of premature birth.  Current Allergies, Complete Past Medical History, Past Surgical History, Family History, and Social History were reviewed in [employer] record.  ROS  The following are not active complaints unless bolded Hoarseness, sore throat, dysphagia, dental problems, itching, sneezing,  nasal congestion or discharge of excess mucus or purulent secretions, ear ache,   fever, chills, sweats, unintended wt loss or wt gain, classically pleuritic or exertional cp,  orthopnea pnd or arm/hand swelling  or leg swelling, presyncope, palpitations, abdominal pain, anorexia, nausea, vomiting, diarrhea  or change in bowel habits or change in bladder habits, change in stools or change in urine, dysuria, hematuria,  rash, arthralgias, visual complaints, headache, numbness, weakness or ataxia or problems with walking or coordination,  change in mood or  memory.        No outpatient medications have been marked as taking for the 09/29/22 encounter (Appointment) with Tanda Rockers, MD.                                                  Past Medical History:  Diagnosis Date   Chest discomfort    negative stress echocardiogram-01/2011   Chronic bronchitis    COPD  (chronic obstructive pulmonary disease) ([place])    DJD (degenerative joint disease)  Dyspnea    Gastro-esophageal reflux disease with esophagitis    GERD (gastroesophageal reflux disease)    Hyperlipidemia    Hypertension    Migraine    PVC (premature ventricular contraction)    Frequent   Tobacco abuse    Tobacco abuse    40 pack years       Objective:    Wts  09/29/2022       ***  09/01/2022        172  06/09/2022        176 12/16/2021      183  05/05/2021        192  01/28/2021         203 11/06/2020      200  09/08/2020       195   05/09/20 197 lb (89.4 kg)  04/30/20 197 lb (89.4 kg)  04/09/20 197 lb (89.4 kg)    Vital signs reviewed  09/29/2022  - Note at rest 02 sats  ***% on ***   General appearance:    ***     Mod bar***                   Assessment

## 2022-09-29 NOTE — Patient Instructions (Addendum)
Continue pantoprazole 40 mg Take 30-60 min before first meal of the day   My office will be contacting you by phone for referral to lung cancer screening program  - if you don't hear back from my office within one week please call us back or notify us thru MyChart and we'll address it right away.   Ok to try albuterol 15 min before an activity (on alternating days between inhaler or nebulizer )  that you know would usually make you short of breath (walking at grocery store) and see if it makes any difference and if makes none then don't take albuterol after activity unless you can't catch your breath as this means it's the resting that helps, not the albuterol.  If inhaler / neb not helping and getting worse > Prednisone 10 mg take  4 each am x 2 days,   2 each am x 2 days,  1 each am x 2 days and stop (refillable prescription)   Please schedule a follow up visit in 6  months but call sooner if needed

## 2022-09-29 NOTE — Assessment & Plan Note (Addendum)
Quit in 2017 > referred for lung cancer screening 09/29/2022   Low-dose CT lung cancer screening is recommended for patients who are 29-68 years of age with a 20+ pack-year history of smoking and who are currently smoking or quit <=15 years ago. No coughing up blood  No unintentional weight loss of > 15 pounds in the last 6 months - pt is eligible for scanning yearly until 2032         Each maintenance medication was reviewed in detail including emphasizing most importantly the difference between maintenance and prns and under what circumstances the prns are to be triggered using an action plan format where appropriate.  Total time for H and P, chart review, counseling, reviewing hfa device(s) and generating customized AVS unique to this office visit / same day charting =30 min

## 2022-09-29 NOTE — Assessment & Plan Note (Addendum)
Quit smoking 2017  - Spirometry  01/04/17  FEV1 0.99 (23%)  Ratio 0.28 p ? Prior  = Allergy profile 04/09/20  >  Eos 0. 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 04/09/2020  MM   Level 156  - 04/09/2020 rec pred 20 until better then 10 mg daily x one week wean - 05/09/2020  After extensive coaching inhaler device,  effectiveness =    90% > rec  continue flovent 220/ stiolto and completely  taper off prednisone  - PFT's  06/24/20  FEV1 1.15 (26 % ) ratio 0.27  p 35 % improvement from saba p stiolto prior to study with DLCO  11.45 (35%) corrects to 1.88 (46%)  for alv volume and FV curve classic curvature  - 09/08/2020  After extensive coaching inhaler device,  effectiveness =    90% so try breztri  - 10/09/2020  After extensive coaching inhaler device,  effectiveness =    90% with smi > change back to stiolto and pred ceiling 20/floor 10 and added protonix 40 mg q am ac as pseudowheeze on exam - 11/06/2020 added pm doses of pepcid/ gerd recs including bed blocks   - .11/06/2020 pred floor of 10 mg / ceiling of 20 mg -  11/06/2020   Walked RA  approx   250 ft  @ avg pace  stopped due to  Sob/ fatigue with sats still 96%  Typical of a pink puffer  -  12/15/2020  After extensive coaching inhaler device,  effectiveness =    90%  -  01/28/2021   Walked RA  approx   400 ft  @ slow to moderate pace  stopped due to  Sob  With sats 94% - 05/05/2021 resumed prednisone  D  2 a day until better then 1 a day x 5 days and stop. - 06/09/2022  continue stiolto  Plus flovent 220 2bid  - 09/01/2022  After extensive coaching inhaler device,  effectiveness =    90% with smi, 50% with hfa  - 09/01/2022 added back Prednisone as plan D = 6 day taper  - 09/01/2022   Walked on RA  x  1  lap(s) =  approx 150  ft  @ moderate pace, stopped due to sob with lowest 02 sats 95%   - 09/29/2022 refused walk    Group D (now reclassified as E) in terms of symptom/risk and laba/lama/ICS  therefore appropriate rx at this point >>>  Stiolto/flovent and  approp saba as PLan BC and pred x 6 days as plan D, reviewed   f/u q 6 m, call sooner prn

## 2022-10-11 ENCOUNTER — Ambulatory Visit: Payer: Medicare Other | Admitting: Internal Medicine

## 2022-10-11 ENCOUNTER — Encounter: Payer: Self-pay | Admitting: Internal Medicine

## 2022-10-25 ENCOUNTER — Encounter: Payer: Self-pay | Admitting: Internal Medicine

## 2022-10-25 ENCOUNTER — Ambulatory Visit (INDEPENDENT_AMBULATORY_CARE_PROVIDER_SITE_OTHER): Payer: Medicare Other | Admitting: Internal Medicine

## 2022-10-25 VITALS — BP 111/70 | HR 81 | Ht 77.0 in | Wt 177.0 lb

## 2022-10-25 DIAGNOSIS — G2581 Restless legs syndrome: Secondary | ICD-10-CM

## 2022-10-25 DIAGNOSIS — M62838 Other muscle spasm: Secondary | ICD-10-CM

## 2022-10-25 DIAGNOSIS — J42 Unspecified chronic bronchitis: Secondary | ICD-10-CM | POA: Diagnosis not present

## 2022-10-25 DIAGNOSIS — N1832 Chronic kidney disease, stage 3b: Secondary | ICD-10-CM | POA: Diagnosis not present

## 2022-10-25 DIAGNOSIS — I10 Essential (primary) hypertension: Secondary | ICD-10-CM | POA: Diagnosis not present

## 2022-10-25 DIAGNOSIS — Z1211 Encounter for screening for malignant neoplasm of colon: Secondary | ICD-10-CM | POA: Diagnosis not present

## 2022-10-25 MED ORDER — CYCLOBENZAPRINE HCL 10 MG PO TABS: ORAL_TABLET | ORAL | 2 refills | Status: DC

## 2022-10-25 NOTE — Patient Instructions (Addendum)
Please take Flexeril - Cyclobenzaprine as needed for leg cramps.  Please continue taking medications as prescribed.  Please continue to follow low salt diet and ambulate as tolerated.  Please maintain at least 64 ounces of fluid intake.  Please consider getting Shingrix vaccines at local pharmacy.

## 2022-10-25 NOTE — Progress Notes (Signed)
Established Patient Office Visit  Subjective:  Patient ID: Scott Hubbard, male    DOB: 1956-04-28  Age: 66 y.o. MRN: [id number]  CC:  Chief Complaint  Patient presents with   Follow-up    Follow up leg cramps.    HPI Scott Hubbard is a 66 y.o. male with past medical history of COPD, hypertension, CKD and previous tobacco abuse who presents for f/u of his chronic medical conditions.  He complains of chronic leg cramps and forearm cramps, for which he has been taking Ropinirole with some relief.  He also reports urge to move his legs at nighttime.  HTN: BP is well-controlled. Takes medications regularly. Patient denies headache, dizziness, or palpitations.   COPD: He complains of chronic cough and dyspnea, for which he uses Stiolto and as needed albuterol neb and inhaler.  Followed by Dr. Melvyn Novas.  He is not on oral steroids now.  He was having episodes of chest pain and was referred to cardiology as his chest pain has resolved now.  CKD: His S. Cr. and GFR had been improving in the last BMP.  He currently denies any dysuria or hematuria.  He has tried to increase his fluid intake.   Past Medical History:  Diagnosis Date   Chest discomfort    negative stress echocardiogram-01/2011   Chronic bronchitis    COPD (chronic obstructive pulmonary disease) ([facility])    DJD (degenerative joint disease)    Gastro-esophageal reflux disease with esophagitis    GERD (gastroesophageal reflux disease)    Hyperlipidemia    Hypertension    Migraine    PVC (premature ventricular contraction)    Frequent   Tobacco abuse    40 pack years    Past Surgical History:  Procedure Laterality Date   None      History reviewed. No pertinent family history.  Social History   Socioeconomic History   Marital status: Legally Separated    Spouse name: Not on file   Number of children: 2   Years of education: Not on file   Highest education level: Not on file  Occupational History   Occupation:  unemployed    Employer: UNEMPLOYED  Tobacco Use   Smoking status: Former    Packs/day: 1.50    Years: 40.00    Total pack years: 60.00    Types: Cigarettes    Quit date: 04/27/2016    Years since quitting: 6.4   Smokeless tobacco: Never  Vaping Use   Vaping Use: Never used  Substance and Sexual Activity   Alcohol use: No   Drug use: No   Sexual activity: Not on file  Other Topics Concern   Not on file  Social History Narrative   Lives with girlfriend.  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     Social Determinants of Health   Financial Resource Strain: Low Risk  (09/06/2022)   Overall Financial Resource Strain (CARDIA)    Difficulty of Paying Living Expenses: Not hard at all  Food Insecurity: No Food Insecurity (09/06/2022)   Hunger Vital Sign    Worried About Running Out of Food in the Last Year: Never true    Ran Out of Food in the Last Year: Never true  Transportation Needs: No Transportation Needs (09/06/2022)   PRAPARE - Hydrologist (Medical): No    Lack of Transportation (Non-Medical): No  Physical Activity: Sufficiently Active (09/06/2022)   Exercise Vital Sign    Days of Exercise per Week:  5 days    Minutes of Exercise per Session: 30 min  Stress: No Stress Concern Present (09/06/2022)   [place]    Feeling of Stress : Not at all  Social Connections: Moderately Integrated (09/06/2022)   Social Connection and Isolation Panel [NHANES]    Frequency of Communication with Friends and Family: More than three times a week    Frequency of Social Gatherings with Friends and Family: Twice a week    Attends Religious Services: 1 to 4 times per year    Active Member of [employer] or Organizations: No    Attends Archivist Meetings: Never    Marital Status: Living with partner  Intimate Partner Violence: Not At Risk (09/06/2022)   Humiliation, Afraid, Rape, and Kick questionnaire     Fear of Current or Ex-Partner: No    Emotionally Abused: No    Physically Abused: No    Sexually Abused: No    Outpatient Medications Prior to Visit  Medication Sig Dispense Refill   acetaminophen (TYLENOL) 500 MG tablet Take 1,000 mg by mouth every 6 (six) hours as needed.     albuterol (PROAIR HFA) 108 (90 Base) MCG/ACT inhaler 2 puffs every 4 hours as needed only  if your can't catch your breath 18 g 11   albuterol (PROVENTIL) (2.5 MG/3ML) 0.083% nebulizer solution Take 3 mLs (2.5 mg total) by nebulization every 4 (four) hours as needed for wheezing or shortness of breath. 75 mL 12   famotidine (PEPCID) 20 MG tablet One after  supper     FLOVENT HFA 220 MCG/ACT inhaler TAKE 2 PUFFS BY MOUTH TWICE A DAY 36 each 3   pantoprazole (PROTONIX) 40 MG tablet Take 30-60 min before first meal of the day 30 tablet 2   predniSONE (DELTASONE) 10 MG tablet Take  4 each am x 2 days,   2 each am x 2 days,  1 each am x 2 days and stop 14 tablet 11   rOPINIRole (REQUIP) 1 MG tablet TAKE 1 TABLET BY MOUTH AT BEDTIME. 90 tablet 1   Tiotropium Bromide-Olodaterol (STIOLTO RESPIMAT) 2.5-2.5 MCG/ACT AERS INHALE 2 PUFFS BY MOUTH INTO THE LUNGS DAILY 4 g 11   triamcinolone cream (KENALOG) 0.1 % APPLY TO AFFECTED AREA TWICE A DAY 30 g 0   valsartan-hydrochlorothiazide (DIOVAN-HCT) 80-12.5 MG tablet TAKE 1 TABLET BY MOUTH EVERY DAY 90 tablet 3   cyclobenzaprine (FLEXERIL) 10 MG tablet TAKE 1 TABLET BY MOUTH TWICE A DAY AS NEEDED FOR MUSCLE SPASMS 60 tablet 2   No facility-administered medications prior to visit.    No Known Allergies  ROS Review of Systems  Constitutional:  Negative for chills and fever.  HENT:  Negative for sinus pressure, sinus pain and sore throat.   Eyes:  Negative for pain and discharge.  Respiratory:  Positive for cough, shortness of breath (chronic) and wheezing.   Cardiovascular:  Negative for chest pain and palpitations.  Gastrointestinal:  Negative for constipation, diarrhea,  nausea and vomiting.  Endocrine: Negative for polydipsia and polyuria.  Genitourinary:  Negative for dysuria and hematuria.  Musculoskeletal:  Negative for neck pain and neck stiffness.  Skin:  Positive for rash (b/l UE).  Neurological:  Negative for dizziness, weakness, numbness and headaches.  Psychiatric/Behavioral:  Negative for agitation and behavioral problems.       Objective:    Physical Exam Vitals reviewed.  Constitutional:      General: He is not in acute  distress.    Appearance: He is not diaphoretic.  HENT:     Head: Normocephalic and atraumatic.     Mouth/Throat:     Mouth: Mucous membranes are moist.  Eyes:     General: No scleral icterus.    Extraocular Movements: Extraocular movements intact.  Cardiovascular:     Rate and Rhythm: Normal rate and regular rhythm.     Pulses: Normal pulses.     Heart sounds: Normal heart sounds. No murmur heard.    No friction rub. No gallop.  Pulmonary:     Effort: Pulmonary effort is normal.     Breath sounds: No wheezing or rales.  Abdominal:     General: Bowel sounds are normal.     Palpations: Abdomen is soft.     Tenderness: There is no abdominal tenderness.     Hernia: A hernia (Umbilical) is present.  Musculoskeletal:        General: No swelling or signs of injury.     Cervical back: Neck supple. No tenderness.     Right lower leg: No edema.     Left lower leg: No edema.  Skin:    General: Skin is warm.     Findings: Rash (Erythematous plaques over b/l UE) present.  Neurological:     General: No focal deficit present.     Mental Status: He is alert and oriented to person, place, and time.     Sensory: No sensory deficit.     Motor: No weakness.  Psychiatric:        Mood and Affect: Mood normal.        Behavior: Behavior normal.     BP 111/70 (BP Location: Right Arm, Patient Position: Sitting, Cuff Size: Normal)   Pulse 81   Ht 6' 5" (1.956 m)   Wt 177 lb (80.3 kg)   SpO2 93%   BMI 20.99 kg/m  Wt  Readings from Last 3 Encounters:  10/25/22 177 lb (80.3 kg)  09/29/22 176 lb 12.8 oz (80.2 kg)  09/01/22 172 lb 6.4 oz (78.2 kg)    Lab Results  Component Value Date   TSH 0.693 06/09/2022   Lab Results  Component Value Date   WBC 11.7 (H) 06/09/2022   HGB 13.2 06/09/2022   HCT 40.0 06/09/2022   MCV 88 06/09/2022   PLT 190 06/09/2022   Lab Results  Component Value Date   NA 135 06/09/2022   K 5.3 (H) 06/09/2022   CO2 25 06/09/2022   GLUCOSE 112 (H) 06/09/2022   BUN 22 06/09/2022   CREATININE 1.84 (H) 06/09/2022   BILITOT 0.5 06/09/2022   ALKPHOS 71 06/09/2022   AST 16 06/09/2022   ALT 11 06/09/2022   PROT 6.7 06/09/2022   ALBUMIN 4.4 06/09/2022   CALCIUM 9.1 06/09/2022   ANIONGAP 13 03/11/2022   EGFR 39 (L) 06/09/2022   Lab Results  Component Value Date   CHOL 154 06/09/2022   Lab Results  Component Value Date   HDL 35 (L) 06/09/2022   Lab Results  Component Value Date   LDLCALC 103 (H) 06/09/2022   Lab Results  Component Value Date   TRIG 84 06/09/2022   Lab Results  Component Value Date   CHOLHDL 4.4 06/09/2022   Lab Results  Component Value Date   HGBA1C 5.7 (H) 06/09/2022      Assessment & Plan:   Problem List Items Addressed This Visit       Cardiovascular and Mediastinum   Essential  hypertension    BP Readings from Last 1 Encounters:  10/25/22 111/70  Well-controlled with Valsartan-HCTZ Counseled for compliance with the medications Advised DASH diet and moderate exercise/walking as tolerated        Respiratory   Chronic bronchitis ([place]) - Primary    Quit smoking in 2017 On Stiolto Albuterol nebs and inhaler PRN Follows up with Dr Melvyn Novas  Needs low-dose CT chest for lung cancer screening, but he wants to think about it. Also denies flu and pneumococcal vaccine.        Genitourinary   Stage 3b chronic kidney disease ([place])    AKI on CKD in the past On ARB and HCTZ Avoid nephrotoxic agents Reviewed US renal from ER Check  BMP today      Relevant Orders   Basic Metabolic Panel (BMET)   Urine Microalbumin w/creat. ratio     Other   Restless legs syndrome    Uncontrolled with Requip 1 mg qHS Added Flexeril PRN for leg cramps      Relevant Medications   cyclobenzaprine (FLEXERIL) 10 MG tablet   Other Visit Diagnoses     Muscle spasms of both lower extremities       Relevant Medications   cyclobenzaprine (FLEXERIL) 10 MG tablet   Screening for colon cancer       Relevant Orders   Cologuard       Meds ordered this encounter  Medications   cyclobenzaprine (FLEXERIL) 10 MG tablet    Sig: TAKE 1 TABLET BY MOUTH TWICE A DAY AS NEEDED FOR MUSCLE SPASMS    Dispense:  60 tablet    Refill:  2    Follow-up: Return in about 4 months (around 02/23/2023) for CKD.    Lindell Spar, MD

## 2022-10-25 NOTE — Assessment & Plan Note (Signed)
BP Readings from Last 1 Encounters:  10/25/22 111/70   Well-controlled with Valsartan-HCTZ Counseled for compliance with the medications Advised DASH diet and moderate exercise/walking as tolerated

## 2022-10-25 NOTE — Assessment & Plan Note (Signed)
AKI on CKD in the past On ARB and HCTZ Avoid nephrotoxic agents Reviewed US renal from ER Check BMP today

## 2022-10-25 NOTE — Assessment & Plan Note (Signed)
Uncontrolled with Requip 1 mg qHS Added Flexeril PRN for leg cramps

## 2022-10-25 NOTE — Assessment & Plan Note (Signed)
Quit smoking in 2017 On Stiolto Albuterol nebs and inhaler PRN Follows up with Dr Melvyn Novas  Needs low-dose CT chest for lung cancer screening, but he wants to think about it. Also denies flu and pneumococcal vaccine.

## 2022-10-27 LAB — MICROALBUMIN / CREATININE URINE RATIO
Creatinine, Urine: 119.4 mg/dL
Microalb/Creat Ratio: <3 mg/g creat (ref 0–29)
Microalbumin, Urine: <3 ug/mL

## 2022-10-27 LAB — BASIC METABOLIC PANEL
BUN/Creatinine Ratio: 16 (ref 10–24)
BUN: 27 mg/dL (ref 8–27)
CO2: 28 mmol/L (ref 20–29)
Calcium: 9.3 mg/dL (ref 8.6–10.2)
Chloride: 98 mmol/L (ref 96–106)
Creatinine, Ser: 1.74 mg/dL — ABNORMAL HIGH (ref 0.76–1.27)
Glucose: 131 mg/dL — ABNORMAL HIGH (ref 70–99)
Potassium: 5.2 mmol/L (ref 3.5–5.2)
Sodium: 140 mmol/L (ref 134–144)
eGFR: 42 mL/min/{1.73_m2} — ABNORMAL LOW (ref >59)

## 2022-11-03 DIAGNOSIS — Z1211 Encounter for screening for malignant neoplasm of colon: Secondary | ICD-10-CM | POA: Diagnosis not present

## 2022-11-10 ENCOUNTER — Other Ambulatory Visit: Payer: Self-pay | Admitting: *Deleted

## 2022-11-10 MED ORDER — FAMOTIDINE 20 MG PO TABS: 20.0000 mg | ORAL_TABLET | Freq: Every day | ORAL | 3 refills | Status: DC

## 2022-11-12 LAB — COLOGUARD: COLOGUARD: POSITIVE — AB

## 2022-11-14 ENCOUNTER — Other Ambulatory Visit: Payer: Self-pay | Admitting: Internal Medicine

## 2022-11-14 DIAGNOSIS — R195 Other fecal abnormalities: Secondary | ICD-10-CM | POA: Insufficient documentation

## 2022-11-15 ENCOUNTER — Encounter: Payer: Self-pay | Admitting: Internal Medicine

## 2022-11-18 DIAGNOSIS — J441 Chronic obstructive pulmonary disease with (acute) exacerbation: Secondary | ICD-10-CM | POA: Diagnosis not present

## 2022-12-06 DIAGNOSIS — L03119 Cellulitis of unspecified part of limb: Secondary | ICD-10-CM | POA: Diagnosis not present

## 2022-12-06 DIAGNOSIS — J441 Chronic obstructive pulmonary disease with (acute) exacerbation: Secondary | ICD-10-CM | POA: Diagnosis not present

## 2022-12-22 ENCOUNTER — Telehealth: Payer: Self-pay | Admitting: *Deleted

## 2022-12-22 ENCOUNTER — Ambulatory Visit (INDEPENDENT_AMBULATORY_CARE_PROVIDER_SITE_OTHER): Payer: 59 | Admitting: Internal Medicine

## 2022-12-22 ENCOUNTER — Encounter: Payer: Self-pay | Admitting: *Deleted

## 2022-12-22 ENCOUNTER — Encounter: Payer: Self-pay | Admitting: Internal Medicine

## 2022-12-22 VITALS — BP 110/72 | HR 98 | Temp 97.5°F | Ht 77.0 in | Wt 172.1 lb

## 2022-12-22 DIAGNOSIS — K219 Gastro-esophageal reflux disease without esophagitis: Secondary | ICD-10-CM

## 2022-12-22 DIAGNOSIS — R195 Other fecal abnormalities: Secondary | ICD-10-CM | POA: Diagnosis not present

## 2022-12-22 MED ORDER — PEG 3350-KCL-NA BICARB-NACL 420 G PO SOLR: 4000.0000 mL | Freq: Once | ORAL | 0 refills | Status: AC

## 2022-12-22 NOTE — Telephone Encounter (Signed)
PA approved via [facility]. Auth# [id number], DOS: Dec 31, 2022 - Mar 31, 2023

## 2022-12-22 NOTE — Patient Instructions (Signed)
We will schedule you for colonoscopy given your recent positive Cologuard testing.  Continue on pantoprazole for your chronic reflux.  It was very nice meeting both you today.  Dr. Abbey Chatters

## 2022-12-22 NOTE — Addendum Note (Signed)
Addended by: Cheron Every on: 12/22/2022 03:11 PM   Modules accepted: Orders

## 2022-12-22 NOTE — Progress Notes (Signed)
Primary Care Physician:  Lindell Spar, MD Primary Gastroenterologist:  Dr. Abbey Chatters  Chief Complaint  Patient presents with   positive cologaurd    Patient here today due to having a positive colo guard test. Patient denies any current Gi issues.    HPI:   Scott Hubbard is a 67 y.o. male who presents to clinic today by referral from his PCP Dr. Posey Pronto for evaluation.  Recently had positive Cologuard test.  No family history of colorectal malignancy.  No melena hematochezia.  Does note 7 pound weight loss due to poor appetite.  No abdominal pain.  Has chronic reflux which is well-controlled on pantoprazole daily.  Denies any dysphagia odynophagia.  No epigastric or chest pain.  Does have significant COPD on multiple inhalers. Follows with Dr. Melvyn Novas of Pulmonary. Quit smoking 2017    Past Medical History:  Diagnosis Date   Chest discomfort    negative stress echocardiogram-01/2011   Chronic bronchitis    COPD (chronic obstructive pulmonary disease) (HCC)    DJD (degenerative joint disease)    Gastro-esophageal reflux disease with esophagitis    GERD (gastroesophageal reflux disease)    Hyperlipidemia    Hypertension    Migraine    PVC (premature ventricular contraction)    Frequent   Tobacco abuse    40 pack years    Past Surgical History:  Procedure Laterality Date   None      Current Outpatient Medications  Medication Sig Dispense Refill   acetaminophen (TYLENOL) 500 MG tablet Take 1,000 mg by mouth every 6 (six) hours as needed.     albuterol (PROAIR HFA) 108 (90 Base) MCG/ACT inhaler 2 puffs every 4 hours as needed only  if your can't catch your breath 18 g 11   cyclobenzaprine (FLEXERIL) 10 MG tablet TAKE 1 TABLET BY MOUTH TWICE A DAY AS NEEDED FOR MUSCLE SPASMS 60 tablet 2   famotidine (PEPCID) 20 MG tablet Take 1 tablet (20 mg total) by mouth daily after supper. 90 tablet 3   FLOVENT HFA 220 MCG/ACT inhaler TAKE 2 PUFFS BY MOUTH TWICE A DAY 36 each 3    pantoprazole (PROTONIX) 40 MG tablet Take 30-60 min before first meal of the day 30 tablet 2   predniSONE (DELTASONE) 10 MG tablet Take  4 each am x 2 days,   2 each am x 2 days,  1 each am x 2 days and stop 14 tablet 11   rOPINIRole (REQUIP) 1 MG tablet TAKE 1 TABLET BY MOUTH AT BEDTIME. 90 tablet 1   Tiotropium Bromide-Olodaterol (STIOLTO RESPIMAT) 2.5-2.5 MCG/ACT AERS INHALE 2 PUFFS BY MOUTH INTO THE LUNGS DAILY 4 g 11   valsartan-hydrochlorothiazide (DIOVAN-HCT) 80-12.5 MG tablet TAKE 1 TABLET BY MOUTH EVERY DAY 90 tablet 3   No current facility-administered medications for this visit.    Allergies as of 12/22/2022   (No Known Allergies)    History reviewed. No pertinent family history.  Social History   Socioeconomic History   Marital status: Legally Separated    Spouse name: Not on file   Number of children: 2   Years of education: Not on file   Highest education level: Not on file  Occupational History   Occupation: unemployed    Employer: UNEMPLOYED  Tobacco Use   Smoking status: Former    Packs/day: 1.50    Years: 40.00    Total pack years: 60.00    Types: Cigarettes    Quit date: 04/27/2016  Years since quitting: 6.6   Smokeless tobacco: Never  Vaping Use   Vaping Use: Never used  Substance and Sexual Activity   Alcohol use: No   Drug use: No   Sexual activity: Not on file  Other Topics Concern   Not on file  Social History Narrative   Lives with girlfriend.  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     Social Determinants of Health   Financial Resource Strain: Low Risk  (09/06/2022)   Overall Financial Resource Strain (CARDIA)    Difficulty of Paying Living Expenses: Not hard at all  Food Insecurity: No Food Insecurity (09/06/2022)   Hunger Vital Sign    Worried About Running Out of Food in the Last Year: Never true    Ran Out of Food in the Last Year: Never true  Transportation Needs: No Transportation Needs (09/06/2022)   PRAPARE - Radiographer, therapeutic (Medical): No    Lack of Transportation (Non-Medical): No  Physical Activity: Sufficiently Active (09/06/2022)   Exercise Vital Sign    Days of Exercise per Week: 5 days    Minutes of Exercise per Session: 30 min  Stress: No Stress Concern Present (09/06/2022)   [place]    Feeling of Stress : Not at all  Social Connections: Moderately Integrated (09/06/2022)   Social Connection and Isolation Panel [NHANES]    Frequency of Communication with Friends and Family: More than three times a week    Frequency of Social Gatherings with Friends and Family: Twice a week    Attends Religious Services: 1 to 4 times per year    Active Member of [employer] or Organizations: No    Attends Archivist Meetings: Never    Marital Status: Living with partner  Intimate Partner Violence: Not At Risk (09/06/2022)   Humiliation, Afraid, Rape, and Kick questionnaire    Fear of Current or Ex-Partner: No    Emotionally Abused: No    Physically Abused: No    Sexually Abused: No    Subjective: Review of Systems  Constitutional:  Negative for chills and fever.  HENT:  Negative for congestion and hearing loss.   Eyes:  Negative for blurred vision and double vision.  Respiratory:  Negative for cough and shortness of breath.   Cardiovascular:  Negative for chest pain and palpitations.  Gastrointestinal:  Positive for heartburn. Negative for abdominal pain, blood in stool, constipation, diarrhea, melena and vomiting.  Genitourinary:  Negative for dysuria and urgency.  Musculoskeletal:  Negative for joint pain and myalgias.  Skin:  Negative for itching and rash.  Neurological:  Negative for dizziness and headaches.  Psychiatric/Behavioral:  Negative for depression. The patient is not nervous/anxious.        Objective: BP 110/72 (BP Location: Left Arm, Patient Position: Sitting, Cuff Size: Small)   Pulse 98   Temp (!) 97.5  F (36.4 C) (Temporal)   Ht '6\' 5"'$  (1.956 m)   Wt 172 lb 1.6 oz (78.1 kg)   BMI 20.41 kg/m  Physical Exam Constitutional:      Appearance: Normal appearance.  HENT:     Head: Normocephalic and atraumatic.  Eyes:     Extraocular Movements: Extraocular movements intact.     Conjunctiva/sclera: Conjunctivae normal.  Cardiovascular:     Rate and Rhythm: Normal rate and regular rhythm.  Pulmonary:     Effort: Pulmonary effort is normal.     Breath sounds: Wheezing present.  Abdominal:     General: Bowel sounds are normal.     Palpations: Abdomen is soft.  Musculoskeletal:        General: Normal range of motion.     Cervical back: Normal range of motion and neck supple.  Skin:    General: Skin is warm.  Neurological:     General: No focal deficit present.     Mental Status: He is alert and oriented to person, place, and time.  Psychiatric:        Mood and Affect: Mood normal.        Behavior: Behavior normal.      Assessment: *Chronic GERD-well-controlled on pantoprazole daily *Positive Cologuard testing  Plan: Chronic GERD well-controlled pantoprazole daily.  We will continue.  Will schedule for screening colonoscopy.The risks including infection, bleed, or perforation as well as benefits, limitations, alternatives and imponderables have been reviewed with the patient. Questions have been answered. All parties agreeable.  Thank you Dr. Posey Pronto for the kind referral.   12/22/2022 2:28 PM   Disclaimer: This note was dictated with voice recognition software. Similar sounding words can inadvertently be transcribed and may not be corrected upon review.

## 2022-12-23 ENCOUNTER — Telehealth (INDEPENDENT_AMBULATORY_CARE_PROVIDER_SITE_OTHER): Payer: Self-pay | Admitting: *Deleted

## 2022-12-23 NOTE — Telephone Encounter (Signed)
Called pt several times and no answer and not able to leave VM to give pre-op appt details

## 2022-12-23 NOTE — Telephone Encounter (Signed)
Called pt and is aware of pre-op appt details.

## 2022-12-24 DIAGNOSIS — R300 Dysuria: Secondary | ICD-10-CM | POA: Diagnosis not present

## 2022-12-24 DIAGNOSIS — R339 Retention of urine, unspecified: Secondary | ICD-10-CM | POA: Diagnosis not present

## 2022-12-24 DIAGNOSIS — Z681 Body mass index (BMI) 19 or less, adult: Secondary | ICD-10-CM | POA: Diagnosis not present

## 2022-12-29 ENCOUNTER — Telehealth: Payer: Self-pay | Admitting: Internal Medicine

## 2022-12-29 NOTE — Telephone Encounter (Signed)
Spoke with pt. He rescheduled to 1/29 at 12pm. Aware will send new instructions and will call back with new pre-op appt. He voiced understanding.

## 2022-12-29 NOTE — Telephone Encounter (Signed)
Previsit instructions would come from the [facility] doing the colonoscopy

## 2022-12-29 NOTE — Telephone Encounter (Signed)
I called patient to go over instructions and he told me he wanted to reschedule colonoscopy because he is not feeling well. He said he was dizzy and off balance and does not feel good. Has not gotten out of bed today. I let him know colonoscopy would be rescheduled and he should call his pcp to discuss his symptoms.

## 2022-12-29 NOTE — Telephone Encounter (Signed)
Patient is having colonoscopy on Friday. Wants a call back with instructions on what to do and or not to do until procedure.

## 2022-12-30 ENCOUNTER — Other Ambulatory Visit: Payer: Self-pay

## 2022-12-30 ENCOUNTER — Encounter (HOSPITAL_COMMUNITY): Admission: RE | Admit: 2022-12-30 | Payer: 59 | Source: Ambulatory Visit

## 2022-12-30 ENCOUNTER — Encounter: Payer: Self-pay | Admitting: *Deleted

## 2022-12-30 MED ORDER — STIOLTO RESPIMAT 2.5-2.5 MCG/ACT IN AERS: 2.0000 | INHALATION_SPRAY | Freq: Every day | RESPIRATORY_TRACT | 0 refills | Status: DC

## 2022-12-31 ENCOUNTER — Telehealth: Payer: Self-pay | Admitting: *Deleted

## 2022-12-31 NOTE — Telephone Encounter (Signed)
Pt called and gave info regarding pre-op date. Verbalized understanding

## 2023-01-12 NOTE — Patient Instructions (Signed)
Scott Hubbard  01/12/2023     '@PREFPERIOPPHARMACY'$ @   Your procedure is scheduled on  01/17/2023.   Report to [facility] at  [place].   Call this number if you have problems the morning of surgery:  [phone]  If you experience any cold or flu symptoms such as cough, fever, chills, shortness of breath, etc. between now and your scheduled surgery, please notify us at the above number.   Remember:  Follow the diet and prep instructions given to you by the office.     Take these medicines the morning of surgery with A SIP OF WATER                   flexeril, pepcid, pantoprazole.         Use your inhaler before you come and bring your rescue inhaler with you.    Do not wear jewelry, make-up or nail polish.  Do not wear lotions, powders, or perfumes, or deodorant.  Do not shave 48 hours prior to surgery.  Men may shave face and neck.  Do not bring valuables to the hospital.  [facility] is not responsible for any belongings or valuables.  Contacts, dentures or bridgework may not be worn into surgery.  Leave your suitcase in the car.  After surgery it may be brought to your room.  For patients admitted to the hospital, discharge time will be determined by your treatment team.  Patients discharged the day of surgery will not be allowed to drive home and must have someone with them for 24 hours.    Special instructions:   DO NOT smoke tobacco or vape for 24 hours before your procedure.  Please read over the following fact sheets that you were given. Anesthesia Post-op Instructions and Care and Recovery After Surgery      Colonoscopy, Adult, Care After The following information offers guidance on how to care for yourself after your procedure. Your health care provider may also give you more specific instructions. If you have problems or questions, contact your health care provider. What can I expect after the procedure? After the procedure, it is common to  have: A small amount of blood in your stool for 24 hours after the procedure. Some gas. Mild cramping or bloating of your abdomen. Follow these instructions at home: Eating and drinking  Drink enough fluid to keep your urine pale yellow. Follow instructions from your health care provider about eating or drinking restrictions. Resume your normal diet as told by your health care provider. Avoid heavy or fried foods that are hard to digest. Activity Rest as told by your health care provider. Avoid sitting for a long time without moving. Get up to take short walks every 1-2 hours. This is important to improve blood flow and breathing. Ask for help if you feel weak or unsteady. Return to your normal activities as told by your health care provider. Ask your health care provider what activities are safe for you. Managing cramping and bloating  Try walking around when you have cramps or feel bloated. If directed, apply heat to your abdomen as told by your health care provider. Use the heat source that your health care provider recommends, such as a moist heat pack or a heating pad. Place a towel between your skin and the heat source. Leave the heat on for 20-30 minutes. Remove the heat if your skin turns bright red. This is especially important  if you are unable to feel pain, heat, or cold. You have a greater risk of getting burned. General instructions If you were given a sedative during the procedure, it can affect you for several hours. Do not drive or operate machinery until your health care provider says that it is safe. For the first 24 hours after the procedure: Do not sign important documents. Do not drink alcohol. Do your regular daily activities at a slower pace than normal. Eat soft foods that are easy to digest. Take over-the-counter and prescription medicines only as told by your health care provider. Keep all follow-up visits. This is important. Contact a health care provider  if: You have blood in your stool 2-3 days after the procedure. Get help right away if: You have more than a small spotting of blood in your stool. You have large blood clots in your stool. You have swelling of your abdomen. You have nausea or vomiting. You have a fever. You have increasing pain in your abdomen that is not relieved with medicine. These symptoms may be an emergency. Get help right away. Call 911. Do not wait to see if the symptoms will go away. Do not drive yourself to the hospital. Summary After the procedure, it is common to have a small amount of blood in your stool. You may also have mild cramping and bloating of your abdomen. If you were given a sedative during the procedure, it can affect you for several hours. Do not drive or operate machinery until your health care provider says that it is safe. Get help right away if you have a lot of blood in your stool, nausea or vomiting, a fever, or increased pain in your abdomen. This information is not intended to replace advice given to you by your health care provider. Make sure you discuss any questions you have with your health care provider. Document Revised: 07/29/2021 Document Reviewed: 07/29/2021 Elsevier Patient Education  [place] After The following information offers guidance on how to care for yourself after your procedure. Your health care provider may also give you more specific instructions. If you have problems or questions, contact your health care provider. What can I expect after the procedure? After the procedure, it is common to have: Tiredness. Little or no memory about what happened during or after the procedure. Impaired judgment when it comes to making decisions. Nausea or vomiting. Some trouble with balance. Follow these instructions at home: For the time period you were told by your health care provider:  Rest. Do not participate in activities where  you could fall or become injured. Do not drive or use machinery. Do not drink alcohol. Do not take sleeping pills or medicines that cause drowsiness. Do not make important decisions or sign legal documents. Do not take care of children on your own. Medicines Take over-the-counter and prescription medicines only as told by your health care provider. If you were prescribed antibiotics, take them as told by your health care provider. Do not stop using the antibiotic even if you start to feel better. Eating and drinking Follow instructions from your health care provider about what you may eat and drink. Drink enough fluid to keep your urine pale yellow. If you vomit: Drink clear fluids slowly and in small amounts as you are able. Clear fluids include water, ice chips, low-calorie sports drinks, and fruit juice that has water added to it (diluted fruit juice). Eat light and bland foods in small  amounts as you are able. These foods include bananas, applesauce, rice, lean meats, toast, and crackers. General instructions  Have a responsible adult stay with you for the time you are told. It is important to have someone help care for you until you are awake and alert. If you have sleep apnea, surgery and some medicines can increase your risk for breathing problems. Follow instructions from your health care provider about wearing your sleep device: When you are sleeping. This includes during daytime naps. While taking prescription pain medicines, sleeping medicines, or medicines that make you drowsy. Do not use any products that contain nicotine or tobacco. These products include cigarettes, chewing tobacco, and vaping devices, such as e-cigarettes. If you need help quitting, ask your health care provider. Contact a health care provider if: You feel nauseous or vomit every time you eat or drink. You feel light-headed. You are still sleepy or having trouble with balance after 24 hours. You get a  rash. You have a fever. You have redness or swelling around the IV site. Get help right away if: You have trouble breathing. You have new confusion after you get home. These symptoms may be an emergency. Get help right away. Call 911. Do not wait to see if the symptoms will go away. Do not drive yourself to the hospital. This information is not intended to replace advice given to you by your health care provider. Make sure you discuss any questions you have with your health care provider. Document Revised: 05/03/2022 Document Reviewed: 05/03/2022 Elsevier Patient Education  [place].

## 2023-01-13 ENCOUNTER — Telehealth (INDEPENDENT_AMBULATORY_CARE_PROVIDER_SITE_OTHER): Payer: Self-pay | Admitting: *Deleted

## 2023-01-13 ENCOUNTER — Encounter (HOSPITAL_COMMUNITY): Payer: Self-pay

## 2023-01-13 ENCOUNTER — Encounter (HOSPITAL_COMMUNITY)
Admission: RE | Admit: 2023-01-13 | Discharge: 2023-01-13 | Disposition: A | Discharge status: Home / Self Care | Payer: 59 | Source: Ambulatory Visit | Attending: Internal Medicine | Admitting: Internal Medicine

## 2023-01-13 DIAGNOSIS — N1832 Chronic kidney disease, stage 3b: Secondary | ICD-10-CM

## 2023-01-13 DIAGNOSIS — R7303 Prediabetes: Secondary | ICD-10-CM

## 2023-01-13 NOTE — Telephone Encounter (Signed)
-----  Message from Encarnacion Chu, RN sent at 01/13/2023  3:30 PM EST ----- Regarding: no show Good afternoon ladies! Scott Hubbard did not show for his preop this morning.

## 2023-01-13 NOTE — Pre-Procedure Instructions (Signed)
Office messaged because patient did not show for preop for Dr Abbey Chatters for his procedure on 01/17/2023.

## 2023-01-13 NOTE — Telephone Encounter (Signed)
See previous notes, pt was notified of his pre-op appt x 2.   Called pt, line rang numerous times, no answer and was not able to leave a VM

## 2023-01-14 NOTE — Telephone Encounter (Signed)
Still unable to get pt. Procedure cancelled for Monday. FYI to Dr. Abbey Chatters

## 2023-01-14 NOTE — Telephone Encounter (Signed)
Called pt, no answer and no VM. Line rang numerous times

## 2023-01-17 ENCOUNTER — Encounter: Payer: Self-pay | Admitting: *Deleted

## 2023-01-17 NOTE — Telephone Encounter (Signed)
Pt has been rescheduled until 02/04/23. New instructions mailed to pt.

## 2023-02-01 NOTE — Patient Instructions (Signed)
   Your procedure is scheduled on: 02/04/2023  Report to [facility] [place] Entrance at     9:00 AM.  Call this number if you have problems the morning of surgery: [phone]   Remember:              Follow Directions on the letter you received from Your Physician's office regarding the Bowel Prep              No Smoking the day of Procedure :   Take these medicines the morning of surgery with A SIP OF WATER: pepcid, pantoprazole, and flexeril  Use inhalers am of procedure if needed   Do not wear jewelry, make-up or nail polish.    Do not bring valuables to the hospital.  Contacts, dentures or bridgework may not be worn into surgery.  .   Patients discharged the day of surgery will not be allowed to drive home.     Colonoscopy, Adult, Care After This sheet gives you information about how to care for yourself after your procedure. Your health care provider may also give you more specific instructions. If you have problems or questions, contact your health care provider. What can I expect after the procedure? After the procedure, it is common to have: A small amount of blood in your stool for 24 hours after the procedure. Some gas. Mild abdominal cramping or bloating.  Follow these instructions at home: General instructions  For the first 24 hours after the procedure: Do not drive or use machinery. Do not sign important documents. Do not drink alcohol. Do your regular daily activities at a slower pace than normal. Eat soft, easy-to-digest foods. Rest often. Take over-the-counter or prescription medicines only as told by your health care provider. It is up to you to get the results of your procedure. Ask your health care provider, or the department performing the procedure, when your results will be ready. Relieving cramping and bloating Try walking around when you have cramps or feel bloated. Apply heat to your abdomen as told by your health care provider. Use a heat source  that your health care provider recommends, such as a moist heat pack or a heating pad. Place a towel between your skin and the heat source. Leave the heat on for 20-30 minutes. Remove the heat if your skin turns bright red. This is especially important if you are unable to feel pain, heat, or cold. You may have a greater risk of getting burned. Eating and drinking Drink enough fluid to keep your urine clear or pale yellow. Resume your normal diet as instructed by your health care provider. Avoid heavy or fried foods that are hard to digest. Avoid drinking alcohol for as long as instructed by your health care provider. Contact a health care provider if: You have blood in your stool 2-3 days after the procedure. Get help right away if: You have more than a small spotting of blood in your stool. You pass large blood clots in your stool. Your abdomen is swollen. You have nausea or vomiting. You have a fever. You have increasing abdominal pain that is not relieved with medicine. This information is not intended to replace advice given to you by your health care provider. Make sure you discuss any questions you have with your health care provider. Document Released: 07/20/2004 Document Revised: 08/30/2016 Document Reviewed: 02/17/2016 Elsevier Interactive Patient Education  [name].

## 2023-02-02 ENCOUNTER — Encounter (HOSPITAL_COMMUNITY): Payer: Self-pay

## 2023-02-02 ENCOUNTER — Encounter (HOSPITAL_COMMUNITY)
Admission: RE | Admit: 2023-02-02 | Discharge: 2023-02-02 | Disposition: A | Discharge status: Home / Self Care | Payer: 59 | Source: Ambulatory Visit | Attending: Internal Medicine | Admitting: Internal Medicine

## 2023-02-02 DIAGNOSIS — N1832 Chronic kidney disease, stage 3b: Secondary | ICD-10-CM | POA: Diagnosis not present

## 2023-02-02 DIAGNOSIS — Z01812 Encounter for preprocedural laboratory examination: Secondary | ICD-10-CM | POA: Diagnosis not present

## 2023-02-02 DIAGNOSIS — R7303 Prediabetes: Secondary | ICD-10-CM | POA: Diagnosis not present

## 2023-02-02 LAB — BASIC METABOLIC PANEL
Anion gap: 8 (ref 5–15)
BUN: 25 mg/dL — ABNORMAL HIGH (ref 8–23)
CO2: 27 mmol/L (ref 22–32)
Calcium: 8.8 mg/dL — ABNORMAL LOW (ref 8.9–10.3)
Chloride: 102 mmol/L (ref 98–111)
Creatinine, Ser: 1.88 mg/dL — ABNORMAL HIGH (ref 0.61–1.24)
GFR, Estimated: 38 mL/min — ABNORMAL LOW (ref >60)
Glucose, Bld: 95 mg/dL (ref 70–99)
Potassium: 4.4 mmol/L (ref 3.5–5.1)
Sodium: 137 mmol/L (ref 135–145)

## 2023-02-04 ENCOUNTER — Ambulatory Visit (HOSPITAL_BASED_OUTPATIENT_CLINIC_OR_DEPARTMENT_OTHER): Payer: 59 | Admitting: Anesthesiology

## 2023-02-04 ENCOUNTER — Ambulatory Visit (HOSPITAL_COMMUNITY): Payer: 59 | Admitting: Anesthesiology

## 2023-02-04 ENCOUNTER — Ambulatory Visit (HOSPITAL_COMMUNITY)
Admission: RE | Admit: 2023-02-04 | Discharge: 2023-02-04 | Disposition: A | Discharge status: Home / Self Care | Payer: 59 | Source: Ambulatory Visit | Attending: Internal Medicine | Admitting: Internal Medicine

## 2023-02-04 ENCOUNTER — Telehealth: Payer: Self-pay | Admitting: Internal Medicine

## 2023-02-04 ENCOUNTER — Encounter (HOSPITAL_COMMUNITY): Payer: Self-pay

## 2023-02-04 ENCOUNTER — Encounter (HOSPITAL_COMMUNITY)
Admission: RE | Disposition: A | Discharge status: Home / Self Care | Payer: Self-pay | Source: Ambulatory Visit | Attending: Internal Medicine

## 2023-02-04 DIAGNOSIS — R195 Other fecal abnormalities: Secondary | ICD-10-CM | POA: Diagnosis not present

## 2023-02-04 DIAGNOSIS — Z87891 Personal history of nicotine dependence: Secondary | ICD-10-CM | POA: Diagnosis not present

## 2023-02-04 DIAGNOSIS — J449 Chronic obstructive pulmonary disease, unspecified: Secondary | ICD-10-CM | POA: Diagnosis not present

## 2023-02-04 DIAGNOSIS — Z1211 Encounter for screening for malignant neoplasm of colon: Secondary | ICD-10-CM | POA: Diagnosis not present

## 2023-02-04 DIAGNOSIS — I10 Essential (primary) hypertension: Secondary | ICD-10-CM | POA: Insufficient documentation

## 2023-02-04 DIAGNOSIS — K219 Gastro-esophageal reflux disease without esophagitis: Secondary | ICD-10-CM | POA: Insufficient documentation

## 2023-02-04 DIAGNOSIS — Q438 Other specified congenital malformations of intestine: Secondary | ICD-10-CM | POA: Diagnosis not present

## 2023-02-04 HISTORY — PX: COLONOSCOPY WITH PROPOFOL: SHX5780

## 2023-02-04 SURGERY — COLONOSCOPY WITH PROPOFOL
Anesthesia: General

## 2023-02-04 MED ORDER — PROPOFOL 500 MG/50ML IV EMUL
INTRAVENOUS | Status: DC | PRN
  Administered 2023-02-04: 200 ug/kg/min via INTRAVENOUS

## 2023-02-04 MED ORDER — PROPOFOL 10 MG/ML IV BOLUS
INTRAVENOUS | Status: DC | PRN
  Administered 2023-02-04: 100 mg via INTRAVENOUS

## 2023-02-04 MED ORDER — LACTATED RINGERS IV SOLN: INTRAVENOUS | Status: DC | PRN

## 2023-02-04 MED ORDER — LIDOCAINE HCL (CARDIAC) PF 100 MG/5ML IV SOSY
PREFILLED_SYRINGE | INTRAVENOUS | Status: DC | PRN
  Administered 2023-02-04: 50 mg via INTRATRACHEAL

## 2023-02-04 NOTE — Transfer of Care (Signed)
Immediate Anesthesia Transfer of Care Note  Patient: Terald A Kastelic  Procedure(s) Performed: COLONOSCOPY WITH PROPOFOL  Patient Location: Short Stay  Anesthesia Type:General  Level of Consciousness: awake, alert , oriented, and patient cooperative  Airway & Oxygen Therapy: Patient Spontanous Breathing  Post-op Assessment: Report given to RN, Post -op Vital signs reviewed and stable, and Patient moving all extremities  Post vital signs: Reviewed and stable  Last Vitals:  Vitals Value Taken Time  BP    Temp    Pulse    Resp    SpO2      Last Pain:  Vitals:   02/04/23 1100  PainSc: 0-No pain         Complications: No notable events documented.

## 2023-02-04 NOTE — Telephone Encounter (Signed)
SUSAN, please schedule for OV to get rescheduled. Thanks!

## 2023-02-04 NOTE — Discharge Instructions (Signed)
  Colonoscopy Discharge Instructions  Read the instructions outlined below and refer to this sheet in the next few weeks. These discharge instructions provide you with general information on caring for yourself after you leave the hospital. Your doctor may also give you specific instructions. While your treatment has been planned according to the most current medical practices available, unavoidable complications occasionally occur.   ACTIVITY You may resume your regular activity, but move at a slower pace for the next 24 hours.  Take frequent rest periods for the next 24 hours.  Walking will help get rid of the air and reduce the bloated feeling in your belly (abdomen).  No driving for 24 hours (because of the medicine (anesthesia) used during the test).   Do not sign any important legal documents or operate any machinery for 24 hours (because of the anesthesia used during the test).  NUTRITION Drink plenty of fluids.  You may resume your normal diet as instructed by your doctor.  Begin with a light meal and progress to your normal diet. Heavy or fried foods are harder to digest and may make you feel sick to your stomach (nauseated).  Avoid alcoholic beverages for 24 hours or as instructed.  MEDICATIONS You may resume your normal medications unless your doctor tells you otherwise.  WHAT YOU CAN EXPECT TODAY Some feelings of bloating in the abdomen.  Passage of more gas than usual.  Spotting of blood in your stool or on the toilet paper.  IF YOU HAD POLYPS REMOVED DURING THE COLONOSCOPY: No aspirin products for 7 days or as instructed.  No alcohol for 7 days or as instructed.  Eat a soft diet for the next 24 hours.  FINDING OUT THE RESULTS OF YOUR TEST Not all test results are available during your visit. If your test results are not back during the visit, make an appointment with your caregiver to find out the results. Do not assume everything is normal if you have not heard from your  caregiver or the medical facility. It is important for you to follow up on all of your test results.  SEEK IMMEDIATE MEDICAL ATTENTION IF: You have more than a spotting of blood in your stool.  Your belly is swollen (abdominal distention).  You are nauseated or vomiting.  You have a temperature over 101.  You have abdominal pain or discomfort that is severe or gets worse throughout the day.   Unfortunately, your colon was not adequately prepped today for colonoscopy.   Would recommend repeat colonoscopy in 3-6 months with a different colon prep.   I hope you have a great rest of your week!  Elon Alas. Abbey Chatters, D.O. Gastroenterology and Hepatology [facility]

## 2023-02-04 NOTE — Op Note (Signed)
[facility] Patient Name: Scott Hubbard Procedure Date: 02/04/2023 10:50 AM MRN: [id number] Date of Birth: 1956-11-03 Attending MD: Elon Alas. Abbey Chatters , [place], [id number] CSN: [id number] Age: 67 Admit Type: Outpatient Procedure:                Colonoscopy Indications:              Positive Cologuard test Providers:                Elon Alas. Abbey Chatters, DO, Crystal Page, Randa Spike, Technician Referring MD:              Medicines:                See the Anesthesia note for documentation of the                            administered medications Complications:            No immediate complications. Estimated Blood Loss:     Estimated blood loss: none. Procedure:                Pre-Anesthesia Assessment:                           - The anesthesia plan was to use monitored                            anesthesia care (MAC).                           After obtaining informed consent, the colonoscope                            was passed under direct vision. Throughout the                            procedure, the patient's blood pressure, pulse, and                            oxygen saturations were monitored continuously. The                            [id number]-[id number] scope was introduced through                            the anus with the intention of advancing to the                            cecum. The scope was advanced to the ascending                            colon before the procedure was aborted. Medications                            were given. The colonoscopy was technically  difficult and complex due to poor bowel prep, a                            redundant colon and significant looping. Successful                            completion of the procedure was aided by applying                            abdominal pressure. The patient tolerated the                            procedure well. The quality of the bowel                             preparation was evaluated using the BBPS [facility]                            Bowel Preparation Scale) with scores of: Right                            Colon = 0 (unprepared, mucosa not seen due to solid                            stool that cannot be cleared or unseen proximal                            colon segment in a colonoscopy aborted due to                            inadequate bowel prep), Transverse Colon = 0                            (unprepared, mucosa not seen due to solid stool                            that cannot be cleared or unseen proximal colon                            segment in a colonoscopy aborted due to inadequate                            bowel prep) and Left Colon = 0 (unprepared, mucosa                            not seen due to solid stool that cannot be cleared                            or unseen proximal colon segment in a colonoscopy                            aborted due to inadequate bowel prep).  The total                            BBPS score equals 0. The quality of the bowel                            preparation was inadequate. Scope In: 11:03:52 AM Scope Out: 11:15:58 AM Total Procedure Duration: 0 hours 12 minutes 6 seconds  Findings:      The perianal and digital rectal examinations were normal.      Extensive amounts of liquid solid stool was found in the entire colon,       precluding visualization. Impression:               - Preparation of the colon was inadequate.                           - Stool in the entire examined colon.                           - No specimens collected. Moderate Sedation:      Per Anesthesia Care Recommendation:           - Patient has a contact number available for                            emergencies. The signs and symptoms of potential                            delayed complications were discussed with the                            patient. Return to normal activities tomorrow.                             Written discharge instructions were provided to the                            patient.                           - Resume previous diet.                           - Continue present medications.                           - Repeat colonoscopy in 3 months because the bowel                            preparation was poor. Procedure Code(s):        --- Professional ---                           [phone], 85, Colonoscopy, flexible; diagnostic,                            including collection of  specimen(s) by brushing or                            washing, when performed (separate procedure) Diagnosis Code(s):        --- Professional ---                           R19.5, Other fecal abnormalities CPT copyright 2022 American Medical Association. All rights reserved. The codes documented in this report are preliminary and upon coder review may  be revised to meet current compliance requirements. Elon Alas. Abbey Chatters, DO [place] Abbey Chatters, DO 02/04/2023 11:19:57 AM This report has been signed electronically. Number of Addenda: 0

## 2023-02-04 NOTE — H&P (Signed)
Primary Care Physician:  Lindell Spar, MD Primary Gastroenterologist:  Dr. Abbey Chatters  Pre-Procedure History & Physical: HPI:  Scott Hubbard is a 67 y.o. male is here for a colonoscopy to be performed for positive Cologuard testing.  Past Medical History:  Diagnosis Date   Chest discomfort    negative stress echocardiogram-01/2011   Chronic bronchitis    COPD (chronic obstructive pulmonary disease) (HCC)    DJD (degenerative joint disease)    Gastro-esophageal reflux disease with esophagitis    GERD (gastroesophageal reflux disease)    Hyperlipidemia    Hypertension    Migraine    PVC (premature ventricular contraction)    Frequent   Tobacco abuse    40 pack years    Past Surgical History:  Procedure Laterality Date   None      Prior to Admission medications   Medication Sig Start Date End Date Taking? Authorizing Provider  acetaminophen (TYLENOL) 500 MG tablet Take 1,000 mg by mouth every 6 (six) hours as needed.   Yes [provider]  albuterol (PROAIR HFA) 108 (90 Base) MCG/ACT inhaler 2 puffs every 4 hours as needed only  if your can't catch your breath 09/01/22  Yes Tanda Rockers, MD  cyclobenzaprine (FLEXERIL) 10 MG tablet TAKE 1 TABLET BY MOUTH TWICE A DAY AS NEEDED FOR MUSCLE SPASMS 10/25/22  Yes Lindell Spar, MD  famotidine (PEPCID) 20 MG tablet Take 1 tablet (20 mg total) by mouth daily after supper. 11/10/22  Yes Tanda Rockers, MD  FLOVENT HFA 220 MCG/ACT inhaler TAKE 2 PUFFS BY MOUTH TWICE A DAY 06/23/22  Yes Tanda Rockers, MD  pantoprazole (PROTONIX) 40 MG tablet Take 30-60 min before first meal of the day 09/01/22  Yes Tanda Rockers, MD  predniSONE (DELTASONE) 10 MG tablet Take  4 each am x 2 days,   2 each am x 2 days,  1 each am x 2 days and stop 09/29/22  Yes Tanda Rockers, MD  rOPINIRole (REQUIP) 1 MG tablet TAKE 1 TABLET BY MOUTH AT BEDTIME. 09/29/22  Yes Lindell Spar, MD  Tiotropium Bromide-Olodaterol (STIOLTO RESPIMAT) 2.5-2.5 MCG/ACT  AERS INHALE 2 PUFFS BY MOUTH INTO THE LUNGS DAILY 01/11/22  Yes Tanda Rockers, MD  Tiotropium Bromide-Olodaterol (STIOLTO RESPIMAT) 2.5-2.5 MCG/ACT AERS Inhale 2 puffs into the lungs daily. 12/30/22  Yes Tanda Rockers, MD  valsartan-hydrochlorothiazide (DIOVAN-HCT) 80-12.5 MG tablet TAKE 1 TABLET BY MOUTH EVERY DAY 09/28/22  Yes Lindell Spar, MD    Allergies as of 12/22/2022   (No Known Allergies)    History reviewed. No pertinent family history.  Social History   Socioeconomic History   Marital status: Legally Separated    Spouse name: Not on file   Number of children: 2   Years of education: Not on file   Highest education level: Not on file  Occupational History   Occupation: unemployed    Employer: UNEMPLOYED  Tobacco Use   Smoking status: Former    Packs/day: 1.50    Years: 40.00    Total pack years: 60.00    Types: Cigarettes    Quit date: 04/27/2016    Years since quitting: 6.7   Smokeless tobacco: Never  Vaping Use   Vaping Use: Never used  Substance and Sexual Activity   Alcohol use: No   Drug use: No   Sexual activity: Not on file  Other Topics Concern   Not on file  Social History Narrative  Lives with girlfriend.  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     Social Determinants of Health   Financial Resource Strain: Low Risk  (09/06/2022)   Overall Financial Resource Strain (CARDIA)    Difficulty of Paying Living Expenses: Not hard at all  Food Insecurity: No Food Insecurity (09/06/2022)   Hunger Vital Sign    Worried About Running Out of Food in the Last Year: Never true    Ran Out of Food in the Last Year: Never true  Transportation Needs: No Transportation Needs (09/06/2022)   PRAPARE - Hydrologist (Medical): No    Lack of Transportation (Non-Medical): No  Physical Activity: Sufficiently Active (09/06/2022)   Exercise Vital Sign    Days of Exercise per Week: 5 days    Minutes of Exercise per Session: 30 min  Stress: No  Stress Concern Present (09/06/2022)   [place]    Feeling of Stress : Not at all  Social Connections: Moderately Integrated (09/06/2022)   Social Connection and Isolation Panel [NHANES]    Frequency of Communication with Friends and Family: More than three times a week    Frequency of Social Gatherings with Friends and Family: Twice a week    Attends Religious Services: 1 to 4 times per year    Active Member of [employer] or Organizations: No    Attends Archivist Meetings: Never    Marital Status: Living with partner  Intimate Partner Violence: Not At Risk (09/06/2022)   Humiliation, Afraid, Rape, and Kick questionnaire    Fear of Current or Ex-Partner: No    Emotionally Abused: No    Physically Abused: No    Sexually Abused: No    Review of Systems: See HPI, otherwise negative ROS  Physical Exam: Vital signs in last 24 hours: Temp:  [97.9 F (36.6 C)] 97.9 F (36.6 C) (02/16 1016) Pulse Rate:  [84] 84 (02/16 1016) Resp:  [20] 20 (02/16 1016) BP: (143)/(75) 143/75 (02/16 1016) SpO2:  [97 %] 97 % (02/16 1016)   General:   Alert,  Well-developed, well-nourished, pleasant and cooperative in NAD Head:  Normocephalic and atraumatic. Eyes:  Sclera clear, no icterus.   Conjunctiva pink. Ears:  Normal auditory acuity. Nose:  No deformity, discharge,  or lesions. Msk:  Symmetrical without gross deformities. Normal posture. Extremities:  Without clubbing or edema. Neurologic:  Alert and  oriented x4;  grossly normal neurologically. Skin:  Intact without significant lesions or rashes. Psych:  Alert and cooperative. Normal mood and affect.  Impression/Plan: Scott Hubbard is here for a colonoscopy to be performed for positive Cologuard testing.  The risks of the procedure including infection, bleed, or perforation as well as benefits, limitations, alternatives and imponderables have been reviewed with the  patient. Questions have been answered. All parties agreeable.

## 2023-02-04 NOTE — Anesthesia Preprocedure Evaluation (Signed)
Anesthesia Evaluation  Patient identified by MRN, date of birth, ID band Patient awake    Reviewed: Allergy & Precautions, H&P , NPO status , Patient's Chart, lab work & pertinent test results, reviewed documented beta blocker date and time   Airway Mallampati: II  TM Distance: >3 FB Neck ROM: full    Dental no notable dental hx.    Pulmonary neg pulmonary ROS, COPD, former smoker   Pulmonary exam normal breath sounds clear to auscultation       Cardiovascular Exercise Tolerance: Good hypertension, + angina  + DOE  negative cardio ROS  Rhythm:regular Rate:Normal     Neuro/Psych  Headaches negative neurological ROS  negative psych ROS   GI/Hepatic negative GI ROS, Neg liver ROS,GERD  ,,  Endo/Other  negative endocrine ROS    Renal/GU Renal diseasenegative Renal ROS  negative genitourinary   Musculoskeletal   Abdominal   Peds  Hematology negative hematology ROS (+)   Anesthesia Other Findings   Reproductive/Obstetrics negative OB ROS                             Anesthesia Physical Anesthesia Plan  ASA: 3  Anesthesia Plan: General   Post-op Pain Management:    Induction:   PONV Risk Score and Plan: Propofol infusion  Airway Management Planned:   Additional Equipment:   Intra-op Plan:   Post-operative Plan:   Informed Consent: I have reviewed the patients History and Physical, chart, labs and discussed the procedure including the risks, benefits and alternatives for the proposed anesthesia with the patient or authorized representative who has indicated his/her understanding and acceptance.     Dental Advisory Given  Plan Discussed with: CRNA  Anesthesia Plan Comments:        Anesthesia Quick Evaluation

## 2023-02-04 NOTE — Telephone Encounter (Signed)
Patient presented for colonoscopy today.  Unfortunately he was not prepped adequately.  States he threw up the first half of his prep.  Can we reschedule him for colonoscopy in 2 to 3 months with a different colon prep?  Can send in Zofran as well to take before.  Thank you

## 2023-02-05 NOTE — Anesthesia Postprocedure Evaluation (Signed)
Anesthesia Post Note  Patient: Scott Hubbard  Procedure(s) Performed: COLONOSCOPY WITH PROPOFOL  Patient location during evaluation: Phase II Anesthesia Type: General Level of consciousness: awake Pain management: pain level controlled Vital Signs Assessment: post-procedure vital signs reviewed and stable Respiratory status: spontaneous breathing and respiratory function stable Cardiovascular status: blood pressure returned to baseline and stable Postop Assessment: no headache and no apparent nausea or vomiting Anesthetic complications: no Comments: Late entry   No notable events documented.   Last Vitals:  Vitals:   02/04/23 1120 02/04/23 1125  BP: (!) 99/59 115/75  Pulse: 79   Resp: 19   Temp: 36.7 C   SpO2: 96%     Last Pain:  Vitals:   02/04/23 1120  TempSrc: Oral  PainSc: 0-No pain                 Louann Sjogren

## 2023-02-08 ENCOUNTER — Encounter: Payer: Self-pay | Admitting: Internal Medicine

## 2023-02-10 ENCOUNTER — Encounter (HOSPITAL_COMMUNITY): Payer: Self-pay | Admitting: Internal Medicine

## 2023-02-12 ENCOUNTER — Other Ambulatory Visit: Payer: Self-pay | Admitting: Internal Medicine

## 2023-02-12 DIAGNOSIS — M62838 Other muscle spasm: Secondary | ICD-10-CM

## 2023-02-23 ENCOUNTER — Ambulatory Visit: Payer: Medicare Other | Admitting: Internal Medicine

## 2023-03-01 ENCOUNTER — Other Ambulatory Visit: Payer: Self-pay

## 2023-03-01 MED ORDER — STIOLTO RESPIMAT 2.5-2.5 MCG/ACT IN AERS: 2.0000 | INHALATION_SPRAY | Freq: Every day | RESPIRATORY_TRACT | 11 refills | Status: DC

## 2023-03-01 NOTE — Telephone Encounter (Signed)
Pt called and requested refill for stiolto to go to [facility] [place]

## 2023-03-05 NOTE — Progress Notes (Unsigned)
Referring Provider: Lindell Spar, MD Primary Care Physician:  Lindell Spar, MD Primary GI Physician: Dr. Abbey Chatters  Chief Complaint  Patient presents with   Colonoscopy    Colonoscopy screening    HPI:   Scott Hubbard is a 67 y.o. male presenting today to discuss rescheduling colonoscopy due to poor prep.   Colonoscopy 02/04/23: Extensive amounts of liquid solid stool in the entire colon precluding visualization. Recommended repeat in 3 months.   Patient reported he threw up the first half of his prep.   Today:  Drank about 1/2 of the prep, but ended up vomiting.  States he drank it too fast.  After he vomited the first half, he did end up finishing the rest, but slowed down.  He was drinking about 16 ounces every 15 minutes initially.  Prefers to have the same prep.  States the taste did not bother him at all.  Denies any GI concerns.  States bowels move fairly well.  He will have bowel movements daily for 2 or 3 days, but may skip a couple of days between bowel movements.  Does not feel constipated.  No BRBPR, melena, nausea, vomiting.  Chronic heartburn well-controlled on pantoprazole and Pepcid.  No dysphagia.  Bowel move daily for 2-3 days then can skip 2-3 days. Stools are soft. No brbpr or melena. No nausea, vomiting. Heartburn well controlled. No dysphagia.    Past Medical History:  Diagnosis Date   Chest discomfort    negative stress echocardiogram-01/2011   Chronic bronchitis    CKD (chronic kidney disease)    COPD (chronic obstructive pulmonary disease) (HCC)    DJD (degenerative joint disease)    Gastro-esophageal reflux disease with esophagitis    GERD (gastroesophageal reflux disease)    Hyperlipidemia    Hypertension    Migraine    PVC (premature ventricular contraction)    Frequent   Tobacco abuse    40 pack years    Past Surgical History:  Procedure Laterality Date   COLONOSCOPY WITH PROPOFOL N/A 02/04/2023   Eloise Harman, DO; inadequate  prep    Current Outpatient Medications  Medication Sig Dispense Refill   acetaminophen (TYLENOL) 500 MG tablet Take 1,000 mg by mouth every 6 (six) hours as needed.     albuterol (PROAIR HFA) 108 (90 Base) MCG/ACT inhaler 2 puffs every 4 hours as needed only  if your can't catch your breath 18 g 11   cyclobenzaprine (FLEXERIL) 10 MG tablet TAKE 1 TABLET BY MOUTH TWICE A DAY AS NEEDED FOR MUSCLE SPASM 60 tablet 2   famotidine (PEPCID) 20 MG tablet Take 1 tablet (20 mg total) by mouth daily after supper. 90 tablet 3   ondansetron (ZOFRAN) 4 MG tablet On the day of your colon prep, take 1 tablet (4 mg total) by mouth every 8 (eight) hours as needed for nausea or vomiting. 5 tablet 0   pantoprazole (PROTONIX) 40 MG tablet Take 30-60 min before first meal of the day 30 tablet 2   Tiotropium Bromide-Olodaterol (STIOLTO RESPIMAT) 2.5-2.5 MCG/ACT AERS INHALE 2 PUFFS BY MOUTH INTO THE LUNGS DAILY 4 g 11   Tiotropium Bromide-Olodaterol (STIOLTO RESPIMAT) 2.5-2.5 MCG/ACT AERS Inhale 2 puffs into the lungs daily. 4 g 11   valsartan-hydrochlorothiazide (DIOVAN-HCT) 80-12.5 MG tablet TAKE 1 TABLET BY MOUTH EVERY DAY 90 tablet 3   FLOVENT HFA 220 MCG/ACT inhaler TAKE 2 PUFFS BY MOUTH TWICE A DAY 36 each 3   No current facility-administered medications  for this visit.    Allergies as of 03/09/2023   (No Known Allergies)    Family History  Problem Relation Age of Onset   Colon cancer Neg Hx     Social History   Socioeconomic History   Marital status: Legally Separated    Spouse name: Not on file   Number of children: 2   Years of education: Not on file   Highest education level: Not on file  Occupational History   Occupation: unemployed    Employer: UNEMPLOYED  Tobacco Use   Smoking status: Former    Packs/day: 1.50    Years: 40.00    Additional pack years: 0.00    Total pack years: 60.00    Types: Cigarettes    Quit date: 04/27/2016    Years since quitting: 6.8   Smokeless tobacco:  Never  Vaping Use   Vaping Use: Never used  Substance and Sexual Activity   Alcohol use: No   Drug use: No   Sexual activity: Not on file  Other Topics Concern   Not on file  Social History Narrative   Lives with girlfriend.  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     Social Determinants of Health   Financial Resource Strain: Low Risk  (09/06/2022)   Overall Financial Resource Strain (CARDIA)    Difficulty of Paying Living Expenses: Not hard at all  Food Insecurity: No Food Insecurity (09/06/2022)   Hunger Vital Sign    Worried About Running Out of Food in the Last Year: Never true    Ran Out of Food in the Last Year: Never true  Transportation Needs: No Transportation Needs (09/06/2022)   PRAPARE - Hydrologist (Medical): No    Lack of Transportation (Non-Medical): No  Physical Activity: Sufficiently Active (09/06/2022)   Exercise Vital Sign    Days of Exercise per Week: 5 days    Minutes of Exercise per Session: 30 min  Stress: No Stress Concern Present (09/06/2022)   [place]    Feeling of Stress : Not at all  Social Connections: Moderately Integrated (09/06/2022)   Social Connection and Isolation Panel [NHANES]    Frequency of Communication with Friends and Family: More than three times a week    Frequency of Social Gatherings with Friends and Family: Twice a week    Attends Religious Services: 1 to 4 times per year    Active Member of [employer] or Organizations: No    Attends Archivist Meetings: Never    Marital Status: Living with partner    Review of Systems: Gen: Denies fever, chills, cold or flulike symptoms, presyncope, syncope. CV: Denies chest pain, palpitations. Resp: Admits to shortness of breath with minimal exertion.  No shortness of breath at rest.  No cough. GI: See HPI Heme: See HPI  Physical Exam: BP 136/74 (BP Location: Right Arm, Patient Position: Sitting,  Cuff Size: Normal)   Pulse 71   Temp 97.6 F (36.4 C) (Temporal)   Ht 6\' 5"  (1.956 m)   Wt 173 lb 12.8 oz (78.8 kg)   BMI 20.61 kg/m  General:   Alert and oriented. No distress noted. Pleasant and cooperative.  Head:  Normocephalic and atraumatic. Eyes:  Conjuctiva clear without scleral icterus. Heart:  S1, S2 present without murmurs appreciated. Lungs:  Clear to auscultation bilaterally. No wheezes, rales, or rhonchi. No distress.  Abdomen:  +BS, soft, non-tender and non-distended. No rebound or  guarding. No HSM or masses noted. Soft umbilical hernia.  Msk:  Symmetrical without gross deformities. Normal posture. Extremities:  Without edema. Neurologic:  Alert and  oriented x4 Psych:  Normal mood and affect.    Assessment:  67 year old male with history of COPD, HTN, HLD, PVCs, CKD, GERD, presenting today to discuss rescheduling colonoscopy for screening purposes in the setting of positive Cologuard.  Recently attempted colonoscopy 02/04/2023, but he had an extensive amount of stool throughout the colon.  Patient reports he ended up vomiting after drinking the first half of his prep due to drinking the prep too quickly.  He did finish his prep without any trouble and prefers to have the same prep again.  States he would just slow down.  Denies any significant GI symptoms or alarm symptoms.  Had previously noted about 5 pound weight loss between November 2023 in January 2024, but weight has been stable since then.   Plan:  Proceed with colonoscopy with propofol by Dr. Abbey Chatters in near future. The risks, benefits, and alternatives have been discussed with the patient in detail. The patient states understanding and desires to proceed.  ASA 3 Advised to drink 8 oz every 15 min or so.  Extra 1/2-day of clear liquids Linzess 145 mcg daily x 4 days prior to colon prep. Zofran day of colon prep to prevent nausea/vomiting Follow-up after colonoscopy PRN.    Aliene Altes, PA-C [facility]  Gastroenterology 03/09/2023

## 2023-03-07 DIAGNOSIS — S20361A Insect bite (nonvenomous) of right front wall of thorax, initial encounter: Secondary | ICD-10-CM | POA: Diagnosis not present

## 2023-03-07 DIAGNOSIS — W57XXXA Bitten or stung by nonvenomous insect and other nonvenomous arthropods, initial encounter: Secondary | ICD-10-CM | POA: Diagnosis not present

## 2023-03-09 ENCOUNTER — Encounter: Payer: Self-pay | Admitting: Gastroenterology

## 2023-03-09 ENCOUNTER — Ambulatory Visit (INDEPENDENT_AMBULATORY_CARE_PROVIDER_SITE_OTHER): Payer: 59 | Admitting: Gastroenterology

## 2023-03-09 VITALS — BP 136/74 | HR 71 | Temp 97.6°F | Ht 77.0 in | Wt 173.8 lb

## 2023-03-09 DIAGNOSIS — R195 Other fecal abnormalities: Secondary | ICD-10-CM | POA: Diagnosis not present

## 2023-03-09 MED ORDER — ONDANSETRON HCL 4 MG PO TABS: ORAL_TABLET | ORAL | 0 refills | Status: DC

## 2023-03-09 NOTE — Patient Instructions (Addendum)
We will arrange you to have a colon anoscopy in the near future with Dr. Abbey Chatters. As requested, we will provide you with the same prep that you had last time.  Be sure to slow down and drink about one 8 ounce glass every 15 minutes. I am also providing you with a prescription of Zofran.  This is a nausea medication that you can take in the morning and every 8 hours thereafter the morning that you start your colon prep. We will also give you samples of Linzess 145 mcg to take daily for 4 days before you start your colon prep.  Will follow-up with you in the office after your procedures as Dr. Abbey Chatters recommends.  It was good to meet you today!  Scott Altes, PA-C [facility] Gastroenterology

## 2023-03-10 ENCOUNTER — Telehealth: Payer: Self-pay | Admitting: *Deleted

## 2023-03-10 NOTE — Telephone Encounter (Signed)
Attempted to call pt, > 10 rings, no answer. Will call back

## 2023-03-16 ENCOUNTER — Encounter: Payer: Self-pay | Admitting: *Deleted

## 2023-03-16 NOTE — Telephone Encounter (Signed)
Attempted to call pt, > 10 rings and cell number not in service. Will mail letter.

## 2023-03-16 NOTE — Progress Notes (Unsigned)
Scott Hubbard, male    DOB: 1956-09-21    MRN: [id number]   Brief patient profile:  26 yowm MM/quit smoking 04/2016 @ onset of severe spell of sob and since then doe gradually worse despite rx with flovent/ stiolto so referred to pulmonary clinic 04/09/2020 by Dr  [name]     History of Present Illness  04/09/2020  Pulmonary/ 1st office eval/[name]  Chief Complaint  Patient presents with   Pulmonary Consult    Referred by Dr. [name] for eval of COPD. Former pt of Dr [name]. He states he gets SOB "when I do anything".    Dyspnea:  [employer] x 2 aisles but walks "faster than other people" (walked very slowly in office) so MMRC3 = can't walk 100 yards even at a slow pace at a flat grade s stopping due to sob    Last time mb and back was prior to prior to when he quit smoking as has long driveway with incline on way back  Cough: none Sleep: flat bed / one pillow L side s respiratory symptoms  SABA use: 2 pffs each am / does not know how when to use hfa vs neb     rec Plan A = Automatic = Always=    Stiolto x 2 pffs/ flovent x 2puffs  first thing in am and repeat flovent in pm  Prednisone 10 mg x 2 daily until breathing better then 1 daily x a week and stop (#60)     Take valsartan 40 mg x 2 pills daily until you see your cardiologist            Plan B = Backup (to supplement plan A, not to replace it) Only use your albuterol inhaler as a rescue medication      06/09/2022  f/u ov/[place] office/[name] re: copd gold 4 maint on stiolto worse x several months  Chief Complaint  Patient presents with   Follow-up    Feels breathing has ben worse since last visit. No longer wheezing and not coughing as much   Dyspnea:  now rides scooter at [employer] Cough: none  Sleeping: flat bed/ one pillow  SABA use: hfa last used 3.5 h pta  5-6 x per day  02: none  Lung cancer screening: declined  Rec Plan A = Automatic = Always=    Stiolto 2 pffs 1st thing AM  and chase with flovent 2 puffs/ repeat  the flovent 12 hours later  Work on inhaler technique:   Plan B = Backup (to supplement plan A, not to replace it) Only use your albuterol inhaler as a rescue medication  Plan C = Crisis (instead of Plan B but only if Plan B stops working) - only use your albuterol nebulizer if you first try Plan B  Be sure you are taking pepcid (famotidine) 20 mg after bfast and after supper  Prednisone 10 mg take  4 each am x 2 days,   2 each am x 2 days,  1 each am x 2 days and stop    Please schedule a follow up office visit in 6 weeks, call sooner if needed with all medications /inhalers/ solutions in hand Add refer to rehab if willing    09/01/2022  f/u ov/[place] office/[name] re: GOLD 4 COPD  maint on Stiolto 2 each am  / didn't bring all meds Chief Complaint  Patient presents with   Follow-up    He is not doing well and breathing is worse.  Dyspnea:  can still push buggy but prefers to ride scooter Cough: none  Sleeping: flat bed / one pillow SABA use: confusing with flovent / never prechallenges with saba  02: none  Covid status: xax x 4  Rec We can refer you to pulmonary rehab at [facility] if you wish   Continue Pantoprazole (protonix) 40 mg   Take  30-60 min before first meal of the day and Pepcid (famotidine)  20 mg after supper until return to office - this is the best way to tell whether stomach acid is contributing to your problem.    Plan A = Automatic = Always=  Stiolto 2 puffs first thing in am followed by Flovent 220 x 2 puffs repeat in 12hours Work on inhaler technique:  Plan B = Backup (to supplement plan A, not to replace it) Only use your albuterol inhaler as a rescue medication to be used if you can't catch your breath by resting or doing a relaxed purse lip breathing pattern.  - The less you use it, the better it will work when you need it. - Ok to use the inhaler up to 2 puffs  every 4 hours if you must but call for appointment if use goes up over your usual need - Don't  leave home without it !!  (think of it like the spare tire for your car)   Plan C = Crisis (instead of Plan B but only if Plan B stops working) - only use your albuterol nebulizer if you first try Plan B and it fails to help > ok to use the nebulizer up to every 4 hours but if start needing it regularly call for immediate appointment   Plan D = Doctor -if ABC not working > Prednisone 10 mg take  4 each am x 2 days,   2 each am x 2 days,  1 each am x 2 days and stop    Please schedule a follow up office visit in 4 weeks, sooner if needed  with all medications /inhalers/ solutions in hand so we can verify exactly what you are taking. This includes all medications from all doctors and over the counters    09/29/2022  f/u ov/[place] office/Paitlyn Mcclatchey re: GOLD 4 maint on stiolto/flovent  No chief complaint on file. 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 Cough: occ white minimal vol  Sleeping: bed is flat/ one pillow  SABA use: maybe once a month  02: none  Rec Continue pantoprazole 40 mg Take 30-60 min before first meal of the day  My office will be contacting you by phone for referral to lung cancer screening program  Ok to try albuterol 15 min before an activity (on alternating days between inhaler or nebulizer )  that you know would usually make you short of breath  If inhaler / neb not helping and getting worse > Prednisone 10 mg take  4 each am x 2 days,   2 each am x 2 days,  1 each am x 2 days and stop (refillable prescription)       03/17/2023 6 m  f/u ov/[place] office/[name] re: GOLD 4 maint on stiolto / flovent  no recent pred Chief Complaint  Patient presents with   Follow-up    Breathing better since last ov   Dyspnea:  no longer riding scooter as much / walking at [employer] whole store slow pace  Cough: better  Sleeping: flat bed/ one pillow  SABA use: hardly  ever  02:none   Lung cancer screening: referred again today    No obvious day to day or daytime  variability or assoc excess/ purulent sputum or mucus plugs or hemoptysis or cp or chest tightness, subjective wheeze or overt sinus or hb symptoms.   Sleeping  without nocturnal  or early am exacerbation  of respiratory  c/o's or need for noct saba. Also denies any obvious fluctuation of symptoms with weather or environmental changes or other aggravating or alleviating factors except as outlined above   No unusual exposure hx or h/o childhood pna/ asthma or knowledge of premature birth.  Current Allergies, Complete Past Medical History, Past Surgical History, Family History, and Social History were reviewed in [employer] record.  ROS  The following are not active complaints unless bolded Hoarseness, sore throat, dysphagia, dental problems, itching, sneezing,  nasal congestion or discharge of excess mucus or purulent secretions, ear ache,   fever, chills, sweats, unintended wt loss or wt gain, classically pleuritic or exertional cp,  orthopnea pnd or arm/hand swelling  or leg swelling, presyncope, palpitations, abdominal pain, anorexia, nausea, vomiting, diarrhea  or change in bowel habits or change in bladder habits, change in stools or change in urine, dysuria, hematuria,  rash, arthralgias, visual complaints, headache, numbness, weakness or ataxia or problems with walking or coordination,  change in mood or  memory.        Current Meds  Medication Sig   acetaminophen (TYLENOL) 500 MG tablet Take 1,000 mg by mouth every 6 (six) hours as needed.   albuterol (PROAIR HFA) 108 (90 Base) MCG/ACT inhaler 2 puffs every 4 hours as needed only  if your can't catch your breath   cyclobenzaprine (FLEXERIL) 10 MG tablet TAKE 1 TABLET BY MOUTH TWICE A DAY AS NEEDED FOR MUSCLE SPASM   famotidine (PEPCID) 20 MG tablet Take 1 tablet (20 mg total) by mouth daily after supper.   FLOVENT HFA 220 MCG/ACT inhaler TAKE 2 PUFFS BY MOUTH TWICE A DAY   ondansetron (ZOFRAN) 4 MG tablet On the day  of your colon prep, take 1 tablet (4 mg total) by mouth every 8 (eight) hours as needed for nausea or vomiting.   pantoprazole (PROTONIX) 40 MG tablet Take 30-60 min before first meal of the day   Tiotropium Bromide-Olodaterol (STIOLTO RESPIMAT) 2.5-2.5 MCG/ACT AERS INHALE 2 PUFFS BY MOUTH INTO THE LUNGS DAILY   Tiotropium Bromide-Olodaterol (STIOLTO RESPIMAT) 2.5-2.5 MCG/ACT AERS Inhale 2 puffs into the lungs daily.   valsartan-hydrochlorothiazide (DIOVAN-HCT) 80-12.5 MG tablet TAKE 1 TABLET BY MOUTH EVERY DAY                                                    Past Medical History:  Diagnosis Date   Chest discomfort    negative stress echocardiogram-01/2011   Chronic bronchitis    COPD (chronic obstructive pulmonary disease) (HCC)    DJD (degenerative joint disease)    Dyspnea    Gastro-esophageal reflux disease with esophagitis    GERD (gastroesophageal reflux disease)    Hyperlipidemia    Hypertension    Migraine    PVC (premature ventricular contraction)    Frequent   Tobacco abuse    Tobacco abuse    40 pack years       Objective:    Wts  03/17/2023  173  09/29/2022      176 09/01/2022        172  06/09/2022        176 12/16/2021      183  05/05/2021        192  01/28/2021         203 11/06/2020      200  09/08/2020       195   05/09/20 197 lb (89.4 kg)  04/30/20 197 lb (89.4 kg)  04/09/20 197 lb (89.4 kg)    Vital signs reviewed  03/17/2023  - Note at rest 02 sats  96% on RA   General appearance:    amb wm chronically ill appearing    HEENT :  Oropharynx  clear/ edentulous     NECK :  without JVD/Nodes/TM/ nl carotid upstrokes bilaterally   LUNGS: no acc muscle use,  Mod barrel  contour chest wall with distant insp/exp rhonchi  and  without cough on insp or exp maneuvers and mod  Hyperresonant  to  percussion bilaterally     CV:  RRR  no s3 or murmur or increase in P2, and no edema   ABD:  soft and nontender with pos mid insp Hoover's  in the  supine position. No bruits or organomegaly appreciated, bowel sounds nl  MS:   Ext warm without deformities or   obvious joint restrictions , calf tenderness, cyanosis or clubbing  SKIN: warm and dry without lesions    NEURO:  alert, approp, nl sensorium with  no motor or cerebellar deficits apparent.              Assessment

## 2023-03-17 ENCOUNTER — Encounter: Payer: Self-pay | Admitting: Internal Medicine

## 2023-03-17 ENCOUNTER — Ambulatory Visit (INDEPENDENT_AMBULATORY_CARE_PROVIDER_SITE_OTHER): Payer: 59 | Admitting: Internal Medicine

## 2023-03-17 VITALS — BP 110/71 | HR 77 | Ht 77.0 in | Wt 173.4 lb

## 2023-03-17 VITALS — BP 110/71 | HR 78 | Ht 77.0 in | Wt 173.0 lb

## 2023-03-17 DIAGNOSIS — G2581 Restless legs syndrome: Secondary | ICD-10-CM | POA: Diagnosis not present

## 2023-03-17 DIAGNOSIS — Z87891 Personal history of nicotine dependence: Secondary | ICD-10-CM | POA: Diagnosis not present

## 2023-03-17 DIAGNOSIS — J449 Chronic obstructive pulmonary disease, unspecified: Secondary | ICD-10-CM

## 2023-03-17 DIAGNOSIS — N1832 Chronic kidney disease, stage 3b: Secondary | ICD-10-CM | POA: Diagnosis not present

## 2023-03-17 DIAGNOSIS — R195 Other fecal abnormalities: Secondary | ICD-10-CM | POA: Diagnosis not present

## 2023-03-17 DIAGNOSIS — I10 Essential (primary) hypertension: Secondary | ICD-10-CM | POA: Diagnosis not present

## 2023-03-17 DIAGNOSIS — J42 Unspecified chronic bronchitis: Secondary | ICD-10-CM

## 2023-03-17 MED ORDER — PREDNISONE 10 MG PO TABS: ORAL_TABLET | ORAL | 11 refills | Status: DC

## 2023-03-17 NOTE — Patient Instructions (Signed)
Please continue to take medications as prescribed.  Please continue to follow low salt diet and perform moderate exercise/walking at least 150 mins/week. 

## 2023-03-17 NOTE — Patient Instructions (Addendum)
My office will be contacting you by phone for referral to lung cancer screening   - if you don't hear back from my office within one week please call us back or notify us thru MyChart and we'll address it right away.    Plan A = Automatic = Always=    stiolto/ flovent as you are   Plan B = Backup (to supplement plan A, not to replace it) Only use your albuterol inhaler as a rescue medication to be used if you can't catch your breath by resting or doing a relaxed purse lip breathing pattern.  - The less you use it, the better it will work when you need it. - Ok to use the inhaler up to 2 puffs  every 4 hours if you must but call for appointment if use goes up over your usual need - Don't leave home without it !!  (think of it like the spare tire for your car)   Plan C = Crisis (instead of Plan B but only if Plan B stops working) - only use your albuterol nebulizer if you first try Plan B and it fails to help > ok to use the nebulizer up to every 4 hours but if start needing it regularly call for immediate appointment   Plan D = Deltasone = Prednisone  If ABC and not helping > Prednisone 10 mg take  4 each am x 2 days,   2 each am x 2 days,  1 each am x 2 days and stop    Please schedule a follow up visit in 6 months but call sooner if needed-

## 2023-03-17 NOTE — Assessment & Plan Note (Signed)
Quit smoking 2017  - Spirometry  01/04/17  FEV1 0.99 (23%)  Ratio 0.28 p ? Prior  = Allergy profile 04/09/20  >  Eos 0. 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 04/09/2020  MM   Level 156  - 04/09/2020 rec pred 20 until better then 10 mg daily x one week wean - 05/09/2020  After extensive coaching inhaler device,  effectiveness =    90% > rec  continue flovent 220/ stiolto and completely  taper off prednisone  - PFT's  06/24/20  FEV1 1.15 (26 % ) ratio 0.27  p 35 % improvement from saba p stiolto prior to study with DLCO  11.45 (35%) corrects to 1.88 (46%)  for alv volume and FV curve classic curvature  - 09/08/2020  After extensive coaching inhaler device,  effectiveness =    90% so try breztri  - 10/09/2020  After extensive coaching inhaler device,  effectiveness =    90% with smi > change back to stiolto and pred ceiling 20/floor 10 and added protonix 40 mg q am ac as pseudowheeze on exam - 11/06/2020 added pm doses of pepcid/ gerd recs including bed blocks   - .11/06/2020 pred floor of 10 mg / ceiling of 20 mg -  11/06/2020   Walked RA  approx   250 ft  @ avg pace  stopped due to  Sob/ fatigue with sats still 96%  Typical of a pink puffer  -  12/15/2020  After extensive coaching inhaler device,  effectiveness =    90%  -  01/28/2021   Walked RA  approx   400 ft  @ slow to moderate pace  stopped due to  Sob  With sats 94% - 05/05/2021 resumed prednisone  D  2 a day until better then 1 a day x 5 days and stop. - 06/09/2022  continue stiolto  Plus flovent 220 2bid  - 09/01/2022  After extensive coaching inhaler device,  effectiveness =    90% with smi, 50% with hfa  - 09/01/2022 added back Prednisone as plan D = 6 day taper  - 09/01/2022   Walked on RA  x  1  lap(s) =  approx 150  ft  @ moderate pace, stopped due to sob with lowest 02 sats 95%   - 09/29/2022 refused walk    Group D (now reclassified as E) in terms of symptom/risk and laba/lama/ICS  therefore appropriate rx at this point >>>  continue dulera 200/  flovent 220 with approp saba

## 2023-03-17 NOTE — Assessment & Plan Note (Signed)
Quit smoking in 2017 On Stiolto Albuterol nebs and inhaler PRN Follows up with Dr Wert  Needs low-dose CT chest for lung cancer screening, but he wants to think about it. Also denies flu and pneumococcal vaccine. 

## 2023-03-17 NOTE — Assessment & Plan Note (Signed)
BP Readings from Last 1 Encounters:  03/17/23 110/71   Well-controlled with Valsartan-HCTZ Counseled for compliance with the medications Advised DASH diet and moderate exercise/walking as tolerated

## 2023-03-17 NOTE — Assessment & Plan Note (Signed)
Quit in 2017 > referred for lung cancer screening 09/29/2022 > again 03/17/2023   Eligible until 2032 / advised          Each maintenance medication was reviewed in detail including emphasizing most importantly the difference between maintenance and prns and under what circumstances the prns are to be triggered using an action plan format where appropriate.  Total time for H and P, chart review, counseling, reviewing hfa/smi device(s) and generating customized AVS unique to this office visit / same day charting = 28 min

## 2023-03-17 NOTE — Telephone Encounter (Addendum)
Pt came in. Aware we are now awaiting final May schedule. Advised once we get this schedule we will be calling him and he needs to answer his phone. He said schedule after May 10

## 2023-03-17 NOTE — Progress Notes (Signed)
Established Patient Office Visit  Subjective:  Patient ID: Scott Hubbard, male    DOB: [birth year]  Age: 67 y.o. MRN: [id number]  CC:  Chief Complaint  Patient presents with   Hypertension    Four month follow up    HPI Scott Hubbard is a 67 y.o. male with past medical history of COPD, hypertension, CKD and previous tobacco abuse who presents for f/u of his chronic medical conditions.  He complains of chronic leg cramps and forearm cramps, for which he is taking Flexeril now.  He has seen improvement in his leg cramps, but still has mild cramps in his UE.  He has tried ropinirole, but did not have adequate response.  HTN: BP is well-controlled. Takes medications regularly. Patient denies headache, dizziness, or palpitations.   COPD: He complains of chronic cough and dyspnea, for which he uses Stiolto and as needed albuterol neb and inhaler.  Followed by Dr. Melvyn Novas.  He is not on oral steroids now.  CKD: His S. Cr. and GFR had been improving in the last BMP.  He currently denies any dysuria or hematuria.  He has tried to increase his fluid intake.   Past Medical History:  Diagnosis Date   Chest discomfort    negative stress echocardiogram-01/2011   Chronic bronchitis    CKD (chronic kidney disease)    COPD (chronic obstructive pulmonary disease) (HCC)    DJD (degenerative joint disease)    Gastro-esophageal reflux disease with esophagitis    GERD (gastroesophageal reflux disease)    Hyperlipidemia    Hypertension    Migraine    PVC (premature ventricular contraction)    Frequent   Tobacco abuse    40 pack years    Past Surgical History:  Procedure Laterality Date   COLONOSCOPY WITH PROPOFOL N/A 02/04/2023   Eloise Harman, DO; inadequate prep    Family History  Problem Relation Age of Onset   Colon cancer Neg Hx     Social History   Socioeconomic History   Marital status: Legally Separated    Spouse name: Not on file   Number of children: 2   Years of  education: Not on file   Highest education level: Not on file  Occupational History   Occupation: unemployed    Employer: UNEMPLOYED  Tobacco Use   Smoking status: Former    Packs/day: 1.50    Years: 40.00    Additional pack years: 0.00    Total pack years: 60.00    Types: Cigarettes    Quit date: 04/27/2016    Years since quitting: 6.8   Smokeless tobacco: Never  Vaping Use   Vaping Use: Never used  Substance and Sexual Activity   Alcohol use: No   Drug use: No   Sexual activity: Not on file  Other Topics Concern   Not on file  Social History Narrative   Lives with girlfriend.  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     Social Determinants of Health   Financial Resource Strain: Low Risk  (09/06/2022)   Overall Financial Resource Strain (CARDIA)    Difficulty of Paying Living Expenses: Not hard at all  Food Insecurity: No Food Insecurity (09/06/2022)   Hunger Vital Sign    Worried About Running Out of Food in the Last Year: Never true    Ran Out of Food in the Last Year: Never true  Transportation Needs: No Transportation Needs (09/06/2022)   PRAPARE - Hydrologist (  Medical): No    Lack of Transportation (Non-Medical): No  Physical Activity: Sufficiently Active (09/06/2022)   Exercise Vital Sign    Days of Exercise per Week: 5 days    Minutes of Exercise per Session: 30 min  Stress: No Stress Concern Present (09/06/2022)   [place]    Feeling of Stress : Not at all  Social Connections: Moderately Integrated (09/06/2022)   Social Connection and Isolation Panel [NHANES]    Frequency of Communication with Friends and Family: More than three times a week    Frequency of Social Gatherings with Friends and Family: Twice a week    Attends Religious Services: 1 to 4 times per year    Active Member of [employer] or Organizations: No    Attends Archivist Meetings: Never    Marital Status:  Living with partner  Intimate Partner Violence: Not At Risk (09/06/2022)   Humiliation, Afraid, Rape, and Kick questionnaire    Fear of Current or Ex-Partner: No    Emotionally Abused: No    Physically Abused: No    Sexually Abused: No    Outpatient Medications Prior to Visit  Medication Sig Dispense Refill   acetaminophen (TYLENOL) 500 MG tablet Take 1,000 mg by mouth every 6 (six) hours as needed.     albuterol (PROAIR HFA) 108 (90 Base) MCG/ACT inhaler 2 puffs every 4 hours as needed only  if your can't catch your breath 18 g 11   cyclobenzaprine (FLEXERIL) 10 MG tablet TAKE 1 TABLET BY MOUTH TWICE A DAY AS NEEDED FOR MUSCLE SPASM 60 tablet 2   famotidine (PEPCID) 20 MG tablet Take 1 tablet (20 mg total) by mouth daily after supper. 90 tablet 3   FLOVENT HFA 220 MCG/ACT inhaler TAKE 2 PUFFS BY MOUTH TWICE A DAY 36 each 3   ondansetron (ZOFRAN) 4 MG tablet On the day of your colon prep, take 1 tablet (4 mg total) by mouth every 8 (eight) hours as needed for nausea or vomiting. 5 tablet 0   pantoprazole (PROTONIX) 40 MG tablet Take 30-60 min before first meal of the day 30 tablet 2   Tiotropium Bromide-Olodaterol (STIOLTO RESPIMAT) 2.5-2.5 MCG/ACT AERS INHALE 2 PUFFS BY MOUTH INTO THE LUNGS DAILY 4 g 11   Tiotropium Bromide-Olodaterol (STIOLTO RESPIMAT) 2.5-2.5 MCG/ACT AERS Inhale 2 puffs into the lungs daily. 4 g 11   valsartan-hydrochlorothiazide (DIOVAN-HCT) 80-12.5 MG tablet TAKE 1 TABLET BY MOUTH EVERY DAY 90 tablet 3   No facility-administered medications prior to visit.    No Known Allergies  ROS Review of Systems  Constitutional:  Negative for chills and fever.  HENT:  Negative for sinus pressure, sinus pain and sore throat.   Eyes:  Negative for pain and discharge.  Respiratory:  Positive for cough and shortness of breath (chronic).   Cardiovascular:  Negative for chest pain and palpitations.  Gastrointestinal:  Negative for constipation, diarrhea, nausea and vomiting.   Endocrine: Negative for polydipsia and polyuria.  Genitourinary:  Negative for dysuria and hematuria.  Musculoskeletal:  Negative for neck pain and neck stiffness.  Skin:  Positive for rash (b/l UE).  Neurological:  Negative for dizziness, weakness, numbness and headaches.  Psychiatric/Behavioral:  Negative for agitation and behavioral problems.       Objective:    Physical Exam Vitals reviewed.  Constitutional:      General: He is not in acute distress.    Appearance: He is not diaphoretic.  HENT:  Head: Normocephalic and atraumatic.     Mouth/Throat:     Mouth: Mucous membranes are moist.  Eyes:     General: No scleral icterus.    Extraocular Movements: Extraocular movements intact.  Cardiovascular:     Rate and Rhythm: Normal rate and regular rhythm.     Pulses: Normal pulses.     Heart sounds: Normal heart sounds. No murmur heard.    No friction rub. No gallop.  Pulmonary:     Effort: Pulmonary effort is normal.     Breath sounds: No wheezing or rales.  Abdominal:     General: Bowel sounds are normal.     Palpations: Abdomen is soft.     Tenderness: There is no abdominal tenderness.     Hernia: A hernia (Umbilical) is present.  Musculoskeletal:        General: No swelling or signs of injury.     Cervical back: Neck supple. No tenderness.     Right lower leg: No edema.     Left lower leg: No edema.  Skin:    General: Skin is warm.     Findings: Rash (Erythematous plaques over b/l UE) present.  Neurological:     General: No focal deficit present.     Mental Status: He is alert and oriented to person, place, and time.     Sensory: No sensory deficit.     Motor: No weakness.  Psychiatric:        Mood and Affect: Mood normal.        Behavior: Behavior normal.     BP 110/71 (BP Location: Left Arm, Patient Position: Sitting, Cuff Size: Normal)   Pulse 77   Ht 6\' 5"  (1.956 m)   Wt 173 lb 6.4 oz (78.7 kg)   SpO2 93%   BMI 20.56 kg/m  Wt Readings from Last  3 Encounters:  03/17/23 173 lb (78.5 kg)  03/17/23 173 lb 6.4 oz (78.7 kg)  03/09/23 173 lb 12.8 oz (78.8 kg)    Lab Results  Component Value Date   TSH 0.693 06/09/2022   Lab Results  Component Value Date   WBC 11.7 (H) 06/09/2022   HGB 13.2 06/09/2022   HCT 40.0 06/09/2022   MCV 88 06/09/2022   PLT 190 06/09/2022   Lab Results  Component Value Date   NA 137 02/02/2023   K 4.4 02/02/2023   CO2 27 02/02/2023   GLUCOSE 95 02/02/2023   BUN 25 (H) 02/02/2023   CREATININE 1.88 (H) 02/02/2023   BILITOT 0.5 06/09/2022   ALKPHOS 71 06/09/2022   AST 16 06/09/2022   ALT 11 06/09/2022   PROT 6.7 06/09/2022   ALBUMIN 4.4 06/09/2022   CALCIUM 8.8 (L) 02/02/2023   ANIONGAP 8 02/02/2023   EGFR 42 (L) 10/25/2022   Lab Results  Component Value Date   CHOL 154 06/09/2022   Lab Results  Component Value Date   HDL 35 (L) 06/09/2022   Lab Results  Component Value Date   LDLCALC 103 (H) 06/09/2022   Lab Results  Component Value Date   TRIG 84 06/09/2022   Lab Results  Component Value Date   CHOLHDL 4.4 06/09/2022   Lab Results  Component Value Date   HGBA1C 5.7 (H) 06/09/2022      Assessment & Plan:   Problem List Items Addressed This Visit       Cardiovascular and Mediastinum   Essential hypertension - Primary    BP Readings from Last 1 Encounters:  03/17/23 110/71  Well-controlled with Valsartan-HCTZ Counseled for compliance with the medications Advised DASH diet and moderate exercise/walking as tolerated        Respiratory   Chronic bronchitis (HCC)    Quit smoking in 2017 On Stiolto Albuterol nebs and inhaler PRN Follows up with Dr Melvyn Novas  Needs low-dose CT chest for lung cancer screening, but he wants to think about it. Also denies flu and pneumococcal vaccine.        Genitourinary   Stage 3b chronic kidney disease ([place])    AKI on CKD in the past On ARB and HCTZ Avoid nephrotoxic agents Reviewed US renal from ER Checked BMP from chart,  stable GFR        Other   Restless legs syndrome    Better with Flexeril PRN for leg cramps Uncontrolled with Requip 1 mg qHS      Positive colorectal cancer screening using Cologuard test    Had poor prep on colonoscopy, needs repeat colonoscopy      No orders of the defined types were placed in this encounter.   Follow-up: Return in about 4 months (around 07/17/2023) for Annual physical.    Lindell Spar, MD

## 2023-03-18 NOTE — Assessment & Plan Note (Signed)
Better with Flexeril PRN for leg cramps Uncontrolled with Requip 1 mg qHS

## 2023-03-18 NOTE — Assessment & Plan Note (Signed)
AKI on CKD in the past On ARB and HCTZ Avoid nephrotoxic agents Reviewed US renal from ER Checked BMP from chart, stable GFR

## 2023-03-18 NOTE — Assessment & Plan Note (Signed)
Had poor prep on colonoscopy, needs repeat colonoscopy

## 2023-03-29 ENCOUNTER — Encounter: Payer: Self-pay | Admitting: *Deleted

## 2023-03-29 ENCOUNTER — Other Ambulatory Visit: Payer: Self-pay | Admitting: *Deleted

## 2023-03-29 ENCOUNTER — Telehealth: Payer: Self-pay | Admitting: *Deleted

## 2023-03-29 MED ORDER — PEG 3350-KCL-NA BICARB-NACL 420 G PO SOLR: 4000.0000 mL | Freq: Once | ORAL | 0 refills | Status: AC

## 2023-03-29 NOTE — Telephone Encounter (Signed)
[employer] PA: Notification or Prior Authorization is not required for the requested services You are not required to submit a notification/prior authorization based on the information provided. The number above acknowledges your inquiry and our response. Please write this number down and refer to it for future inquiries. If you still wish to submit your request for review, please select the Continue with Submission button below. Decision ID #: [id number]

## 2023-03-29 NOTE — Telephone Encounter (Signed)
LMOVM to return call : Pre-op scheduled for 04/27/23, Wednesday at 9:30 am. Will also mail letter

## 2023-04-07 ENCOUNTER — Other Ambulatory Visit: Payer: Self-pay | Admitting: Internal Medicine

## 2023-04-10 ENCOUNTER — Other Ambulatory Visit: Payer: Self-pay | Admitting: Internal Medicine

## 2023-04-10 DIAGNOSIS — G2581 Restless legs syndrome: Secondary | ICD-10-CM

## 2023-04-12 ENCOUNTER — Ambulatory Visit: Payer: Self-pay | Admitting: *Deleted

## 2023-04-12 ENCOUNTER — Encounter: Payer: Self-pay | Admitting: *Deleted

## 2023-04-12 NOTE — Patient Outreach (Signed)
  Care Coordination   Initial Visit Note   04/12/2023 Name: Scott Hubbard MRN: [id number] DOB: [birth year]  Scott Hubbard is a 67 y.o. year old male who sees Anabel Halon, MD for primary care. I spoke with  Scott Hubbard by phone today.  What matters to the patients health and wellness today?  Initial assessment, MD visits, co pays, MD bills discussed Agrees to RN CM follow up call    Goals Addressed             This Visit's Progress    [facility] care coordination services   On track    Interventions Today    Flowsheet Row Most Recent Value  Chronic Disease   Chronic disease during today's visit Other  General Interventions   General Interventions Discussed/Reviewed --  [insurance co pays, insurance medical transportation, [facility] resources]  Doctor Visits Discussed/Reviewed Doctor Visits Discussed, PCP, Specialist  [Discussed the importance of pcp, difference in pcp & specialists]  PCP/Specialist Visits Compliance with follow-up visit  [confirms he see Dr Dawna Part at [facility]]  Nutrition Interventions   Nutrition Discussed/Reviewed Nutrition Discussed, Portion sizes  Pharmacy Interventions   Pharmacy Dicussed/Reviewed Pharmacy Topics Discussed  [assessed for medication cost, adherence concerns - None]              SDOH assessments and interventions completed:  Yes  SDOH Interventions Today    Flowsheet Row Most Recent Value  SDOH Interventions   Food Insecurity Interventions Intervention Not Indicated  Housing Interventions Intervention Not Indicated  Transportation Interventions Intervention Not Indicated  Utilities Interventions Intervention Not Indicated  Financial Strain Interventions Intervention Not Indicated  Stress Interventions Intervention Not Indicated        Care Coordination Interventions:  Yes, provided   Follow up plan: Follow up call scheduled for 04/14/23    Encounter Outcome:  Pt. Visit Completed   Stace Peace L. Noelle Penner, RN, BSN,  CCM [facility] Care Management Community Coordinator Office number [phone]

## 2023-04-12 NOTE — Patient Instructions (Signed)
Visit Information  Thank you for taking time to visit with me today. Please don't hesitate to contact me if I can be of assistance to you.   Following are the goals we discussed today:   Goals Addressed             This Visit's Progress    [facility] care coordination services   On track    Interventions Today    Flowsheet Row Most Recent Value  Chronic Disease   Chronic disease during today's visit Other  General Interventions   General Interventions Discussed/Reviewed --  [insurance co pays, insurance medical transportation, [facility] resources]  Doctor Visits Discussed/Reviewed Doctor Visits Discussed, PCP, Specialist  [Discussed the importance of pcp, difference in pcp & specialists]  PCP/Specialist Visits Compliance with follow-up visit  [confirms he see Dr Dawna Part at [facility]]  Nutrition Interventions   Nutrition Discussed/Reviewed Nutrition Discussed, Portion sizes  Pharmacy Interventions   Pharmacy Dicussed/Reviewed Pharmacy Topics Discussed  Pearletha Furl for medication cost, adherence concerns - None]              Our next appointment is by telephone on 04/14/23 at 1 pm  Please call the care guide team at [phone] if you need to cancel or reschedule your appointment.   If you are experiencing a Mental Health or Behavioral Health Crisis or need someone to talk to, please call the Suicide and Crisis Lifeline: 988 call the [place] National Suicide Prevention Lifeline: 518 102 9328 or TTY: 210 748 3790 TTY 571-609-9580) to talk to a trained counselor call 1-800-273-TALK (toll free, 24 hour hotline) call the [facility]: [phone] call 911   The patient verbalized understanding of instructions, educational materials, and care plan provided today and DECLINED offer to receive copy of patient instructions, educational materials, and care plan.   The patient has been provided with contact information for the care management team and has been  advised to call with any health related questions or concerns.   Kevonna Nolte L. Noelle Penner, RN, BSN, CCM [facility] Care Management Community Coordinator Office number [phone]

## 2023-04-26 ENCOUNTER — Other Ambulatory Visit: Payer: Self-pay | Admitting: Internal Medicine

## 2023-04-27 ENCOUNTER — Encounter (HOSPITAL_COMMUNITY)
Admission: RE | Admit: 2023-04-27 | Discharge: 2023-04-27 | Disposition: A | Discharge status: Home / Self Care | Payer: 59 | Source: Ambulatory Visit | Attending: Internal Medicine | Admitting: Internal Medicine

## 2023-04-27 ENCOUNTER — Encounter (HOSPITAL_COMMUNITY): Payer: Self-pay

## 2023-04-27 NOTE — Patient Instructions (Signed)
Scott Hubbard  04/27/2023     @PREFPERIOPPHARMACY @   Your procedure is scheduled on 04/29/2323.  Report to [facility] at 11:15 A.M.  Call this number if you have problems the morning of surgery:  [phone]  If you experience any cold or flu symptoms such as cough, fever, chills, shortness of breath, etc. between now and your scheduled surgery, please notify us at the above number.   Remember:   Follow the diet and prep instructions given to you by Dr Queen [facility] office     Take these medicines the morning of surgery with A SIP OF WATER : Pepcid Flexeril Pantoprqzole and Prednisone    Do not wear jewelry, make-up or nail polish.  Do not wear lotions, powders, or perfumes, or deodorant.  Do not shave 48 hours prior to surgery.  Men may shave face and neck.  Do not bring valuables to the hospital.  [facility] is not responsible for any belongings or valuables.  Contacts, dentures or bridgework may not be worn into surgery.  Leave your suitcase in the car.  After surgery it may be brought to your room.  For patients admitted to the hospital, discharge time will be determined by your treatment team.  Patients discharged the day of surgery will not be allowed to drive home.   Name and phone number of your driver:   family Special instructions:  N/A  Please read over the following fact sheets that you were given. Care and Recovery After Surgery  Colonoscopy, Adult A colonoscopy is a procedure to look at the entire large intestine. This procedure is done using a long, thin, flexible tube that has a camera on the end. You may have a colonoscopy: As a part of normal colorectal screening. If you have certain symptoms, such as: A low number of red blood cells in your blood (anemia). Diarrhea that does not go away. Pain in your abdomen. Blood in your stool. A colonoscopy can help screen for and diagnose medical problems, including: An abnormal growth of cells or tissue  (tumor). Abnormal growths within the lining of your intestine (polyps). Inflammation. Areas of bleeding. Tell your health care provider about: Any allergies you have. All medicines you are taking, including vitamins, herbs, eye drops, creams, and over-the-counter medicines. Any problems you or family members have had with anesthetic medicines. Any bleeding problems you have. Any surgeries you have had. Any medical conditions you have. Any problems you have had with having bowel movements. Whether you are pregnant or may be pregnant. What are the risks? Generally, this is a safe procedure. However, problems may occur, including: Bleeding. Damage to your intestine. Allergic reactions to medicines given during the procedure. Infection. This is rare. What happens before the procedure? Eating and drinking restrictions Follow instructions from your health care provider about eating or drinking restrictions, which may include: A few days before the procedure: Follow a low-fiber diet. Avoid nuts, seeds, dried fruit, raw fruits, and vegetables. 1-3 days before the procedure: Eat only gelatin dessert or ice pops. Drink only clear liquids, such as water, clear juice, clear broth or bouillon, black coffee or tea, or clear soft drinks or sports drinks. Avoid liquids that contain red or purple dye. The day of the procedure: Do not eat solid foods. You may continue to drink clear liquids until up to 2 hours before the procedure. Do not eat or drink anything starting 2 hours before the procedure, or within the time period that your health  care provider recommends. Bowel prep If you were prescribed a bowel prep to take by mouth (orally) to clean out your colon: Take it as told by your health care provider. Starting the day before your procedure, you will need to drink a large amount of liquid medicine. The liquid will cause you to have many bowel movements of loose stool until your stool becomes  almost clear or light green. If your skin or the opening between the buttocks (anus) gets irritated from diarrhea, you may relieve the irritation using: Wipes with medicine in them, such as adult wet wipes with aloe and vitamin E. A product to soothe skin, such as petroleum jelly. If you vomit while drinking the bowel prep: Take a break for up to 60 minutes. Begin the bowel prep again. Call your health care provider if you keep vomiting or you cannot take the bowel prep without vomiting. To clean out your colon, you may also be given: Laxative medicines. These help you have a bowel movement. Instructions for enema use. An enema is liquid medicine injected into your rectum. Medicines Ask your health care provider about: Changing or stopping your regular medicines or supplements. This is especially important if you are taking iron supplements, diabetes medicines, or blood thinners. Taking medicines such as aspirin and ibuprofen. These medicines can thin your blood. Do not take these medicines unless your health care provider tells you to take them. Taking over-the-counter medicines, vitamins, herbs, and supplements. General instructions Ask your health care provider what steps will be taken to help prevent infection. These may include washing skin with a germ-killing soap. If you will be going home right after the procedure, plan to have a responsible adult: Take you home from the hospital or clinic. You will not be allowed to drive. Care for you for the time you are told. What happens during the procedure?  An IV will be inserted into one of your veins. You will be given a medicine to make you fall asleep (general anesthetic). You will lie on your side with your knees bent. A lubricant will be put on the tube. Then the tube will be: Inserted into your anus. Gently eased through all parts of your large intestine. Air will be sent into your colon to keep it open. This may cause some  pressure or cramping. Images will be taken with the camera and will appear on a screen. A small tissue sample may be removed to be looked at under a microscope (biopsy). The tissue may be sent to a lab for testing if any signs of problems are found. If small polyps are found, they may be removed and checked for cancer cells. When the procedure is finished, the tube will be removed. The procedure may vary among health care providers and hospitals. What happens after the procedure? Your blood pressure, heart rate, breathing rate, and blood oxygen level will be monitored until you leave the hospital or clinic. You may have a small amount of blood in your stool. You may pass gas and have mild cramping or bloating in your abdomen. This is caused by the air that was used to open your colon during the exam. If you were given a sedative during the procedure, it can affect you for several hours. Do not drive or operate machinery until your health care provider says that it is safe. It is up to you to get the results of your procedure. Ask your health care provider, or the department that is doing  the procedure, when your results will be ready. Summary A colonoscopy is a procedure to look at the entire large intestine. Follow instructions from your health care provider about eating and drinking before the procedure. If you were prescribed an oral bowel prep to clean out your colon, take it as told by your health care provider. During the colonoscopy, a flexible tube with a camera on its end is inserted into the anus and then passed into all parts of the large intestine. This information is not intended to replace advice given to you by your health care provider. Make sure you discuss any questions you have with your health care provider. Document Revised: 11/30/2021 Document Reviewed: 07/29/2021 Elsevier Patient Education  [place] Anesthesia refers to the  techniques, procedures, and medicines that help a person stay safe and comfortable during surgery. Monitored anesthesia care, or sedation, is one type of anesthesia. You may have sedation if you do not need to be asleep for your procedure. Procedures that use sedation may include: Surgery to remove cataracts from your eyes. A dental procedure. A biopsy. This is when a tissue sample is removed and looked at under a microscope. You will be watched closely during your procedure. Your level of sedation or type of anesthesia may be changed to fit your needs. Tell a health care provider about: Any allergies you have. All medicines you are taking, including vitamins, herbs, eye drops, creams, and over-the-counter medicines. Any problems you or family members have had with anesthesia. Any bleeding problems you have. Any surgeries you have had. Any medical conditions or illnesses you have. This includes sleep apnea, cough, fever, or the flu. Whether you are pregnant or may be pregnant. Whether you use cigarettes, alcohol, or drugs. Any use of steroids, whether by mouth or as a cream. What are the risks? Your health care provider will talk with you about risks. These may include: Getting too much medicine (oversedation). Nausea. Allergic reactions to medicines. Trouble breathing. If this happens, a breathing tube may be used to help you breathe. It will be removed when you are awake and breathing on your own. Heart trouble. Lung trouble. Confusion that gets better with time (emergence delirium). What happens before the procedure? When to stop eating and drinking Follow instructions from your health care provider about what you may eat and drink. These may include: 8 hours before your procedure Stop eating most foods. Do not eat meat, fried foods, or fatty foods. Eat only light foods, such as toast or crackers. All liquids are okay except energy drinks and alcohol. 6 hours before your  procedure Stop eating. Drink only clear liquids, such as water, clear fruit juice, black coffee, plain tea, and sports drinks. Do not drink energy drinks or alcohol. 2 hours before your procedure Stop drinking all liquids. You may be allowed to take medicines with small sips of water. If you do not follow your health care provider's instructions, your procedure may be delayed or canceled. Medicines Ask your health care provider about: Changing or stopping your regular medicines. These include any diabetes medicines or blood thinners you take. Taking medicines such as aspirin and ibuprofen. These medicines can thin your blood. Do not take them unless your health care provider tells you to. Taking over-the-counter medicines, vitamins, herbs, and supplements. Testing You may have an exam or testing. You may have a blood or urine sample taken. General instructions Do not use any products that contain nicotine or tobacco  for at least 4 weeks before the procedure. These products include cigarettes, chewing tobacco, and vaping devices, such as e-cigarettes. If you need help quitting, ask your health care provider. If you will be going home right after the procedure, plan to have a responsible adult: Take you home from the hospital or clinic. You will not be allowed to drive. Care for you for the time you are told. What happens during the procedure?  Your blood pressure, heart rate, breathing, level of pain, and blood oxygen level will be monitored. An IV will be inserted into one of your veins. You may be given: A sedative. This helps you relax. Anesthesia. This will: Numb certain areas of your body. Make you fall asleep for surgery. You will be given medicines as needed to keep you comfortable. The more medicine you are given, the deeper your level of sedation will be. Your level of sedation may be changed to fit your needs. There are three levels of sedation: Mild sedation. At this level,  you may feel awake and relaxed. You will be able to follow directions. Moderate sedation. At this level, you will be sleepy. You may not remember the procedure. Deep sedation. At this level, you will be asleep. You will not remember the procedure. How you get the medicines will depend on your age and the procedure. They may be given as: A pill. This may be taken by mouth (orally) or inserted into the rectum. An injection. This may be into a vein or muscle. A spray through the nose. After your procedure is over, the medicine will be stopped. The procedure may vary among health care providers and hospitals. What happens after the procedure? Your blood pressure, heart rate, breathing rate, and blood oxygen level will be monitored until you leave the hospital or clinic. You may feel sleepy, clumsy, or nauseous. You may not remember what happened during or after the procedure. Sedation can affect you for several hours. Do not drive or use machinery until your health care provider says that it is safe. This information is not intended to replace advice given to you by your health care provider. Make sure you discuss any questions you have with your health care provider. Document Revised: 05/03/2022 Document Reviewed: 05/03/2022 Elsevier Patient Education  [place].

## 2023-04-29 ENCOUNTER — Ambulatory Visit (HOSPITAL_COMMUNITY)
Admission: RE | Admit: 2023-04-29 | Discharge: 2023-04-29 | Disposition: A | Discharge status: Home / Self Care | Payer: 59 | Attending: Internal Medicine | Admitting: Internal Medicine

## 2023-04-29 ENCOUNTER — Encounter (HOSPITAL_COMMUNITY)
Admission: RE | Disposition: A | Discharge status: Home / Self Care | Payer: Self-pay | Source: Home / Self Care | Attending: Internal Medicine

## 2023-04-29 ENCOUNTER — Ambulatory Visit (HOSPITAL_COMMUNITY): Payer: 59 | Admitting: Certified Registered"

## 2023-04-29 ENCOUNTER — Ambulatory Visit (HOSPITAL_BASED_OUTPATIENT_CLINIC_OR_DEPARTMENT_OTHER): Payer: 59 | Admitting: Certified Registered"

## 2023-04-29 ENCOUNTER — Encounter (HOSPITAL_COMMUNITY): Payer: Self-pay

## 2023-04-29 DIAGNOSIS — N189 Chronic kidney disease, unspecified: Secondary | ICD-10-CM | POA: Insufficient documentation

## 2023-04-29 DIAGNOSIS — K635 Polyp of colon: Secondary | ICD-10-CM | POA: Diagnosis not present

## 2023-04-29 DIAGNOSIS — I129 Hypertensive chronic kidney disease with stage 1 through stage 4 chronic kidney disease, or unspecified chronic kidney disease: Secondary | ICD-10-CM | POA: Diagnosis not present

## 2023-04-29 DIAGNOSIS — Q438 Other specified congenital malformations of intestine: Secondary | ICD-10-CM | POA: Insufficient documentation

## 2023-04-29 DIAGNOSIS — Z1211 Encounter for screening for malignant neoplasm of colon: Secondary | ICD-10-CM | POA: Insufficient documentation

## 2023-04-29 DIAGNOSIS — D126 Benign neoplasm of colon, unspecified: Secondary | ICD-10-CM

## 2023-04-29 DIAGNOSIS — J449 Chronic obstructive pulmonary disease, unspecified: Secondary | ICD-10-CM | POA: Insufficient documentation

## 2023-04-29 DIAGNOSIS — Z87891 Personal history of nicotine dependence: Secondary | ICD-10-CM | POA: Insufficient documentation

## 2023-04-29 DIAGNOSIS — R195 Other fecal abnormalities: Secondary | ICD-10-CM | POA: Insufficient documentation

## 2023-04-29 DIAGNOSIS — I209 Angina pectoris, unspecified: Secondary | ICD-10-CM | POA: Diagnosis not present

## 2023-04-29 DIAGNOSIS — D122 Benign neoplasm of ascending colon: Secondary | ICD-10-CM | POA: Diagnosis not present

## 2023-04-29 DIAGNOSIS — D120 Benign neoplasm of cecum: Secondary | ICD-10-CM | POA: Diagnosis not present

## 2023-04-29 HISTORY — PX: POLYPECTOMY: SHX5525

## 2023-04-29 HISTORY — PX: COLONOSCOPY WITH PROPOFOL: SHX5780

## 2023-04-29 SURGERY — COLONOSCOPY WITH PROPOFOL
Anesthesia: General

## 2023-04-29 MED ORDER — EPHEDRINE SULFATE-NACL 50-0.9 MG/10ML-% IV SOSY
PREFILLED_SYRINGE | INTRAVENOUS | Status: DC | PRN
  Administered 2023-04-29: 10 mg via INTRAVENOUS
  Administered 2023-04-29: 5 mg via INTRAVENOUS
  Administered 2023-04-29: 10 mg via INTRAVENOUS

## 2023-04-29 MED ORDER — LIDOCAINE HCL (CARDIAC) PF 100 MG/5ML IV SOSY
PREFILLED_SYRINGE | INTRAVENOUS | Status: DC | PRN
  Administered 2023-04-29: 50 mg via INTRATRACHEAL

## 2023-04-29 MED ORDER — LACTATED RINGERS IV SOLN: INTRAVENOUS | Status: DC

## 2023-04-29 MED ORDER — PHENYLEPHRINE 80 MCG/ML (10ML) SYRINGE FOR IV PUSH (FOR BLOOD PRESSURE SUPPORT)
PREFILLED_SYRINGE | INTRAVENOUS | Status: DC | PRN
  Administered 2023-04-29: 160 ug via INTRAVENOUS
  Administered 2023-04-29: 80 ug via INTRAVENOUS
  Administered 2023-04-29: 160 ug via INTRAVENOUS

## 2023-04-29 MED ORDER — PROPOFOL 10 MG/ML IV BOLUS
INTRAVENOUS | Status: DC | PRN
  Administered 2023-04-29: 90 mg via INTRAVENOUS

## 2023-04-29 MED ORDER — PROPOFOL 500 MG/50ML IV EMUL
INTRAVENOUS | Status: DC | PRN
  Administered 2023-04-29: 150 ug/kg/min via INTRAVENOUS

## 2023-04-29 NOTE — Op Note (Signed)
[facility] Patient Name: Scott Hubbard Procedure Date: 04/29/2023 12:45 PM MRN: [id number] Date of Birth: 1956/01/15 Attending MD: Hennie Duos. Marletta Lor , [place], [id number] CSN: [id number] Age: 67 Admit Type: Outpatient Procedure:                Colonoscopy Indications:              Positive Cologuard test Providers:                Hennie Duos. Marletta Lor, DO, Angelica Ran, Pandora Leiter,                            Technician Referring MD:              Medicines:                See the Anesthesia note for documentation of the                            administered medications Complications:            No immediate complications. Estimated Blood Loss:     Estimated blood loss was minimal. Procedure:                Pre-Anesthesia Assessment:                           - The anesthesia plan was to use monitored                            anesthesia care (MAC).                           After obtaining informed consent, the colonoscope                            was passed under direct vision. Throughout the                            procedure, the patient's blood pressure, pulse, and                            oxygen saturations were monitored continuously. The                            [id number]-HQ190L ([id number]) scope was introduced through                            the anus and advanced to the the cecum, identified                            by appendiceal orifice and ileocecal valve. The                            colonoscopy was technically difficult and complex                            due to inadequate bowel prep, a redundant colon  and                            significant looping. Successful completion of the                            procedure was aided by applying abdominal pressure                            and lavage. The patient tolerated the procedure                            well. The quality of the bowel preparation was                            evaluated using the BBPS [facility]  Bowel Preparation                            Scale) with scores of: Right Colon = 1 (portion of                            mucosa seen, but other areas not well seen due to                            staining, residual stool and/or opaque liquid),                            Transverse Colon = 1 (portion of mucosa seen, but                            other areas not well seen due to staining, residual                            stool and/or opaque liquid) and Left Colon = 1                            (portion of mucosa seen, but other areas not well                            seen due to staining, residual stool and/or opaque                            liquid). The total BBPS score equals 3. The quality                            of the bowel preparation was inadequate. Scope In: 12:53:25 PM Scope Out: 1:18:48 PM Scope Withdrawal Time: 0 hours 16 minutes 14 seconds  Total Procedure Duration: 0 hours 25 minutes 23 seconds  Findings:      Four sessile polyps were found in the ascending colon and cecum. The       polyps were 5 to 8 mm in size. These polyps were removed with a cold  snare. Resection and retrieval were complete.      A 17 mm polyp was found in the cecum. The polyp was sessile. The polyp       was removed piecemal fashion with a cold snare. Resection and retrieval       were complete.      Extensive amounts of semi-solid stool was found in the entire colon,       precluding visualization. Lavage of the area was performed using copious       amounts, resulting in incomplete clearance with continued poor       visualization. Impression:               - Preparation of the colon was inadequate.                           - Four 5 to 8 mm polyps in the ascending colon and                            in the cecum, removed with a cold snare. Resected                            and retrieved.                           - One 17 mm polyp in the cecum, removed with a cold                             snare. Resected and retrieved.                           - Stool in the entire examined colon. Moderate Sedation:      Per Anesthesia Care Recommendation:           - Patient has a contact number available for                            emergencies. The signs and symptoms of potential                            delayed complications were discussed with the                            patient. Return to normal activities tomorrow.                            Written discharge instructions were provided to the                            patient.                           - Resume previous diet.                           - Continue present medications.                           -  Await pathology results.                           - Repeat colonoscopy in 3-6 months because the                            bowel preparation was poor.                           - Return to GI clinic in 3 months. Procedure Code(s):        --- Professional ---                           [phone], Colonoscopy, flexible; with removal of                            tumor(s), polyp(s), or other lesion(s) by snare                            technique Diagnosis Code(s):        --- Professional ---                           D12.2, Benign neoplasm of ascending colon                           D12.0, Benign neoplasm of cecum                           R19.5, Other fecal abnormalities CPT copyright 2022 American Medical Association. All rights reserved. The codes documented in this report are preliminary and upon coder review may  be revised to meet current compliance requirements. Hennie Duos. Marletta Lor, DO Hennie Duos. Marletta Lor, DO 04/29/2023 1:22:44 PM This report has been signed electronically. Number of Addenda: 0

## 2023-04-29 NOTE — Transfer of Care (Signed)
Immediate Anesthesia Transfer of Care Note  Patient: Scott Hubbard  Procedure(s) Performed: COLONOSCOPY WITH PROPOFOL POLYPECTOMY  Patient Location: Endoscopy Unit  Anesthesia Type:General  Level of Consciousness: awake, alert , oriented, and patient cooperative  Airway & Oxygen Therapy: Patient Spontanous Breathing  Post-op Assessment: Report given to RN, Post -op Vital signs reviewed and stable, and Patient moving all extremities  Post vital signs: Reviewed and stable  Last Vitals:  Vitals Value Taken Time  BP    Temp    Pulse    Resp    SpO2      Last Pain:  Vitals:   04/29/23 1248  TempSrc:   PainSc: 0-No pain         Complications: No notable events documented.

## 2023-04-29 NOTE — Anesthesia Preprocedure Evaluation (Signed)
Anesthesia Evaluation  Patient identified by MRN, date of birth, ID band Patient awake    Reviewed: Allergy & Precautions, H&P , NPO status , Patient's Chart, lab work & pertinent test results, reviewed documented beta blocker date and time   Airway Mallampati: II  TM Distance: >3 FB Neck ROM: full    Dental no notable dental hx.    Pulmonary neg pulmonary ROS, COPD, former smoker   Pulmonary exam normal breath sounds clear to auscultation       Cardiovascular Exercise Tolerance: Good hypertension, + angina  + DOE  negative cardio ROS  Rhythm:regular Rate:Normal     Neuro/Psych  Headaches negative neurological ROS  negative psych ROS   GI/Hepatic negative GI ROS, Neg liver ROS,GERD  ,,  Endo/Other  negative endocrine ROS    Renal/GU CRFRenal diseasenegative Renal ROS  negative genitourinary   Musculoskeletal   Abdominal   Peds  Hematology negative hematology ROS (+)   Anesthesia Other Findings   Reproductive/Obstetrics negative OB ROS                             Anesthesia Physical Anesthesia Plan  ASA: 3  Anesthesia Plan: General   Post-op Pain Management:    Induction:   PONV Risk Score and Plan: Propofol infusion  Airway Management Planned:   Additional Equipment:   Intra-op Plan:   Post-operative Plan:   Informed Consent: I have reviewed the patients History and Physical, chart, labs and discussed the procedure including the risks, benefits and alternatives for the proposed anesthesia with the patient or authorized representative who has indicated his/her understanding and acceptance.     Dental Advisory Given  Plan Discussed with: CRNA  Anesthesia Plan Comments:        Anesthesia Quick Evaluation

## 2023-04-29 NOTE — Discharge Instructions (Signed)
  Colonoscopy Discharge Instructions  Read the instructions outlined below and refer to this sheet in the next few weeks. These discharge instructions provide you with general information on caring for yourself after you leave the hospital. Your doctor may also give you specific instructions. While your treatment has been planned according to the most current medical practices available, unavoidable complications occasionally occur.   ACTIVITY You may resume your regular activity, but move at a slower pace for the next 24 hours.  Take frequent rest periods for the next 24 hours.  Walking will help get rid of the air and reduce the bloated feeling in your belly (abdomen).  No driving for 24 hours (because of the medicine (anesthesia) used during the test).   Do not sign any important legal documents or operate any machinery for 24 hours (because of the anesthesia used during the test).  NUTRITION Drink plenty of fluids.  You may resume your normal diet as instructed by your doctor.  Begin with a light meal and progress to your normal diet. Heavy or fried foods are harder to digest and may make you feel sick to your stomach (nauseated).  Avoid alcoholic beverages for 24 hours or as instructed.  MEDICATIONS You may resume your normal medications unless your doctor tells you otherwise.  WHAT YOU CAN EXPECT TODAY Some feelings of bloating in the abdomen.  Passage of more gas than usual.  Spotting of blood in your stool or on the toilet paper.  IF YOU HAD POLYPS REMOVED DURING THE COLONOSCOPY: No aspirin products for 7 days or as instructed.  No alcohol for 7 days or as instructed.  Eat a soft diet for the next 24 hours.  FINDING OUT THE RESULTS OF YOUR TEST Not all test results are available during your visit. If your test results are not back during the visit, make an appointment with your caregiver to find out the results. Do not assume everything is normal if you have not heard from your  caregiver or the medical facility. It is important for you to follow up on all of your test results.  SEEK IMMEDIATE MEDICAL ATTENTION IF: You have more than a spotting of blood in your stool.  Your belly is swollen (abdominal distention).  You are nauseated or vomiting.  You have a temperature over 101.  You have abdominal pain or discomfort that is severe or gets worse throughout the day.   Unfortunately, your colon was not adequately prepped today for colonoscopy.    I did remove 5 polyps today, 1 was quite large.  Await pathology results, my office will contact you.   Would recommend repeat colonoscopy in 6 months with a different colon prep.  Follow up in Gi office in 3 months   I hope you have a great rest of your week!  Hennie Duos. Marletta Lor, D.O. Gastroenterology and Hepatology [facility]

## 2023-04-29 NOTE — H&P (Signed)
Primary Care Physician:  Anabel Halon, MD Primary Gastroenterologist:  Dr. Marletta Lor  Pre-Procedure History & Physical: HPI:  Scott Hubbard is a 67 y.o. male is here for a colonoscopy to be performed for posiitve Cologuard testing  Past Medical History:  Diagnosis Date   Chest discomfort    negative stress echocardiogram-01/2011   Chronic bronchitis    CKD (chronic kidney disease)    COPD (chronic obstructive pulmonary disease) (HCC)    DJD (degenerative joint disease)    Gastro-esophageal reflux disease with esophagitis    GERD (gastroesophageal reflux disease)    Hyperlipidemia    Hypertension    Migraine    PVC (premature ventricular contraction)    Frequent   Tobacco abuse    40 pack years    Past Surgical History:  Procedure Laterality Date   COLONOSCOPY WITH PROPOFOL N/A 02/04/2023   Lanelle Bal, DO; inadequate prep    Prior to Admission medications   Medication Sig Start Date End Date Taking? Authorizing Provider  acetaminophen (TYLENOL) 500 MG tablet Take 1,000 mg by mouth every 6 (six) hours as needed.    [provider]  albuterol (PROAIR HFA) 108 (90 Base) MCG/ACT inhaler 2 puffs every 4 hours as needed only  if your can't catch your breath 09/01/22   Nyoka Cowden, MD  cyclobenzaprine (FLEXERIL) 10 MG tablet TAKE 1 TABLET BY MOUTH TWICE A DAY AS NEEDED FOR MUSCLE SPASM 02/14/23   Anabel Halon, MD  famotidine (PEPCID) 20 MG tablet Take 1 tablet (20 mg total) by mouth daily after supper. 11/10/22   Nyoka Cowden, MD  FLOVENT HFA 220 MCG/ACT inhaler TAKE 2 PUFFS BY MOUTH TWICE A DAY 06/23/22   Nyoka Cowden, MD  ondansetron [facility]) 4 MG tablet On the day of your colon prep, take 1 tablet (4 mg total) by mouth every 8 (eight) hours as needed for nausea or vomiting. 03/09/23   Letta Median, PA-C  pantoprazole (PROTONIX) 40 MG tablet TAKE 1 TABLET (40 MG TOTAL) BY MOUTH DAILY. TAKE 30-60 MIN BEFORE FIRST MEAL OF THE DAY 04/07/23   Nyoka Cowden,  MD  predniSONE (DELTASONE) 10 MG tablet Take  4 each am x 2 days,   2 each am x 2 days,  1 each am x 2 days and stop 03/17/23   Nyoka Cowden, MD  rOPINIRole (REQUIP) 1 MG tablet TAKE 1 TABLET BY MOUTH EVERYDAY AT BEDTIME 04/11/23   Anabel Halon, MD  Tiotropium Bromide-Olodaterol (STIOLTO RESPIMAT) 2.5-2.5 MCG/ACT AERS INHALE 2 PUFFS BY MOUTH INTO THE LUNGS DAILY 01/11/22   Nyoka Cowden, MD  Tiotropium Bromide-Olodaterol (STIOLTO RESPIMAT) 2.5-2.5 MCG/ACT AERS Inhale 2 puffs into the lungs daily. 03/01/23   Nyoka Cowden, MD  valsartan-hydrochlorothiazide (DIOVAN-HCT) 80-12.5 MG tablet TAKE 1 TABLET BY MOUTH EVERY DAY 09/28/22   Anabel Halon, MD    Allergies as of 03/29/2023   (No Known Allergies)    Family History  Problem Relation Age of Onset   Colon cancer Neg Hx     Social History   Socioeconomic History   Marital status: Legally Separated    Spouse name: Not on file   Number of children: 2   Years of education: Not on file   Highest education level: Not on file  Occupational History   Occupation: unemployed    Employer: UNEMPLOYED  Tobacco Use   Smoking status: Former    Packs/day: 1.50    Years: 40.00  Additional pack years: 0.00    Total pack years: 60.00    Types: Cigarettes    Quit date: 04/27/2016    Years since quitting: 7.0   Smokeless tobacco: Never  Vaping Use   Vaping Use: Never used  Substance and Sexual Activity   Alcohol use: No   Drug use: No   Sexual activity: Not on file  Other Topics Concern   Not on file  Social History Narrative   Lives with girlfriend Beryle Flock  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     Social Determinants of Health   Financial Resource Strain: Low Risk  (04/12/2023)   Overall Financial Resource Strain (CARDIA)    Difficulty of Paying Living Expenses: Not hard at all  Food Insecurity: No Food Insecurity (04/12/2023)   Hunger Vital Sign    Worried About Running Out of Food in the Last Year: Never true    Ran Out of  Food in the Last Year: Never true  Transportation Needs: No Transportation Needs (04/12/2023)   PRAPARE - Administrator, Civil Service (Medical): No    Lack of Transportation (Non-Medical): No  Physical Activity: Sufficiently Active (09/06/2022)   Exercise Vital Sign    Days of Exercise per Week: 5 days    Minutes of Exercise per Session: 30 min  Stress: No Stress Concern Present (04/12/2023)   [employer] of Occupational Health - Occupational Stress Questionnaire    Feeling of Stress : Only a little  Social Connections: Moderately Integrated (09/06/2022)   Social Connection and Isolation Panel [NHANES]    Frequency of Communication with Friends and Family: More than three times a week    Frequency of Social Gatherings with Friends and Family: Twice a week    Attends Religious Services: 1 to 4 times per year    Active Member of [employer] or Organizations: No    Attends Banker Meetings: Never    Marital Status: Living with partner  Intimate Partner Violence: Not At Risk (09/06/2022)   Humiliation, Afraid, Rape, and Kick questionnaire    Fear of Current or Ex-Partner: No    Emotionally Abused: No    Physically Abused: No    Sexually Abused: No    Review of Systems: See HPI, otherwise negative ROS  Physical Exam: Vital signs in last 24 hours:     General:   Alert,  Well-developed, well-nourished, pleasant and cooperative in NAD Head:  Normocephalic and atraumatic. Eyes:  Sclera clear, no icterus.   Conjunctiva pink. Ears:  Normal auditory acuity. Nose:  No deformity, discharge,  or lesions. Msk:  Symmetrical without gross deformities. Normal posture. Extremities:  Without clubbing or edema. Neurologic:  Alert and  oriented x4;  grossly normal neurologically. Skin:  Intact without significant lesions or rashes. Psych:  Alert and cooperative. Normal mood and affect.  Impression/Plan: Scott Hubbard is here for a colonoscopy to be performed for posiitve  Cologuard testing  The risks of the procedure including infection, bleed, or perforation as well as benefits, limitations, alternatives and imponderables have been reviewed with the patient. Questions have been answered. All parties agreeable.

## 2023-04-30 NOTE — Anesthesia Postprocedure Evaluation (Signed)
Anesthesia Post Note  Patient: Scott Hubbard  Procedure(s) Performed: COLONOSCOPY WITH PROPOFOL POLYPECTOMY  Patient location during evaluation: Phase II Anesthesia Type: General Level of consciousness: awake Pain management: pain level controlled Vital Signs Assessment: post-procedure vital signs reviewed and stable Respiratory status: spontaneous breathing and respiratory function stable Cardiovascular status: blood pressure returned to baseline and stable Postop Assessment: no headache and no apparent nausea or vomiting Anesthetic complications: no Comments: Late entry   No notable events documented.   Last Vitals:  Vitals:   04/29/23 1327 04/29/23 1334  BP: (!) 114/58 (!) 133/59  Pulse: 69   Resp: 16   Temp: (!) 36.4 C   SpO2: 97%     Last Pain:  Vitals:   04/29/23 1327  TempSrc:   PainSc: 0-No pain                 Windell Norfolk

## 2023-05-02 ENCOUNTER — Other Ambulatory Visit (HOSPITAL_COMMUNITY): Payer: Self-pay

## 2023-05-02 MED ORDER — QVAR REDIHALER 80 MCG/ACT IN AERB: INHALATION_SPRAY | RESPIRATORY_TRACT | 11 refills | Status: DC

## 2023-05-02 NOTE — Telephone Encounter (Signed)
Per test claims preferred alternatives are:   Arnuity: $0.00 Qvar Redihaler: $0.00

## 2023-05-03 LAB — SURGICAL PATHOLOGY

## 2023-05-05 ENCOUNTER — Encounter (HOSPITAL_COMMUNITY): Payer: Self-pay | Admitting: Internal Medicine

## 2023-05-15 ENCOUNTER — Other Ambulatory Visit: Payer: Self-pay | Admitting: Internal Medicine

## 2023-05-15 DIAGNOSIS — M62838 Other muscle spasm: Secondary | ICD-10-CM

## 2023-05-19 ENCOUNTER — Telehealth: Payer: Self-pay | Admitting: *Deleted

## 2023-05-19 NOTE — Telephone Encounter (Signed)
Pt called and states he has not had a bowel movement in 20 days. Please advise.

## 2023-05-19 NOTE — Telephone Encounter (Signed)
Has he passed any stool at all? Even a small amount? Is he passing gas? Any abdominal pain, N/V? As long as he is at least passing gas, recommend starting MiraLAX 17 BID in 8 oz of water or other noncarbonated beverage until bowels start moving well, then decrease to once daily.

## 2023-05-19 NOTE — Telephone Encounter (Signed)
Spoke to pt, informed him of recommendations. Pt voiced understanding. Pt states he is having gas and has only had a small amount of stool. He has not had any N/V.

## 2023-05-26 ENCOUNTER — Telehealth: Payer: Self-pay | Admitting: *Deleted

## 2023-05-26 NOTE — Telephone Encounter (Signed)
Returned pt's phone call and left a message for him to call office back.

## 2023-07-21 ENCOUNTER — Other Ambulatory Visit: Payer: Self-pay | Admitting: Internal Medicine

## 2023-07-22 ENCOUNTER — Other Ambulatory Visit: Payer: Self-pay | Admitting: Internal Medicine

## 2023-07-22 NOTE — Telephone Encounter (Signed)
Pharmacy comment: Alternative Requested:PT WAS PRESCRIBED FLOVENT , CANNOT GET BRAND AND GENERIC IS NOT COVERED NEED ALTERNATIVE.

## 2023-07-25 ENCOUNTER — Ambulatory Visit: Payer: 59 | Admitting: Gastroenterology

## 2023-07-25 ENCOUNTER — Encounter: Payer: 59 | Admitting: Internal Medicine

## 2023-07-26 MED ORDER — QVAR REDIHALER 80 MCG/ACT IN AERB: INHALATION_SPRAY | RESPIRATORY_TRACT | 11 refills | Status: DC

## 2023-07-27 ENCOUNTER — Encounter: Payer: Self-pay | Admitting: Internal Medicine

## 2023-08-23 ENCOUNTER — Telehealth: Payer: Self-pay | Admitting: Internal Medicine

## 2023-08-23 NOTE — Telephone Encounter (Signed)
Placard   Copied Noted sleeved

## 2023-08-23 NOTE — Telephone Encounter (Signed)
Pt spoke with on call nurse line wants to know the results of PAD foot exam. Wants call back

## 2023-08-23 NOTE — Telephone Encounter (Signed)
Patient picked up form

## 2023-08-24 NOTE — Telephone Encounter (Signed)
Patient is unaware of anything, states he never called

## 2023-09-06 ENCOUNTER — Other Ambulatory Visit: Payer: Self-pay | Admitting: Internal Medicine

## 2023-09-06 DIAGNOSIS — G2581 Restless legs syndrome: Secondary | ICD-10-CM

## 2023-09-07 ENCOUNTER — Encounter: Payer: 59 | Admitting: Internal Medicine

## 2023-09-07 NOTE — Progress Notes (Signed)
Scott Hubbard, male    DOB: 11/23/1956    MRN: [id number]   Brief patient profile:  84 yowm MM/quit smoking 04/2016 @ onset of severe spells of sob and since then doe gradually worse despite rx with flovent/ stiolto so referred to pulmonary clinic 04/09/2020 by Dr  Judee Clara     History of Present Illness  04/09/2020  Pulmonary/ 1st office eval/Kalie Cabral  Chief Complaint  Patient presents with   Pulmonary Consult    Referred by Dr. Dorette Grate for eval of COPD. Former pt of Dr Juanetta Gosling. He states he gets SOB "when I do anything".    Dyspnea:  [employer] x 2 aisles but walks "faster than other people" (walked very slowly in office) so MMRC3 = can't walk 100 yards even at a slow pace at a flat grade s stopping due to sob    Last time mb and back was prior to prior to when he quit smoking as has long driveway with incline on way back  Cough: none Sleep: flat bed / one pillow L side s respiratory symptoms  SABA use: 2 pffs each am / does not know how when to use hfa vs neb     rec Plan A = Automatic = Always=    Stiolto x 2 pffs/ flovent x 2puffs  first thing in am and repeat flovent in pm  Prednisone 10 mg x 2 daily until breathing better then 1 daily x a week and stop (#60)     Take valsartan 40 mg x 2 pills daily until you see your cardiologist            Plan B = Backup (to supplement plan A, not to replace it) Only use your albuterol inhaler as a rescue medication      06/09/2022  f/u ov/[place] office/Schyler Counsell re: copd gold 4 maint on stiolto worse x several months  Chief Complaint  Patient presents with   Follow-up    Feels breathing has ben worse since last visit. No longer wheezing and not coughing as much   Dyspnea:  now rides scooter at [employer] Cough: none  Sleeping: flat bed/ one pillow  SABA use: hfa last used 3.5 h pta  5-6 x per day  02: none  Lung cancer screening: declined  Rec Plan A = Automatic = Always=    Stiolto 2 pffs 1st thing AM  and chase with flovent 2 puffs/ repeat  the flovent 12 hours later  Work on inhaler technique:   Plan B = Backup (to supplement plan A, not to replace it) Only use your albuterol inhaler as a rescue medication  Plan C = Crisis (instead of Plan B but only if Plan B stops working) - only use your albuterol nebulizer if you first try Plan B  Be sure you are taking pepcid (famotidine) 20 mg after bfast and after supper  Prednisone 10 mg take  4 each am x 2 days,   2 each am x 2 days,  1 each am x 2 days and stop    Please schedule a follow up office visit in 6 weeks, call sooner if needed with all medications /inhalers/ solutions in hand Add refer to rehab if willing     03/17/2023 6 m  f/u ov/[place] office/Edla Para re: GOLD 4 maint on stiolto / flovent  no recent pred Chief Complaint  Patient presents with   Follow-up    Breathing better since last ov   Dyspnea:  no longer  riding scooter as much / walking at [employer] whole store slow pace  Cough: better  Sleeping: flat bed/ one pillow  SABA use: hardly ever  02:none Rec My office will be contacting you by phone for referral to lung cancer screening  > not done as of 09/08/2023  Plan A = Automatic = Always=    stiolto/ flovent as you are  Plan B = Backup (to supplement plan A, not to replace it) Only use your albuterol inhaler as a rescue medication  Plan C = Crisis (instead of Plan B but only if Plan B stops working) - only use your albuterol nebulizer if you first try Plan B  Plan D = Deltasone = Prednisone  If ABC and not helping > Prednisone 10 mg take  4 each am x 2 days,   2 each am x 2 days,  1 each am x 2 days and stop     09/08/2023  f/u ov/[place] office/Pavielle Biggar re: GOLD 4 copd  maint on stiolto / pred prn just started  round / does not have ICS as rec   Dyspnea:  whole [employer] slow pace Cough: minimal  Sleeping: flat bed/ one pillow on side s  resp cc  SABA use: once day  02: none   Lung cancer screening: not done as of 09/08/2023 > referred again    No  obvious day to day or daytime variability or assoc excess/ purulent sputum or mucus plugs or hemoptysis or cp or chest tightness, subjective wheeze or overt sinus or hb symptoms.    Also denies any obvious fluctuation of symptoms with weather or environmental changes or other aggravating or alleviating factors except as outlined above   No unusual exposure hx or h/o childhood pna/ asthma or knowledge of premature birth.  Current Allergies, Complete Past Medical History, Past Surgical History, Family History, and Social History were reviewed in [name] record.  ROS  The following are not active complaints unless bolded Hoarseness, sore throat, dysphagia, dental problems, itching, sneezing,  nasal congestion or discharge of excess mucus or purulent secretions, ear ache,   fever, chills, sweats, unintended wt loss or wt gain, classically pleuritic or exertional cp,  orthopnea pnd or arm/hand swelling  or leg swelling, presyncope, palpitations, abdominal pain, anorexia, nausea, vomiting, diarrhea  or change in bowel habits or change in bladder habits, change in stools or change in urine, dysuria, hematuria,  rash, arthralgias, visual complaints, headache, numbness, weakness or ataxia or problems with walking or coordination,  change in mood or  memory.        Current Meds  Medication Sig   acetaminophen (TYLENOL) 500 MG tablet Take 1,000 mg by mouth every 6 (six) hours as needed.   albuterol (PROAIR HFA) 108 (90 Base) MCG/ACT inhaler 2 puffs every 4 hours as needed only  if your can't catch your breath   beclomethasone (QVAR REDIHALER) 80 MCG/ACT inhaler Take 2 puffs first thing in am and then another 2 puffs about 12 hours later.   cyclobenzaprine (FLEXERIL) 10 MG tablet TAKE 1 TABLET BY MOUTH TWICE A DAY AS NEEDED FOR MUSCLE SPASMS   famotidine (PEPCID) 20 MG tablet Take 1 tablet (20 mg total) by mouth daily after supper.   ondansetron (ZOFRAN) 4 MG tablet On the day of  your colon prep, take 1 tablet (4 mg total) by mouth every 8 (eight) hours as needed for nausea or vomiting.   pantoprazole (PROTONIX) 40 MG tablet TAKE 1 TABLET (40  MG TOTAL) BY MOUTH DAILY. TAKE 30-60 MIN BEFORE FIRST MEAL OF THE DAY   predniSONE (DELTASONE) 10 MG tablet Take  4 each am x 2 days,   2 each am x 2 days,  1 each am x 2 days and stop   rOPINIRole (REQUIP) 1 MG tablet TAKE 1 TABLET BY MOUTH EVERYDAY AT BEDTIME   Tiotropium Bromide-Olodaterol (STIOLTO RESPIMAT) 2.5-2.5 MCG/ACT AERS Inhale 2 puffs into the lungs daily.   valsartan-hydrochlorothiazide (DIOVAN-HCT) 80-12.5 MG tablet TAKE 1 TABLET BY MOUTH EVERY DAY                                                  Past Medical History:  Diagnosis Date   Chest discomfort    negative stress echocardiogram-01/2011   Chronic bronchitis    COPD (chronic obstructive pulmonary disease) (HCC)    DJD (degenerative joint disease)    Dyspnea    Gastro-esophageal reflux disease with esophagitis    GERD (gastroesophageal reflux disease)    Hyperlipidemia    Hypertension    Migraine    PVC (premature ventricular contraction)    Frequent   Tobacco abuse    Tobacco abuse    40 pack years       Objective:   Wts  09/08/2023        172  03/17/2023        173  09/29/2022      176 09/01/2022        172  06/09/2022        176 12/16/2021      183  05/05/2021        192  01/28/2021         203 11/06/2020      200  09/08/2020       195   05/09/20 197 lb (89.4 kg)  04/30/20 197 lb (89.4 kg)  04/09/20 197 lb (89.4 kg)      Vital signs reviewed  09/08/2023  - Note at rest 02 sats  90% on RA   General appearance:    amb thin somber wm nad    HEENT :  Oropharynx  clear  Nasal turbinates nl    NECK :  without JVD/Nodes/TM/ nl carotid upstrokes bilaterally   LUNGS: no acc muscle use,  Mod barrel  contour chest wall with bilateral  Distant bs s audible wheeze and  without cough on insp or exp maneuvers and mod  Hyperresonant  to   percussion bilaterally     CV:  RRR  no s3 or murmur or increase in P2, and no edema   ABD:  soft and nontender    MS:   Ext warm without deformities or   obvious joint restrictions , calf tenderness, cyanosis or clubbing  SKIN: warm and dry without lesions    NEURO:  alert, approp, nl sensorium with  no motor or cerebellar deficits apparent.              Assessment

## 2023-09-08 ENCOUNTER — Encounter: Payer: Self-pay | Admitting: Internal Medicine

## 2023-09-08 ENCOUNTER — Ambulatory Visit (INDEPENDENT_AMBULATORY_CARE_PROVIDER_SITE_OTHER): Payer: 59 | Admitting: Internal Medicine

## 2023-09-08 VITALS — BP 136/76 | HR 71 | Ht 77.0 in | Wt 172.0 lb

## 2023-09-08 DIAGNOSIS — K219 Gastro-esophageal reflux disease without esophagitis: Secondary | ICD-10-CM

## 2023-09-08 DIAGNOSIS — J42 Unspecified chronic bronchitis: Secondary | ICD-10-CM

## 2023-09-08 DIAGNOSIS — Z0001 Encounter for general adult medical examination with abnormal findings: Secondary | ICD-10-CM

## 2023-09-08 DIAGNOSIS — R739 Hyperglycemia, unspecified: Secondary | ICD-10-CM | POA: Diagnosis not present

## 2023-09-08 DIAGNOSIS — Z125 Encounter for screening for malignant neoplasm of prostate: Secondary | ICD-10-CM

## 2023-09-08 DIAGNOSIS — M62838 Other muscle spasm: Secondary | ICD-10-CM | POA: Diagnosis not present

## 2023-09-08 DIAGNOSIS — G2581 Restless legs syndrome: Secondary | ICD-10-CM | POA: Diagnosis not present

## 2023-09-08 DIAGNOSIS — N1832 Chronic kidney disease, stage 3b: Secondary | ICD-10-CM

## 2023-09-08 DIAGNOSIS — I10 Essential (primary) hypertension: Secondary | ICD-10-CM

## 2023-09-08 DIAGNOSIS — Z2821 Immunization not carried out because of patient refusal: Secondary | ICD-10-CM | POA: Diagnosis not present

## 2023-09-08 DIAGNOSIS — Z87891 Personal history of nicotine dependence: Secondary | ICD-10-CM | POA: Diagnosis not present

## 2023-09-08 DIAGNOSIS — E782 Mixed hyperlipidemia: Secondary | ICD-10-CM

## 2023-09-08 DIAGNOSIS — J449 Chronic obstructive pulmonary disease, unspecified: Secondary | ICD-10-CM

## 2023-09-08 MED ORDER — QVAR REDIHALER 80 MCG/ACT IN AERB: INHALATION_SPRAY | RESPIRATORY_TRACT | 11 refills | Status: DC

## 2023-09-08 MED ORDER — CYCLOBENZAPRINE HCL 10 MG PO TABS: ORAL_TABLET | ORAL | 3 refills | Status: DC

## 2023-09-08 NOTE — Patient Instructions (Signed)
Please continue to take medications as prescribed.  Please continue to follow low salt diet and ambulate as tolerated.  Please increase water intake and cut down soft drink intake.  Please consider getting Shingrix and Tdap vaccine at local pharmacy.

## 2023-09-08 NOTE — Assessment & Plan Note (Signed)
Physical exam as documented. Fasting blood tests today. Refused Pneumococcal vaccine.

## 2023-09-08 NOTE — Progress Notes (Signed)
Established Patient Office Visit  Subjective:  Patient ID: Scott Hubbard, male    DOB: November 16, 1956  Age: 67 y.o. MRN: [id number]  CC:  Chief Complaint  Patient presents with   Annual Exam    HPI Scott Hubbard is a 67 y.o. male with past medical history of COPD, hypertension, CKD and previous tobacco abuse who presents for annual physical.  He complains of chronic leg cramps and forearm cramps, for which he is taking Flexeril now.  He has seen improvement in his leg cramps, but still has mild cramps in his UE.  He has tried ropinirole, but did not have adequate response.   HTN: BP is well-controlled. Takes medications regularly. Patient denies headache, dizziness, or palpitations.   COPD: He complains of chronic cough and dyspnea, for which he uses Stiolto and as needed albuterol neb and inhaler.  Followed by Dr. Sherene Sires.  He has oral steroids for acute exacerbations.  CKD: His S. Cr. and GFR had been improving in the last BMP.  He currently denies any dysuria or hematuria.  He reports that he has stopped drinking water and instead has been taking diet pepsi.  Past Medical History:  Diagnosis Date   Chest discomfort    negative stress echocardiogram-01/2011   Chronic bronchitis    CKD (chronic kidney disease)    COPD (chronic obstructive pulmonary disease) (HCC)    DJD (degenerative joint disease)    Gastro-esophageal reflux disease with esophagitis    GERD (gastroesophageal reflux disease)    Hyperlipidemia    Hypertension    Migraine    PVC (premature ventricular contraction)    Frequent   Tobacco abuse    40 pack years    Past Surgical History:  Procedure Laterality Date   COLONOSCOPY WITH PROPOFOL N/A 02/04/2023   Lanelle Bal, DO; inadequate prep   COLONOSCOPY WITH PROPOFOL N/A 04/29/2023   Procedure: COLONOSCOPY WITH PROPOFOL;  Surgeon: Lanelle Bal, DO;  Location: [facility];  Service: Endoscopy;  Laterality: N/A;  1:15 pm, ASA 3   POLYPECTOMY  04/29/2023    Procedure: POLYPECTOMY;  Surgeon: Lanelle Bal, DO;  Location: [facility];  Service: Endoscopy;;    Family History  Problem Relation Age of Onset   Colon cancer Neg Hx     Social History   Socioeconomic History   Marital status: Legally Separated    Spouse name: Not on file   Number of children: 2   Years of education: Not on file   Highest education level: Not on file  Occupational History   Occupation: unemployed    Employer: UNEMPLOYED  Tobacco Use   Smoking status: Former    Current packs/day: 0.00    Average packs/day: 1.5 packs/day for 40.0 years (60.0 ttl pk-yrs)    Types: Cigarettes    Start date: 04/27/1976    Quit date: 04/27/2016    Years since quitting: 7.3   Smokeless tobacco: Never  Vaping Use   Vaping status: Never Used  Substance and Sexual Activity   Alcohol use: No   Drug use: No   Sexual activity: Not on file  Other Topics Concern   Not on file  Social History Narrative   Lives with girlfriend Beryle Flock  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     Social Determinants of Health   Financial Resource Strain: Low Risk  (04/12/2023)   Overall Financial Resource Strain (CARDIA)    Difficulty of Paying Living Expenses: Not hard at all  Food Insecurity: No Food Insecurity (04/12/2023)   Hunger Vital Sign    Worried About Running Out of Food in the Last Year: Never true    Ran Out of Food in the Last Year: Never true  Transportation Needs: No Transportation Needs (04/12/2023)   PRAPARE - Administrator, Civil Service (Medical): No    Lack of Transportation (Non-Medical): No  Physical Activity: Sufficiently Active (09/06/2022)   Exercise Vital Sign    Days of Exercise per Week: 5 days    Minutes of Exercise per Session: 30 min  Stress: No Stress Concern Present (04/12/2023)   [employer] of Occupational Health - Occupational Stress Questionnaire    Feeling of Stress : Only a little  Social Connections: Moderately Integrated (09/06/2022)    Social Connection and Isolation Panel [NHANES]    Frequency of Communication with Friends and Family: More than three times a week    Frequency of Social Gatherings with Friends and Family: Twice a week    Attends Religious Services: 1 to 4 times per year    Active Member of [employer] or Organizations: No    Attends Banker Meetings: Never    Marital Status: Living with partner  Intimate Partner Violence: Not At Risk (09/06/2022)   Humiliation, Afraid, Rape, and Kick questionnaire    Fear of Current or Ex-Partner: No    Emotionally Abused: No    Physically Abused: No    Sexually Abused: No    Outpatient Medications Prior to Visit  Medication Sig Dispense Refill   acetaminophen (TYLENOL) 500 MG tablet Take 1,000 mg by mouth every 6 (six) hours as needed.     albuterol (PROAIR HFA) 108 (90 Base) MCG/ACT inhaler 2 puffs every 4 hours as needed only  if your can't catch your breath 18 g 11   beclomethasone (QVAR REDIHALER) 80 MCG/ACT inhaler Take 2 puffs first thing in am and then another 2 puffs about 12 hours later. 10.6 g 11   famotidine (PEPCID) 20 MG tablet Take 1 tablet (20 mg total) by mouth daily after supper. 90 tablet 3   ondansetron (ZOFRAN) 4 MG tablet On the day of your colon prep, take 1 tablet (4 mg total) by mouth every 8 (eight) hours as needed for nausea or vomiting. 5 tablet 0   pantoprazole (PROTONIX) 40 MG tablet TAKE 1 TABLET (40 MG TOTAL) BY MOUTH DAILY. TAKE 30-60 MIN BEFORE FIRST MEAL OF THE DAY 90 tablet 3   predniSONE (DELTASONE) 10 MG tablet Take  4 each am x 2 days,   2 each am x 2 days,  1 each am x 2 days and stop 14 tablet 11   rOPINIRole (REQUIP) 1 MG tablet TAKE 1 TABLET BY MOUTH EVERYDAY AT BEDTIME 90 tablet 1   Tiotropium Bromide-Olodaterol (STIOLTO RESPIMAT) 2.5-2.5 MCG/ACT AERS Inhale 2 puffs into the lungs daily. 4 g 11   valsartan-hydrochlorothiazide (DIOVAN-HCT) 80-12.5 MG tablet TAKE 1 TABLET BY MOUTH EVERY DAY 90 tablet 3   cyclobenzaprine  (FLEXERIL) 10 MG tablet TAKE 1 TABLET BY MOUTH TWICE A DAY AS NEEDED FOR MUSCLE SPASMS 60 tablet 2   No facility-administered medications prior to visit.    No Known Allergies  ROS Review of Systems  Constitutional:  Negative for chills and fever.  HENT:  Negative for sinus pressure, sinus pain and sore throat.   Eyes:  Negative for pain and discharge.  Respiratory:  Positive for cough, shortness of breath (chronic) and wheezing.  Cardiovascular:  Negative for chest pain and palpitations.  Gastrointestinal:  Negative for constipation, diarrhea, nausea and vomiting.  Endocrine: Negative for polydipsia and polyuria.  Genitourinary:  Negative for dysuria and hematuria.  Musculoskeletal:  Negative for neck pain and neck stiffness.  Skin:  Positive for rash (b/l UE).  Neurological:  Negative for dizziness, weakness, numbness and headaches.  Psychiatric/Behavioral:  Negative for agitation and behavioral problems.       Objective:    Physical Exam Vitals reviewed.  Constitutional:      General: He is not in acute distress.    Appearance: He is not diaphoretic.  HENT:     Head: Normocephalic and atraumatic.     Mouth/Throat:     Mouth: Mucous membranes are moist.  Eyes:     General: No scleral icterus.    Extraocular Movements: Extraocular movements intact.  Cardiovascular:     Rate and Rhythm: Normal rate and regular rhythm.     Pulses: Normal pulses.     Heart sounds: Normal heart sounds. No murmur heard.    No friction rub. No gallop.  Pulmonary:     Effort: Pulmonary effort is normal. No respiratory distress.     Breath sounds: Wheezing (B/l, diffuse (mild)) present. No rales.  Abdominal:     General: Bowel sounds are normal.     Palpations: Abdomen is soft.     Tenderness: There is no abdominal tenderness.     Hernia: A hernia (Umbilical) is present.  Musculoskeletal:        General: No swelling or signs of injury.     Cervical back: Neck supple. No tenderness.      Right lower leg: No edema.     Left lower leg: No edema.     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     Findings: Rash (Erythematous plaques over b/l UE) present.     Comments: Herpetic wart over left palm  Neurological:     General: No focal deficit present.     Mental Status: He is alert and oriented to person, place, and time.     Sensory: No sensory deficit.     Motor: No weakness.  Psychiatric:        Mood and Affect: Mood normal.        Behavior: Behavior normal.     BP 136/76 (BP Location: Left Arm)   Pulse 71   Ht 6\' 5"  (1.956 m)   Wt 172 lb (78 kg)   SpO2 90%   BMI 20.40 kg/m  Wt Readings from Last 3 Encounters:  09/08/23 172 lb (78 kg)  04/29/23 179 lb (81.2 kg)  04/27/23 180 lb (81.6 kg)    Lab Results  Component Value Date   TSH 0.693 06/09/2022   Lab Results  Component Value Date   WBC 11.7 (H) 06/09/2022   HGB 13.2 06/09/2022   HCT 40.0 06/09/2022   MCV 88 06/09/2022   PLT 190 06/09/2022   Lab Results  Component Value Date   NA 137 02/02/2023   K 4.4 02/02/2023   CO2 27 02/02/2023   GLUCOSE 95 02/02/2023   BUN 25 (H) 02/02/2023   CREATININE 1.88 (H) 02/02/2023   BILITOT 0.5 06/09/2022   ALKPHOS 71 06/09/2022   AST 16 06/09/2022   ALT 11 06/09/2022   PROT 6.7 06/09/2022   ALBUMIN 4.4 06/09/2022   CALCIUM 8.8 (L) 02/02/2023  ANIONGAP 8 02/02/2023   EGFR 42 (L) 10/25/2022   Lab Results  Component Value Date   CHOL 154 06/09/2022   Lab Results  Component Value Date   HDL 35 (L) 06/09/2022   Lab Results  Component Value Date   LDLCALC 103 (H) 06/09/2022   Lab Results  Component Value Date   TRIG 84 06/09/2022   Lab Results  Component Value Date   CHOLHDL 4.4 06/09/2022   Lab Results  Component Value Date   HGBA1C 5.7 (H) 06/09/2022      Assessment & Plan:   Problem List Items Addressed This Visit       Cardiovascular and Mediastinum   Essential  hypertension    BP Readings from Last 1 Encounters:  09/08/23 136/76   Well-controlled with Valsartan-HCTZ Counseled for compliance with the medications Advised DASH diet and moderate exercise/walking as tolerated      Relevant Orders   TSH   CMP14+EGFR   CBC with Differential/Platelet     Respiratory   Chronic bronchitis (HCC)    Quit smoking in 2017 On Stiolto Albuterol nebs and inhaler PRN Follows up with Dr Sherene Sires  Needs low-dose CT chest for lung cancer screening, but he wants to think about it. Also denies flu and pneumococcal vaccine.        Digestive   GERD (gastroesophageal reflux disease)    On Pantoprazole 40 mg QD Partly contributed by chronic steroid therapy        Genitourinary   Stage 3b chronic kidney disease (HCC)    AKI on CKD in the past, needs to increase water intake and cut down soft drinks On ARB and HCTZ Avoid nephrotoxic agents Reviewed US renal from ER Checked BMP from chart, stable GFR - recheck CMP, PTH and urine protein/creatinine ratio      Relevant Orders   VITAMIN D 25 Hydroxy (Vit-D Deficiency, Fractures)   CMP14+EGFR   CBC with Differential/Platelet   Protein / creatinine ratio, urine   Parathyroid hormone, intact (no Ca)     Other   Restless legs syndrome    Better with Flexeril PRN for leg cramps Was uncontrolled with Requip 1 mg at bedtime alone      Relevant Medications   cyclobenzaprine (FLEXERIL) 10 MG tablet   Other Relevant Orders   TSH   Encounter for general adult medical examination with abnormal findings - Primary    Physical exam as documented. Fasting blood tests today. Refused Pneumococcal vaccine.      Prostate cancer screening    Ordered PSA after discussing its limitations for prostate cancer screening, including false positive results leading to additional investigations.      Relevant Orders   PSA   Other Visit Diagnoses     Muscle spasms of both lower extremities       Relevant Medications    cyclobenzaprine (FLEXERIL) 10 MG tablet   Mixed hyperlipidemia       Relevant Orders   Lipid panel   Hyperglycemia       Relevant Orders   Hemoglobin A1c   Refused influenza vaccine           Meds ordered this encounter  Medications   cyclobenzaprine (FLEXERIL) 10 MG tablet    Sig: TAKE 1 TABLET BY MOUTH TWICE A DAY AS NEEDED FOR MUSCLE SPASMS    Dispense:  60 tablet    Refill:  3    Follow-up: Return in about 4 months (around 01/08/2024).    Sherlyne Crownover K  Allena Katz, MD

## 2023-09-08 NOTE — Assessment & Plan Note (Addendum)
AKI on CKD in the past, needs to increase water intake and cut down soft drinks On ARB and HCTZ Avoid nephrotoxic agents Reviewed US renal from ER Checked BMP from chart, stable GFR - recheck CMP, PTH and urine protein/creatinine ratio

## 2023-09-08 NOTE — Assessment & Plan Note (Signed)
Quit smoking in 2017 On Stiolto Albuterol nebs and inhaler PRN Follows up with Dr Sherene Sires  Needs low-dose CT chest for lung cancer screening, but he wants to think about it. Also denies flu and pneumococcal vaccine.

## 2023-09-08 NOTE — Assessment & Plan Note (Signed)
On Pantoprazole 40 mg QD Partly contributed by chronic steroid therapy

## 2023-09-08 NOTE — Assessment & Plan Note (Addendum)
Ordered PSA after discussing its limitations for prostate cancer screening, including false positive results leading to additional investigations.

## 2023-09-08 NOTE — Patient Instructions (Addendum)
My office will be contacting you by phone for referral to lung cancer screening  at [phone] - if you don't hear back from my office within one week please call us back or notify us thru MyChart and we'll address it right away.   Plan A = Automatic = Always=    stiolto/ qvar 80 2 each am and 2 in evening   Plan B = Backup (to supplement plan A, not to replace it) Only use your albuterol inhaler as a rescue medication to be used if you can't catch your breath by resting or doing a relaxed purse lip breathing pattern.  - The less you use it, the better it will work when you need it. - Ok to use the inhaler up to 2 puffs  every 4 hours if you must but call for appointment if use goes up over your usual need - Don't leave home without it !!  (think of it like starter fluid)  Plan C = Crisis (instead of Plan B but only if Plan B stops working) - only use your albuterol nebulizer if you first try Plan B and it fails to help > ok to use the nebulizer up to every 4 hours but if start needing it regularly call for immediate appointment   Plan D = Deltasone = Prednisone  If ABC and not helping > Prednisone 10 mg take  4 each am x 2 days,   2 each am x 2 days,  1 each am x 2 days and stop   Please schedule a follow up office visit in 4 weeks, sooner if needed  with all medications /inhalers/ solutions in hand so we can verify exactly what you are taking. This includes all medications from all doctors and over the counters

## 2023-09-08 NOTE — Assessment & Plan Note (Signed)
Better with Flexeril PRN for leg cramps Was uncontrolled with Requip 1 mg at bedtime alone

## 2023-09-08 NOTE — Assessment & Plan Note (Signed)
BP Readings from Last 1 Encounters:  09/08/23 136/76   Well-controlled with Valsartan-HCTZ Counseled for compliance with the medications Advised DASH diet and moderate exercise/walking as tolerated

## 2023-09-09 NOTE — Assessment & Plan Note (Addendum)
Quit smoking 2017  - Spirometry  01/04/17  FEV1 0.99 (23%)  Ratio 0.28 p ? Prior  = Allergy profile 04/09/20  >  Eos 0. 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 04/09/2020  MM   Level 156  - 04/09/2020 rec pred 20 until better then 10 mg daily x one week wean - 05/09/2020  After extensive coaching inhaler device,  effectiveness =    90% > rec  continue flovent 220/ stiolto and completely  taper off prednisone  - PFT's  06/24/20  FEV1 1.15 (26 % ) ratio 0.27  p 35 % improvement from saba p stiolto prior to study with DLCO  11.45 (35%) corrects to 1.88 (46%)  for alv volume and FV curve classic curvature  - 09/08/2020  try breztri  - 10/09/2020  After extensive coaching inhaler device,  effectiveness =    90% with smi > change back to stiolto and pred ceiling 20/floor 10 and added protonix 40 mg q am ac as pseudowheeze on exam - 11/06/2020 added pm doses of pepcid/ gerd recs including bed blocks   - .11/06/2020 pred floor of 10 mg / ceiling of 20 mg -  11/06/2020   Walked RA  approx   250 ft  @ avg pace  stopped due to  Sob/ fatigue with sats still 96%  Typical of a pink puffer  -  12/15/2020  After extensive coaching inhaler device,  effectiveness =    90%  -  01/28/2021   Walked RA  approx   400 ft  @ slow to moderate pace  stopped due to  Sob  With sats 94% - 05/05/2021 resumed prednisone  D  2 a day until better then 1 a day x 5 days and stop. - 06/09/2022  continue stiolto  Plus flovent 220 2bid  - 09/01/2022  After extensive coaching inhaler device,  effectiveness =    90% with smi, 50% with hfa  - 09/01/2022 added back Prednisone as plan D = 6 day taper  - 09/01/2022   Walked on RA  x  1  lap(s) =  approx 150  ft  @ moderate pace, stopped due to sob with lowest 02 sats 95%   - 09/29/2022 refused walk  - 09/08/2023  After extensive coaching inhaler device,  effectiveness =    75% smi/ doe not have ICS rx  called in as qvar in June 2024 - 09/08/2023   Walked on RA  x  2  lap(s) =  approx 300  ft  @ slow but  steady pace, stopped due to tired > sob  with lowest 02 sats 90% > declined rehab (again)     Group D (now reclassified as E) in terms of symptom/risk and laba/lama/ICS  therefore appropriate rx at this point >>>  needs lama/laba and ICS  = stiolto/qvar 80 and approp saba with pred as plan D on action plan see avs for instructions unique to this ov   F/u in 4 weeks with all meds in hand using a trust but verify approach to confirm accurate Medication  Reconciliation The principal here is that until we are certain that the  patients are doing what we've asked, it makes no sense to ask them to do more.

## 2023-09-09 NOTE — Assessment & Plan Note (Signed)
Quit in 2017 > referred for lung cancer screening 09/29/2022 > again 03/17/2023 > and again 09/08/2023   Tells me today he has new phone so added this to the referral  Discussed in detail all the  indications, usual  risks and alternatives  relative to the benefits with patient who agrees to proceed with w/u as outlined.     Each maintenance medication was reviewed in detail including emphasizing most importantly the difference between maintenance and prns and under what circumstances the prns are to be triggered using an action plan format where appropriate.  Total time for H and P, chart review, counseling, reviewing hfa/smi device(s) , directly observing portions of ambulatory 02 saturation study/ and generating customized AVS unique to this office visit / same day charting = 30 min

## 2023-09-19 ENCOUNTER — Other Ambulatory Visit: Payer: Self-pay | Admitting: Internal Medicine

## 2023-09-24 ENCOUNTER — Other Ambulatory Visit: Payer: Self-pay | Admitting: Internal Medicine

## 2023-10-06 ENCOUNTER — Ambulatory Visit: Payer: 59 | Admitting: Internal Medicine

## 2023-10-06 ENCOUNTER — Encounter: Payer: Self-pay | Admitting: Internal Medicine

## 2023-10-06 VITALS — BP 126/84 | HR 87 | Ht 77.0 in | Wt 175.0 lb

## 2023-10-06 DIAGNOSIS — J449 Chronic obstructive pulmonary disease, unspecified: Secondary | ICD-10-CM

## 2023-10-06 DIAGNOSIS — Z87891 Personal history of nicotine dependence: Secondary | ICD-10-CM

## 2023-10-06 MED ORDER — QVAR REDIHALER 80 MCG/ACT IN AERB: INHALATION_SPRAY | RESPIRATORY_TRACT | Status: DC

## 2023-10-06 MED ORDER — ALBUTEROL SULFATE (2.5 MG/3ML) 0.083% IN NEBU: 2.5000 mg | INHALATION_SOLUTION | RESPIRATORY_TRACT | 12 refills | Status: AC | PRN

## 2023-10-06 MED ORDER — PREDNISONE 10 MG PO TABS: ORAL_TABLET | ORAL | Status: DC

## 2023-10-06 NOTE — Assessment & Plan Note (Signed)
Quit smoking 2017  - Spirometry  01/04/17  FEV1 0.99 (23%)  Ratio 0.28 p ? Prior  = Allergy profile 04/09/20  >  Eos 0. 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 04/09/2020  MM   Level 156  - 04/09/2020 rec pred 20 until better then 10 mg daily x one week wean - 05/09/2020  After extensive coaching inhaler device,  effectiveness =    90% > rec  continue flovent 220/ stiolto and completely  taper off prednisone  - PFT's  06/24/20  FEV1 1.15 (26 % ) ratio 0.27  p 35 % improvement from saba p stiolto prior to study with DLCO  11.45 (35%) corrects to 1.88 (46%)  for alv volume and FV curve classic curvature  - 09/08/2020  try breztri  - 10/09/2020  After extensive coaching inhaler device,  effectiveness =    90% with smi > change back to stiolto and pred ceiling 20/floor 10 and added protonix 40 mg q am ac as pseudowheeze on exam - 11/06/2020 added pm doses of pepcid/ gerd recs including bed blocks   - .11/06/2020 pred floor of 10 mg / ceiling of 20 mg -  11/06/2020   Walked RA  approx   250 ft  @ avg pace  stopped due to  Sob/ fatigue with sats still 96%  Typical of a pink puffer  -  12/15/2020  After extensive coaching inhaler device,  effectiveness =    90%  -  01/28/2021   Walked RA  approx   400 ft  @ slow to moderate pace  stopped due to  Sob  With sats 94% - 05/05/2021 resumed prednisone  D  2 a day until better then 1 a day x 5 days and stop. - 06/09/2022  continue stiolto  Plus flovent 220 2bid  - 09/01/2022  After extensive coaching inhaler device,  effectiveness =    90% with smi, 50% with hfa  - 09/01/2022 added back Prednisone as plan D = 6 day taper  - 09/01/2022   Walked on RA  x  1  lap(s) =  approx 150  ft  @ moderate pace, stopped due to sob with lowest 02 sats 95%   - 09/29/2022 refused walk  - 09/08/2023  After extensive coaching inhaler device,  effectiveness =    75% smi/ doe not have ICS rx  called in as qvar in June 2024 - 09/08/2023   Walked on RA  x  2  lap(s) =  approx 300  ft  @ slow but  steady pace, stopped due to tired > sob  with lowest 02 sats 90% > declined rehab (again)   - 10/06/2023  After extensive coaching inhaler device,  effectiveness = 75% with dpi/ hfa (short Ti)    Group D (now reclassified as E) in terms of symptom/risk and laba/lama/ICS  therefore appropriate rx at this point >>>  continue stiolto and qvar and more approp saba:  Re SABA :  I spent extra time with pt today reviewing appropriate use of albuterol for prn use on exertion with the following points: 1) saba is for relief of sob that does not improve by walking a slower pace or resting but rather if the pt does not improve after trying this first. 2) If the pt is convinced, as many are, that saba helps recover from activity faster then it's easy to tell if this is the case by re-challenging : ie stop, take  the inhaler, then p 5 minutes try the exact same activity (intensity of workload) that just caused the symptoms and see if they are substantially diminished or not after saba 3) if there is an activity that reproducibly causes the symptoms, try the saba 15 min before the activity on alternate days   If in fact the saba really does help, then fine to continue to use it prn but advised may need to look closer at the maintenance regimen being used to achieve better control of airways disease with exertion.          Each maintenance medication was reviewed in detail including emphasizing most importantly the difference between maintenance and prns and under what circumstances the prns are to be triggered using an action plan format where appropriate.  Total time for H and P, chart review, counseling, reviewing hfa/smi/neb device(s) and generating customized AVS unique to this office visit / same day charting  > 30 min ov

## 2023-10-06 NOTE — Assessment & Plan Note (Signed)
Quit in 2017 > referred for lung cancer screening 09/29/2022 > again 03/17/2023 > and again 09/08/2023 > 10/06/2023 says problems with phone

## 2023-10-06 NOTE — Patient Instructions (Addendum)
My office will be contacting you by phone for referral to lung cancer screening  at [phone] - if you don't hear back from my office within one week please call us back or notify us thru MyChart and we'll address it right away.   Plan A = Automatic = Always=    stiolto just in AM / qvar 80 2 each am and 2 in evening     Plan B = Backup (to supplement plan A, not to replace it) Only use your albuterol inhaler as a rescue medication to be used if you can't catch your breath by resting or doing a relaxed purse lip breathing pattern.  - The less you use it, the better it will work when you need it. - Ok to use the inhaler up to 2 puffs  every 4 hours if you must but call for appointment if use goes up over your usual need - Don't leave home without it !!  (think of it like starter fluid)  Plan C = Crisis (instead of Plan B but only if Plan B stops working) - only use your albuterol nebulizer if you first try Plan B and it fails to help > ok to use the nebulizer up to every 4 hours but if start needing it regularly call for immediate appointment  Also  Ok to try albuterol 15 min (either the inhaler or the nebulizer)  before an activity (on alternating days)  that you know would usually make you short of breath and see if it makes any difference and if makes none then don't take albuterol after activity unless you can't catch your breath as this means it's the resting that helps, not the albuterol.      Plan D = Deltasone = Prednisone  If ABC and not helping > Prednisone 10 mg take  4 each am x 2 days,   2 each am x 2 days,  1 each am x 2 days and stop   Please schedule a follow up visit in 3 months but call sooner if needed - bring respiratory meds

## 2023-10-06 NOTE — Progress Notes (Signed)
Scott Hubbard, male    DOB: 1956/02/27    MRN: [id number]   Brief patient profile:  32 yowm MM/quit smoking 04/2016 @ onset of severe spells of sob and since then doe gradually worse despite rx with flovent/ stiolto so referred to pulmonary clinic 04/09/2020 by Dr  Judee Clara     History of Present Illness  04/09/2020  Pulmonary/ 1st office eval/Scott Hubbard  Chief Complaint  Patient presents with   Pulmonary Consult    Referred by Dr. Dorette Grate for eval of COPD. Former pt of Dr Juanetta Gosling. He states he gets SOB "when I do anything".    Dyspnea:  [employer] x 2 aisles but walks "faster than other people" (walked very slowly in office) so MMRC3 = can't walk 100 yards even at a slow pace at a flat grade s stopping due to sob    Last time mb and back was prior to prior to when he quit smoking as has long driveway with incline on way back  Cough: none Sleep: flat bed / one pillow L side s respiratory symptoms  SABA use: 2 pffs each am / does not know how when to use hfa vs neb     rec Plan A = Automatic = Always=    Stiolto x 2 pffs/ flovent x 2puffs  first thing in am and repeat flovent in pm  Prednisone 10 mg x 2 daily until breathing better then 1 daily x a week and stop (#60)     Take valsartan 40 mg x 2 pills daily until you see your cardiologist            Plan B = Backup (to supplement plan A, not to replace it) Only use your albuterol inhaler as a rescue medication   09/08/2023  f/u ov/[place] office/Scott Hubbard re: GOLD 4 copd  maint on stiolto / pred prn just started  round / does not have ICS as rec  Dyspnea:  [employer] slow pace Cough: minimal  Sleeping: flat bed/ one pillow on side s  resp cc  SABA use: once day  02: none  Lung cancer screening: not done as of 09/08/2023 > referred again  Rec My office will be contacting you by phone for referral to lung cancer screening  at [phone] - if you don't hear back from my office within one week please call us back or notify us thru MyChart  and we'll address it right away.  Plan A = Automatic = Always=    stiolto/ qvar 80 2 each am and 2 in evening  Plan B = Backup (to supplement plan A, not to replace it) Only use your albuterol inhaler as a rescue medication Plan C = Crisis (instead of Plan B but only if Plan B stops working) - only use your albuterol nebulizer if you first try Plan B  Plan D = Deltasone = Prednisone  If ABC and not helping > Prednisone 10 mg take  4 each am x 2 days,   2 each am x 2 days,  1 each am x 2 days and stop   Please schedule a follow up office visit in 4 weeks, sooner if needed  with all medications /inhalers/ solutions in hand      10/06/2023  f/u ov/[place] office/Scott Hubbard re: GOLD 4 copd / maint on stiolto did  bring all meds  Chief Complaint  Patient presents with   Follow-up    Breathing is unchanged. Requests pred taper to keep on  hand. He has occ wheezing.   Dyspnea:  [employer] shopping  Cough: min congestion > white  Sleeping: flat bed on L side one pillow s    resp cc  SABA use: avg one  02: none   Lung cancer screening: says problems with phone    No obvious day to day or daytime variability or assoc excess/ purulent sputum or mucus plugs or hemoptysis or cp or chest tightness, subjective wheeze or overt sinus or hb symptoms.    Also denies any obvious fluctuation of symptoms with weather or environmental changes or other aggravating or alleviating factors except as outlined above   No unusual exposure hx or h/o childhood pna/ asthma or knowledge of premature birth.  Current Allergies, Complete Past Medical History, Past Surgical History, Family History, and Social History were reviewed in [name] record.  ROS  The following are not active complaints unless bolded Hoarseness, sore throat, dysphagia, dental problems, itching, sneezing,  nasal congestion or discharge of excess mucus or purulent secretions, ear ache,   fever, chills, sweats, unintended wt  loss or wt gain, classically pleuritic or exertional cp,  orthopnea pnd or arm/hand swelling  or leg swelling, presyncope, palpitations, abdominal pain, anorexia, nausea, vomiting, diarrhea  or change in bowel habits or change in bladder habits, change in stools or change in urine, dysuria, hematuria,  rash, arthralgias, visual complaints, headache/sporadic/ typically in pm > am, numbness, weakness or ataxia or problems with walking or coordination,  change in mood or  memory.        Current Meds  Medication Sig   albuterol (VENTOLIN HFA) 108 (90 Base) MCG/ACT inhaler USE 2 PUFFS EVERY 4 HOURS AS NEEDED ONLY IF YOUR CAN'T CATCH YOUR BREATH   cyclobenzaprine (FLEXERIL) 10 MG tablet TAKE 1 TABLET BY MOUTH TWICE A DAY AS NEEDED FOR MUSCLE SPASMS   famotidine (PEPCID) 20 MG tablet TAKE 1 TABLET (20 MG TOTAL) BY MOUTH DAILY AFTER SUPPER.   pantoprazole (PROTONIX) 40 MG tablet TAKE 1 TABLET (40 MG TOTAL) BY MOUTH DAILY. TAKE 30-60 MIN BEFORE FIRST MEAL OF THE DAY   rOPINIRole (REQUIP) 1 MG tablet TAKE 1 TABLET BY MOUTH EVERYDAY AT BEDTIME   Tiotropium Bromide-Olodaterol (STIOLTO RESPIMAT) 2.5-2.5 MCG/ACT AERS Inhale 2 puffs into the lungs daily.   valsartan-hydrochlorothiazide (DIOVAN-HCT) 80-12.5 MG tablet TAKE 1 TABLET BY MOUTH EVERY DAY                                                   Past Medical History:  Diagnosis Date   Chest discomfort    negative stress echocardiogram-01/2011   Chronic bronchitis    COPD (chronic obstructive pulmonary disease) (HCC)    DJD (degenerative joint disease)    Dyspnea    Gastro-esophageal reflux disease with esophagitis    GERD (gastroesophageal reflux disease)    Hyperlipidemia    Hypertension    Migraine    PVC (premature ventricular contraction)    Frequent   Tobacco abuse    Tobacco abuse    40 pack years       Objective:   Wts  10/06/2023      175  09/08/2023        172  03/17/2023        173  09/29/2022      176 09/01/2022  172   06/09/2022        176 12/16/2021      183  05/05/2021        192  01/28/2021         203 11/06/2020      200  09/08/2020       195   05/09/20 197 lb (89.4 kg)  04/30/20 197 lb (89.4 kg)  04/09/20 197 lb (89.4 kg)    Vital signs reviewed  10/06/2023  - Note at rest 02 sats  97% on RA   General appearance:    chronically ill ; congested cough      HEENT :  Oropharynx  clear      NECK :  without JVD/Nodes/TM/ nl carotid upstrokes bilaterally   LUNGS: no acc muscle use,  Mod barrel  contour chest wall with bilateral  Distant exp  wheeze and  without cough on insp or exp maneuvers and mod  Hyperresonant  to  percussion bilaterally     CV:  RRR  no s3 or murmur or increase in P2, and no edema   ABD:  soft and nontender with pos mid insp Hoover's  in the supine position. No bruits or organomegaly appreciated, bowel sounds nl  MS:   Ext warm without deformities or   obvious joint restrictions , calf tenderness, cyanosis or clubbing  SKIN: warm and dry without lesions    NEURO:  alert, approp, nl sensorium with  no motor or cerebellar deficits apparent.              Assessment

## 2023-11-16 ENCOUNTER — Telehealth: Payer: Self-pay | Admitting: Internal Medicine

## 2023-11-16 ENCOUNTER — Encounter: Payer: Self-pay | Admitting: Internal Medicine

## 2024-01-05 ENCOUNTER — Encounter: Payer: Self-pay | Admitting: Internal Medicine

## 2024-01-05 ENCOUNTER — Ambulatory Visit: Payer: 59 | Admitting: Internal Medicine

## 2024-01-05 VITALS — BP 109/75 | HR 64 | Ht 77.0 in | Wt 170.0 lb

## 2024-01-05 DIAGNOSIS — J42 Unspecified chronic bronchitis: Secondary | ICD-10-CM

## 2024-01-05 DIAGNOSIS — W19XXXA Unspecified fall, initial encounter: Secondary | ICD-10-CM | POA: Insufficient documentation

## 2024-01-05 DIAGNOSIS — M7062 Trochanteric bursitis, left hip: Secondary | ICD-10-CM | POA: Diagnosis not present

## 2024-01-05 DIAGNOSIS — R0789 Other chest pain: Secondary | ICD-10-CM

## 2024-01-05 DIAGNOSIS — J449 Chronic obstructive pulmonary disease, unspecified: Secondary | ICD-10-CM | POA: Diagnosis not present

## 2024-01-05 DIAGNOSIS — N1832 Chronic kidney disease, stage 3b: Secondary | ICD-10-CM | POA: Diagnosis not present

## 2024-01-05 DIAGNOSIS — R739 Hyperglycemia, unspecified: Secondary | ICD-10-CM | POA: Diagnosis not present

## 2024-01-05 DIAGNOSIS — K219 Gastro-esophageal reflux disease without esophagitis: Secondary | ICD-10-CM | POA: Diagnosis not present

## 2024-01-05 DIAGNOSIS — I10 Essential (primary) hypertension: Secondary | ICD-10-CM

## 2024-01-05 DIAGNOSIS — G2581 Restless legs syndrome: Secondary | ICD-10-CM | POA: Diagnosis not present

## 2024-01-05 DIAGNOSIS — E782 Mixed hyperlipidemia: Secondary | ICD-10-CM | POA: Diagnosis not present

## 2024-01-05 MED ORDER — PREDNISONE 10 MG PO TABS: ORAL_TABLET | ORAL | 0 refills | Status: DC

## 2024-01-05 NOTE — Assessment & Plan Note (Addendum)
Quit smoking in 2017 On Stiolto Albuterol nebs and inhaler PRN Has oral steroids for acute exacerbations Follows up with Dr Sherene Sires  Needs low-dose CT chest for lung cancer screening, but he wants to think about it. Also denies flu and pneumococcal vaccine.

## 2024-01-05 NOTE — Assessment & Plan Note (Signed)
On Pantoprazole 40 mg QD Partly contributed by chronic steroid therapy

## 2024-01-05 NOTE — Assessment & Plan Note (Signed)
Has left-sided chest wall tenderness, concerning for rib fracture Ordered x-ray of ribs, emphasized importance of getting x-ray He has oral prednisone, which can also help with chest wall pain/costochondritis Incentive spirometry

## 2024-01-05 NOTE — Patient Instructions (Addendum)
Plan A = Automatic = Always=    Stiolto 2 puffs each am   Plan B = Backup (to supplement plan A, not to replace it) Only use your albuterol inhaler as a rescue medication to be used if you can't catch your breath by resting or doing a relaxed purse lip breathing pattern.  - The less you use it, the better it will work when you need it. - Ok to use the inhaler up to 2 puffs  every 4 hours if you must but call for appointment if use goes up over your usual need - Don't leave home without it !!  (think of it like the spare tire for your car)   Plan C = Crisis (instead of Plan B but only if Plan B stops working) - only use your albuterol nebulizer if you first try Plan B and it fails to help > ok to use the nebulizer up to every 4 hours but if start needing it regularly call for immediate appointment  Also  Ok to try albuterol 15 min before an activity (on alternating days with inhaler then next day use the nebulizer )  that you know would usually make you short of breath and see if it makes any difference and if makes none then don't take albuterol after activity unless you can't catch your breath as this means it's the resting that helps, not the albuterol.       Plan D = when ABC not working  Prednisone take 2 daily until better then 1 daily x  5 days and stop   Please schedule a follow up visit in 3 months but call sooner if needed  with all medications /inhalers/ solutions in hand so we can verify exactly what you are taking. This includes all medications from all doctors and over the counters

## 2024-01-05 NOTE — Assessment & Plan Note (Signed)
AKI on CKD in the past, needs to increase water intake and cut down soft drinks On ARB and HCTZ Avoid nephrotoxic agents Reviewed US renal from ER Checked BMP from chart, stable GFR - recheck CMP, PTH and urine protein/creatinine ratio

## 2024-01-05 NOTE — Assessment & Plan Note (Signed)
Left  hip pain with tenderness likely due to hip bursitis Advised to apply ice for local relief Oral prednisone would also help with bursitis

## 2024-01-05 NOTE — Progress Notes (Signed)
Established Patient Office Visit  Subjective:  Patient ID: Scott Hubbard, male    DOB: Feb 14, 1956  Age: 68 y.o. MRN: [id number]  CC:  Chief Complaint  Patient presents with   Care Management    4 month f/u, reports a fall 2 weeks ago states he cracked his ribs.     HPI Scott Hubbard is a 68 y.o. male with past medical history of COPD, hypertension, CKD and previous tobacco abuse who presents for f/u of his chronic medical conditions.  HTN: BP is well-controlled. Takes medications regularly. Patient denies headache, dizziness, or palpitations.   COPD: He complains of chronic cough and dyspnea, for which he uses Stiolto and as needed albuterol neb and inhaler.  Followed by Dr. Sherene Sires.  He has oral steroids for acute exacerbations.  CKD: His S. Cr. and GFR had been improving in the last BMP, but has not had blood tests done since 02/24.  He currently denies any dysuria or hematuria.  He reports that he has stopped drinking water and instead has been taking diet pepsi.  He complains of chronic leg cramps and forearm cramps, for which he is taking Flexeril now.  He has seen improvement in his leg cramps, but still has mild cramps in his UE.  He has tried ropinirole, but did not have adequate response.  Fall: He had a mechanical fall in his yard on 12/24/23.  He fell on his left side, and had left-sided chest wall pain after the fall.  Denies any head injury.  He did not get any medical attention.  He believes he has a rib fracture, as he had a similar pain in the past when he had rib fracture.  His rib pain has been improving now.  He still does not want to get x-ray of ribs despite counseling.  Denies hemoptysis.  Denies current visible bruising.  He also reports chronic left hip pain. Pain is intermittent,  worse with lying on left side and walking.  He denies any major injury to the hip area from the fall.  Denies any numbness or tingling of the LE.  Past Medical History:  Diagnosis Date    Chest discomfort    negative stress echocardiogram-01/2011   Chronic bronchitis    CKD (chronic kidney disease)    COPD (chronic obstructive pulmonary disease) (HCC)    DJD (degenerative joint disease)    Gastro-esophageal reflux disease with esophagitis    GERD (gastroesophageal reflux disease)    Hyperlipidemia    Hypertension    Migraine    PVC (premature ventricular contraction)    Frequent   Tobacco abuse    40 pack years    Past Surgical History:  Procedure Laterality Date   COLONOSCOPY WITH PROPOFOL N/A 02/04/2023   Lanelle Bal, DO; inadequate prep   COLONOSCOPY WITH PROPOFOL N/A 04/29/2023   Procedure: COLONOSCOPY WITH PROPOFOL;  Surgeon: Lanelle Bal, DO;  Location: [facility];  Service: Endoscopy;  Laterality: N/A;  1:15 pm, ASA 3   POLYPECTOMY  04/29/2023   Procedure: POLYPECTOMY;  Surgeon: Lanelle Bal, DO;  Location: [facility];  Service: Endoscopy;;    Family History  Problem Relation Age of Onset   Colon cancer Neg Hx     Social History   Socioeconomic History   Marital status: Legally Separated    Spouse name: Not on file   Number of children: 2   Years of education: Not on file   Highest education level:  Not on file  Occupational History   Occupation: unemployed    Employer: UNEMPLOYED  Tobacco Use   Smoking status: Former    Current packs/day: 0.00    Average packs/day: 1.5 packs/day for 40.0 years (60.0 ttl pk-yrs)    Types: Cigarettes    Start date: 04/27/1976    Quit date: 04/27/2016    Years since quitting: 7.6   Smokeless tobacco: Never  Vaping Use   Vaping status: Never Used  Substance and Sexual Activity   Alcohol use: No   Drug use: No   Sexual activity: Not on file  Other Topics Concern   Not on file  Social History Narrative   Lives with girlfriend Beryle Flock  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     Social Drivers of Corporate investment banker Strain: Low Risk  (04/12/2023)   Overall Financial Resource Strain  (CARDIA)    Difficulty of Paying Living Expenses: Not hard at all  Food Insecurity: No Food Insecurity (04/12/2023)   Hunger Vital Sign    Worried About Running Out of Food in the Last Year: Never true    Ran Out of Food in the Last Year: Never true  Transportation Needs: No Transportation Needs (04/12/2023)   PRAPARE - Administrator, Civil Service (Medical): No    Lack of Transportation (Non-Medical): No  Physical Activity: Sufficiently Active (09/06/2022)   Exercise Vital Sign    Days of Exercise per Week: 5 days    Minutes of Exercise per Session: 30 min  Stress: No Stress Concern Present (04/12/2023)   [employer] of Occupational Health - Occupational Stress Questionnaire    Feeling of Stress : Only a little  Social Connections: Moderately Integrated (09/06/2022)   Social Connection and Isolation Panel [NHANES]    Frequency of Communication with Friends and Family: More than three times a week    Frequency of Social Gatherings with Friends and Family: Twice a week    Attends Religious Services: 1 to 4 times per year    Active Member of [employer] or Organizations: No    Attends Banker Meetings: Never    Marital Status: Living with partner  Intimate Partner Violence: Not At Risk (09/06/2022)   Humiliation, Afraid, Rape, and Kick questionnaire    Fear of Current or Ex-Partner: No    Emotionally Abused: No    Physically Abused: No    Sexually Abused: No    Outpatient Medications Prior to Visit  Medication Sig Dispense Refill   albuterol (PROVENTIL) (2.5 MG/3ML) 0.083% nebulizer solution Take 3 mLs (2.5 mg total) by nebulization every 4 (four) hours as needed for wheezing or shortness of breath. 75 mL 12   albuterol (VENTOLIN HFA) 108 (90 Base) MCG/ACT inhaler USE 2 PUFFS EVERY 4 HOURS AS NEEDED ONLY IF YOUR CAN'T CATCH YOUR BREATH 18 each 6   beclomethasone (QVAR REDIHALER) 80 MCG/ACT inhaler Take 2 puffs first thing in am and then another 2 puffs about 12  hours later.     cyclobenzaprine (FLEXERIL) 10 MG tablet TAKE 1 TABLET BY MOUTH TWICE A DAY AS NEEDED FOR MUSCLE SPASMS 60 tablet 3   famotidine (PEPCID) 20 MG tablet TAKE 1 TABLET (20 MG TOTAL) BY MOUTH DAILY AFTER SUPPER. 90 tablet 1   pantoprazole (PROTONIX) 40 MG tablet TAKE 1 TABLET (40 MG TOTAL) BY MOUTH DAILY. TAKE 30-60 MIN BEFORE FIRST MEAL OF THE DAY 90 tablet 3   predniSONE (DELTASONE) 10 MG tablet 2 with breakfast  until better then 1 daily x 5 days and stop 100 tablet 0   rOPINIRole (REQUIP) 1 MG tablet TAKE 1 TABLET BY MOUTH EVERYDAY AT BEDTIME 90 tablet 1   Tiotropium Bromide-Olodaterol (STIOLTO RESPIMAT) 2.5-2.5 MCG/ACT AERS Inhale 2 puffs into the lungs daily. 4 g 11   valsartan-hydrochlorothiazide (DIOVAN-HCT) 80-12.5 MG tablet TAKE 1 TABLET BY MOUTH EVERY DAY 90 tablet 3   No facility-administered medications prior to visit.    No Known Allergies  ROS Review of Systems  Constitutional:  Negative for chills and fever.  HENT:  Negative for sinus pressure, sinus pain and sore throat.   Eyes:  Negative for pain and discharge.  Respiratory:  Positive for cough, shortness of breath (chronic) and wheezing.   Cardiovascular:  Negative for palpitations and leg swelling.  Gastrointestinal:  Negative for constipation, diarrhea, nausea and vomiting.  Endocrine: Negative for polydipsia and polyuria.  Genitourinary:  Negative for dysuria and hematuria.  Musculoskeletal:  Negative for neck pain and neck stiffness.  Skin:  Positive for rash (b/l UE).  Neurological:  Negative for dizziness, weakness, numbness and headaches.  Psychiatric/Behavioral:  Negative for agitation and behavioral problems.       Objective:    Physical Exam Vitals reviewed.  Constitutional:      General: He is not in acute distress.    Appearance: He is not diaphoretic.  HENT:     Head: Normocephalic and atraumatic.     Mouth/Throat:     Mouth: Mucous membranes are moist.  Eyes:     General: No  scleral icterus.    Extraocular Movements: Extraocular movements intact.  Cardiovascular:     Rate and Rhythm: Normal rate and regular rhythm.     Heart sounds: Normal heart sounds. No murmur heard. Pulmonary:     Effort: Pulmonary effort is normal. No respiratory distress.     Breath sounds: Wheezing (B/l, diffuse (mild)) present. No rales.  Chest:     Chest wall: Tenderness (Left-sided) present.  Abdominal:     General: Bowel sounds are normal.     Palpations: Abdomen is soft.     Tenderness: There is no abdominal tenderness.     Hernia: A hernia (Umbilical) is present.  Musculoskeletal:        General: No swelling or signs of injury.     Cervical back: Neck supple. No tenderness.     Left hip: Tenderness present. Normal range of motion.     Right lower leg: No edema.     Left lower leg: No edema.     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     Findings: Rash (Erythematous plaques over b/l UE) present.     Comments: Herpetic wart over left palm  Neurological:     General: No focal deficit present.     Mental Status: He is alert and oriented to person, place, and time.     Sensory: No sensory deficit.     Motor: No weakness.  Psychiatric:        Mood and Affect: Mood normal.        Behavior: Behavior normal.     BP 109/75   Pulse 64   Ht 6\' 5"  (1.956 m)   Wt 170 lb (77.1 kg)   SpO2 96%   BMI 20.16 kg/m  Wt Readings from Last 3 Encounters:  01/05/24 170 lb (77.1 kg)  01/05/24 170 lb (77.1 kg)  10/06/23 175 lb (79.4 kg)    Lab Results  Component Value Date   TSH 0.693 06/09/2022   Lab Results  Component Value Date   WBC 11.7 (H) 06/09/2022   HGB 13.2 06/09/2022   HCT 40.0 06/09/2022   MCV 88 06/09/2022   PLT 190 06/09/2022   Lab Results  Component Value Date   NA 137 02/02/2023   K 4.4 02/02/2023   CO2 27 02/02/2023   GLUCOSE 95 02/02/2023   BUN 25 (H) 02/02/2023   CREATININE 1.88 (H)  02/02/2023   BILITOT 0.5 06/09/2022   ALKPHOS 71 06/09/2022   AST 16 06/09/2022   ALT 11 06/09/2022   PROT 6.7 06/09/2022   ALBUMIN 4.4 06/09/2022   CALCIUM 8.8 (L) 02/02/2023   ANIONGAP 8 02/02/2023   EGFR 42 (L) 10/25/2022   Lab Results  Component Value Date   CHOL 154 06/09/2022   Lab Results  Component Value Date   HDL 35 (L) 06/09/2022   Lab Results  Component Value Date   LDLCALC 103 (H) 06/09/2022   Lab Results  Component Value Date   TRIG 84 06/09/2022   Lab Results  Component Value Date   CHOLHDL 4.4 06/09/2022   Lab Results  Component Value Date   HGBA1C 5.7 (H) 06/09/2022      Assessment & Plan:   Problem List Items Addressed This Visit       Cardiovascular and Mediastinum   Essential hypertension   BP Readings from Last 1 Encounters:  01/05/24 109/75   Well-controlled with Valsartan-HCTZ Counseled for compliance with the medications Advised DASH diet and moderate exercise/walking as tolerated        Respiratory   Chronic bronchitis (HCC)   Quit smoking in 2017 On Stiolto Albuterol nebs and inhaler PRN Has oral steroids for acute exacerbations Follows up with Dr Sherene Sires  Needs low-dose CT chest for lung cancer screening, but he wants to think about it. Also denies flu and pneumococcal vaccine.        Digestive   GERD (gastroesophageal reflux disease)   On Pantoprazole 40 mg QD Partly contributed by chronic steroid therapy        Musculoskeletal and Integument   Trochanteric bursitis of left hip   Left  hip pain with tenderness likely due to hip bursitis Advised to apply ice for local relief Oral prednisone would also help with bursitis        Genitourinary   Stage 3b chronic kidney disease (HCC)   AKI on CKD in the past, needs to increase water intake and cut down soft drinks On ARB and HCTZ Avoid nephrotoxic agents Reviewed US renal from ER Checked BMP from chart, stable GFR - recheck CMP, PTH and urine protein/creatinine  ratio        Other   Fall - Primary   Has left-sided chest wall tenderness, concerning for rib fracture Ordered x-ray of ribs, emphasized importance of getting x-ray He has oral prednisone, which can also help with chest wall pain/costochondritis Incentive spirometry      Other Visit Diagnoses       Left-sided chest wall pain       Relevant Orders   DG Ribs Unilateral Left       No orders of the defined types were placed in this encounter.   Follow-up: Return in about 3 months (around 04/04/2024).    Anabel Halon, MD

## 2024-01-05 NOTE — Progress Notes (Signed)
Scott Hubbard, male    DOB: May 19, 1956    MRN: [id number]   Brief patient profile:  20 yowm MM/quit smoking 04/2016 @ onset of severe spells of sob and since then doe gradually worse despite rx with flovent/ stiolto so referred to pulmonary clinic 04/09/2020 by Dr  Judee Clara     History of Present Illness  04/09/2020  Pulmonary/ 1st office eval/Anthonia Monger  Chief Complaint  Patient presents with   Pulmonary Consult    Referred by Dr. Dorette Grate for eval of COPD. Former pt of Dr Juanetta Gosling. He states he gets SOB "when I do anything".    Dyspnea:  [employer] x 2 aisles but walks "faster than other people" (walked very slowly in office) so MMRC3 = can't walk 100 yards even at a slow pace at a flat grade s stopping due to sob    Last time mb and back was prior to prior to when he quit smoking as has long driveway with incline on way back  Cough: none Sleep: flat bed / one pillow L side s respiratory symptoms  SABA use: 2 pffs each am / does not know how when to use hfa vs neb     rec Plan A = Automatic = Always=    Stiolto x 2 pffs/ flovent x 2puffs  first thing in am and repeat flovent in pm  Prednisone 10 mg x 2 daily until breathing better then 1 daily x a week and stop (#60)     Take valsartan 40 mg x 2 pills daily until you see your cardiologist            Plan B = Backup (to supplement plan A, not to replace it) Only use your albuterol inhaler as a rescue medication   09/08/2023  f/u ov/[place] office/Khairi Garman re: GOLD 4 copd  maint on stiolto / pred prn just started  round / does not have ICS as rec  Dyspnea:  whole [employer] slow pace Cough: minimal  Sleeping: flat bed/ one pillow on side s  resp cc  SABA use: once day  02: none  Lung cancer screening: not done as of 09/08/2023 > referred again  Rec My office will be contacting you by phone for referral to lung cancer screening  at [phone] - if you don't hear back from my office within one week please call us back or notify us thru MyChart  and we'll address it right away.  Plan A = Automatic = Always=    stiolto/ qvar 80 2 each am and 2 in evening  Plan B = Backup (to supplement plan A, not to replace it) Only use your albuterol inhaler as a rescue medication Plan C = Crisis (instead of Plan B but only if Plan B stops working) - only use your albuterol nebulizer if you first try Plan B  Plan D = Deltasone = Prednisone  If ABC and not helping > Prednisone 10 mg take  4 each am x 2 days,   2 each am x 2 days,  1 each am x 2 days and stop   Please schedule a follow up office visit in 4 weeks, sooner if needed  with all medications /inhalers/ solutions in hand      10/06/2023  f/u ov/[place] office/Gevon Markus re: GOLD 4 copd / maint on stiolto did  bring all meds  Chief Complaint  Patient presents with   Follow-up    Breathing is unchanged. Requests pred taper to keep on  hand. He has occ wheezing.   Dyspnea:  [employer] shopping  Cough: min congestion > white  Sleeping: flat bed on L side one pillow s    resp cc  SABA use: avg one  02: none  Lung cancer screening: says problems with phone  Rec  My office will be contacting you by phone for referral to lung cancer screening  at [phone] - declined Plan A = Automatic = Always=    stiolto just in AM / qvar 80 2 each am and 2 in evening   Plan B = Backup (to supplement plan A, not to replace it) Only use your albuterol inhaler as a rescue medication  Plan C = Crisis (instead of Plan B but only if Plan B stops working) - only use your albuterol nebulizer if you first try Plan B and it fails to help > ok to use the nebulizer up to every 4 hours but if start needing it regularly call for immediate appointment Also  Ok to try albuterol 15 min (either the inhaler or the nebulizer)  before an activity (on alternating days)  that you know would usually make you short of breath  Plan D = Deltasone = Prednisone  If ABC and not helping > Prednisone 10 mg take  4 each am x 2 days,   2  each am x 2 days,  1 each am x 2 days and stop     01/05/2024  f/u ov/[place] office/Amiel Sharrow re: GOLD 4 copd  maint on stiolto   check on LCS- declined  Chief Complaint  Patient presents with   COPD   Shortness of Breath   Dyspnea:  still shopping at [employer]  Cough: none  Sleeping: flat bed on side 1 pillow   resp cc  SABA use: 0-2 x per day  02: none    No obvious day to day or daytime variability or assoc excess/ purulent sputum or mucus plugs or hemoptysis or cp or chest tightness, subjective wheeze or overt sinus or hb symptoms.    Also denies any obvious fluctuation of symptoms with weather or environmental changes or other aggravating or alleviating factors except as outlined above   No unusual exposure hx or h/o childhood pna/ asthma or knowledge of premature birth.  Current Allergies, Complete Past Medical History, Past Surgical History, Family History, and Social History were reviewed in [name] record.  ROS  The following are not active complaints unless bolded Hoarseness, sore throat, dysphagia, dental problems, itching, sneezing,  nasal congestion or discharge of excess mucus or purulent secretions, ear ache,   fever, chills, sweats, unintended wt loss or wt gain, classically pleuritic or exertional cp,  orthopnea pnd or arm/hand swelling  or leg swelling, presyncope, palpitations, abdominal pain, anorexia, nausea, vomiting, diarrhea  or change in bowel habits or change in bladder habits, change in stools or change in urine, dysuria, hematuria,  rash, arthralgias, visual complaints, headache, numbness, weakness or ataxia or problems with walking or coordination,  change in mood or  memory.        Current Meds  Medication Sig   albuterol (PROVENTIL) (2.5 MG/3ML) 0.083% nebulizer solution Take 3 mLs (2.5 mg total) by nebulization every 4 (four) hours as needed for wheezing or shortness of breath.   albuterol (VENTOLIN HFA) 108 (90 Base) MCG/ACT  inhaler USE 2 PUFFS EVERY 4 HOURS AS NEEDED ONLY IF YOUR CAN'T CATCH YOUR BREATH   beclomethasone (QVAR REDIHALER) 80 MCG/ACT inhaler Take 2  puffs first thing in am and then another 2 puffs about 12 hours later.   cyclobenzaprine (FLEXERIL) 10 MG tablet TAKE 1 TABLET BY MOUTH TWICE A DAY AS NEEDED FOR MUSCLE SPASMS   famotidine (PEPCID) 20 MG tablet TAKE 1 TABLET (20 MG TOTAL) BY MOUTH DAILY AFTER SUPPER.   pantoprazole (PROTONIX) 40 MG tablet TAKE 1 TABLET (40 MG TOTAL) BY MOUTH DAILY. TAKE 30-60 MIN BEFORE FIRST MEAL OF THE DAY   rOPINIRole (REQUIP) 1 MG tablet TAKE 1 TABLET BY MOUTH EVERYDAY AT BEDTIME   Tiotropium Bromide-Olodaterol (STIOLTO RESPIMAT) 2.5-2.5 MCG/ACT AERS Inhale 2 puffs into the lungs daily.   valsartan-hydrochlorothiazide (DIOVAN-HCT) 80-12.5 MG tablet TAKE 1 TABLET BY MOUTH EVERY DAY                                                   Past Medical History:  Diagnosis Date   Chest discomfort    negative stress echocardiogram-01/2011   Chronic bronchitis    COPD (chronic obstructive pulmonary disease) (HCC)    DJD (degenerative joint disease)    Dyspnea    Gastro-esophageal reflux disease with esophagitis    GERD (gastroesophageal reflux disease)    Hyperlipidemia    Hypertension    Migraine    PVC (premature ventricular contraction)    Frequent   Tobacco abuse    Tobacco abuse    40 pack years       Objective:   Wts  01/05/2024        170  10/06/2023      175  09/08/2023        172  03/17/2023        173  09/29/2022      176 09/01/2022        172  06/09/2022        176 12/16/2021      183  05/05/2021        192  01/28/2021         203 11/06/2020      200  09/08/2020       195   05/09/20 197 lb (89.4 kg)  04/30/20 197 lb (89.4 kg)  04/09/20 197 lb (89.4 kg)    Vital signs reviewed  01/05/2024  - Note at rest 02 sats  96% on RA   General appearance:    amb disheveled elderly wm nad   HEENT :  Oropharynx  clear     NECK :  without JVD/Nodes/TM/  nl carotid upstrokes bilaterally   LUNGS: no acc muscle use,  Mod barrel  contour chest wall with bilateral  Distant exp  wheeze and  without cough on insp or exp maneuvers and mod  Hyperresonant  to  percussion bilaterally     CV:  RRR  no s3 or murmur or increase in P2, and no edema   ABD:  soft and nontender   MS:   Ext warm without deformities or   obvious joint restrictions , calf tenderness, cyanosis or clubbing  SKIN: warm and dry without lesions    NEURO:  alert, approp, nl sensorium with  no motor or cerebellar deficits apparent.                    Assessment

## 2024-01-05 NOTE — Patient Instructions (Signed)
Please continue to take medications as prescribed.  Please continue to follow low salt diet and ambulate as tolerated.  Please maintain at least 64 ounces of fluid intake in a day.

## 2024-01-05 NOTE — Assessment & Plan Note (Signed)
BP Readings from Last 1 Encounters:  01/05/24 109/75   Well-controlled with Valsartan-HCTZ Counseled for compliance with the medications Advised DASH diet and moderate exercise/walking as tolerated

## 2024-01-06 NOTE — Assessment & Plan Note (Addendum)
Quit smoking 2017  - Spirometry  01/04/17  FEV1 0.99 (23%)  Ratio 0.28 p ? Prior  = Allergy profile 04/09/20  >  Eos 0. 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 04/09/2020  MM   Level 156  - 04/09/2020 rec pred 20 until better then 10 mg daily x one week wean - 05/09/2020  After extensive coaching inhaler device,  effectiveness =    90% > rec  continue flovent 220/ stiolto and completely  taper off prednisone  - PFT's  06/24/20  FEV1 1.15 (26 % ) ratio 0.27  p 35 % improvement from saba p stiolto prior to study with DLCO  11.45 (35%) corrects to 1.88 (46%)  for alv volume and FV curve classic curvature  - 09/08/2020  try breztri  - 10/09/2020  After extensive coaching inhaler device,  effectiveness =    90% with smi > change back to stiolto and pred ceiling 20/floor 10 and added protonix 40 mg q am ac as pseudowheeze on exam - 11/06/2020 added pm doses of pepcid/ gerd recs including bed blocks   - .11/06/2020 pred floor of 10 mg / ceiling of 20 mg -  11/06/2020   Walked RA  approx   250 ft  @ avg pace  stopped due to  Sob/ fatigue with sats still 96%  Typical of a pink puffer  -  12/15/2020  After extensive coaching inhaler device,  effectiveness =    90%  -  01/28/2021   Walked RA  approx   400 ft  @ slow to moderate pace  stopped due to  Sob  With sats 94% - 05/05/2021 resumed prednisone  D  2 a day until better then 1 a day x 5 days and stop. - 06/09/2022  continue stiolto  Plus flovent 220 2bid  - 09/01/2022  After extensive coaching inhaler device,  effectiveness =    90% with smi, 50% with hfa  - 09/01/2022 added back Prednisone as plan D = 6 day taper  - 09/01/2022   Walked on RA  x  1  lap(s) =  approx 150  ft  @ moderate pace, stopped due to sob with lowest 02 sats 95%   - 09/29/2022 refused walk  - 09/08/2023  After extensive coaching inhaler device,  effectiveness =    75% smi/ doe not have ICS rx  called in as qvar in June 2024 - 09/08/2023   Walked on RA  x  2  lap(s) =  approx 300  ft  @ slow but  steady pace, stopped due to tired > sob  with lowest 02 sats 90% > declined rehab (again)   -  01/05/2024  After extensive coaching inhaler device,  effectiveness =    80% with smi  - 01/05/2024  Prednisone as plan D    Group D (now reclassified as E) in terms of symptom/risk and laba/lama/ICS  therefore appropriate rx at this point >>>  stiolto plus qvar plus approp saba plus pred as plan D  - strongly rec trial of ohtuvayre bid if continues to require pred but he declined this as well as pulm rehab today  Re SABA :  I spent extra time with pt today reviewing appropriate use of albuterol for prn use on exertion with the following points: 1) saba is for relief of sob that does not improve by walking a slower pace or resting but rather if the pt does not improve after  trying this first. 2) If the pt is convinced, as many are, that saba helps recover from activity faster then it's easy to tell if this is the case by re-challenging : ie stop, take the inhaler, then p 5 minutes try the exact same activity (intensity of workload) that just caused the symptoms and see if they are substantially diminished or not after saba 3) if there is an activity that reproducibly causes the symptoms, try the saba 15 min before the activity on alternate days   If in fact the saba really does help, then fine to continue to use it prn but advised may need to look closer at the maintenance regimen being used to achieve better control of airways disease with exertion.          Each maintenance medication was reviewed in detail including emphasizing most importantly the difference between maintenance and prns and under what circumstances the prns are to be triggered using an action plan format where appropriate.  Total time for H and P, chart review, counseling, reviewing hfa/smi/ neb device(s) and generating customized AVS unique to this office visit / same day charting = 31 min

## 2024-01-07 LAB — TSH: TSH: 1.42 u[IU]/mL (ref 0.450–4.500)

## 2024-01-07 LAB — PARATHYROID HORMONE, INTACT (NO CA): PTH: 53 pg/mL (ref 15–65)

## 2024-01-07 LAB — CBC WITH DIFFERENTIAL/PLATELET
Basophils Absolute: 0.1 10*3/uL (ref 0.0–0.2)
Basos: 1 %
EOS (ABSOLUTE): 0.2 10*3/uL (ref 0.0–0.4)
Eos: 2 %
Hematocrit: 39.1 % (ref 37.5–51.0)
Hemoglobin: 12.9 g/dL — ABNORMAL LOW (ref 13.0–17.7)
Immature Grans (Abs): 0.1 10*3/uL (ref 0.0–0.1)
Immature Granulocytes: 1 %
Lymphocytes Absolute: 2.6 10*3/uL (ref 0.7–3.1)
Lymphs: 22 %
MCH: 29.7 pg (ref 26.6–33.0)
MCHC: 33 g/dL (ref 31.5–35.7)
MCV: 90 fL (ref 79–97)
Monocytes Absolute: 0.8 10*3/uL (ref 0.1–0.9)
Monocytes: 7 %
Neutrophils Absolute: 7.7 10*3/uL — ABNORMAL HIGH (ref 1.4–7.0)
Neutrophils: 67 %
Platelets: 329 10*3/uL (ref 150–450)
RBC: 4.35 x10E6/uL (ref 4.14–5.80)
RDW: 12.9 % (ref 11.6–15.4)
WBC: 11.4 10*3/uL — ABNORMAL HIGH (ref 3.4–10.8)

## 2024-01-07 LAB — CMP14+EGFR
ALT: 8 [IU]/L (ref 0–44)
AST: 14 [IU]/L (ref 0–40)
Albumin: 4.1 g/dL (ref 3.9–4.9)
Alkaline Phosphatase: 83 [IU]/L (ref 44–121)
BUN/Creatinine Ratio: 23 (ref 10–24)
BUN: 34 mg/dL — ABNORMAL HIGH (ref 8–27)
Bilirubin Total: 0.3 mg/dL (ref 0.0–1.2)
CO2: 29 mmol/L (ref 20–29)
Calcium: 9.3 mg/dL (ref 8.6–10.2)
Chloride: 100 mmol/L (ref 96–106)
Creatinine, Ser: 1.51 mg/dL — ABNORMAL HIGH (ref 0.76–1.27)
Globulin, Total: 2.1 g/dL (ref 1.5–4.5)
Glucose: 96 mg/dL (ref 70–99)
Potassium: 4.9 mmol/L (ref 3.5–5.2)
Sodium: 141 mmol/L (ref 134–144)
Total Protein: 6.2 g/dL (ref 6.0–8.5)
eGFR: 50 mL/min/{1.73_m2} — ABNORMAL LOW (ref >59)

## 2024-01-07 LAB — PROTEIN / CREATININE RATIO, URINE
Creatinine, Urine: 117.6 mg/dL
Protein, Ur: 10.6 mg/dL
Protein/Creat Ratio: 90 mg/g{creat} (ref 0–200)

## 2024-01-07 LAB — LIPID PANEL
Chol/HDL Ratio: 3.6 {ratio} (ref 0.0–5.0)
Cholesterol, Total: 195 mg/dL (ref 100–199)
HDL: 54 mg/dL (ref >39)
LDL Chol Calc (NIH): 118 mg/dL — ABNORMAL HIGH (ref 0–99)
Triglycerides: 127 mg/dL (ref 0–149)
VLDL Cholesterol Cal: 23 mg/dL (ref 5–40)

## 2024-01-07 LAB — HEMOGLOBIN A1C
Est. average glucose Bld gHb Est-mCnc: 123 mg/dL
Hgb A1c MFr Bld: 5.9 % — ABNORMAL HIGH (ref 4.8–5.6)

## 2024-01-07 LAB — PSA: Prostate Specific Ag, Serum: 0.9 ng/mL (ref 0.0–4.0)

## 2024-01-07 LAB — VITAMIN D 25 HYDROXY (VIT D DEFICIENCY, FRACTURES): Vit D, 25-Hydroxy: 13.5 ng/mL — ABNORMAL LOW (ref 30.0–100.0)

## 2024-01-09 ENCOUNTER — Ambulatory Visit: Payer: 59 | Admitting: Internal Medicine

## 2024-01-29 ENCOUNTER — Other Ambulatory Visit: Payer: Self-pay | Admitting: Internal Medicine

## 2024-03-03 ENCOUNTER — Other Ambulatory Visit: Payer: Self-pay | Admitting: Internal Medicine

## 2024-03-13 ENCOUNTER — Other Ambulatory Visit: Payer: Self-pay | Admitting: Internal Medicine
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
## 2024-04-12 ENCOUNTER — Encounter: Payer: Self-pay | Admitting: Internal Medicine

## 2024-04-12 ENCOUNTER — Ambulatory Visit: Payer: 59 | Admitting: Internal Medicine

## 2024-04-12 ENCOUNTER — Ambulatory Visit (INDEPENDENT_AMBULATORY_CARE_PROVIDER_SITE_OTHER): Payer: 59 | Admitting: Internal Medicine

## 2024-04-12 VITALS — BP 135/72 | HR 68 | Ht 77.0 in | Wt 169.0 lb

## 2024-04-12 VITALS — BP 135/72 | HR 68 | Ht 77.0 in | Wt 169.2 lb

## 2024-04-12 DIAGNOSIS — J449 Chronic obstructive pulmonary disease, unspecified: Secondary | ICD-10-CM

## 2024-04-12 DIAGNOSIS — M62838 Other muscle spasm: Secondary | ICD-10-CM

## 2024-04-12 DIAGNOSIS — Z87891 Personal history of nicotine dependence: Secondary | ICD-10-CM | POA: Diagnosis not present

## 2024-04-12 DIAGNOSIS — N1832 Chronic kidney disease, stage 3b: Secondary | ICD-10-CM | POA: Diagnosis not present

## 2024-04-12 DIAGNOSIS — D1721 Benign lipomatous neoplasm of skin and subcutaneous tissue of right arm: Secondary | ICD-10-CM | POA: Diagnosis not present

## 2024-04-12 DIAGNOSIS — J42 Unspecified chronic bronchitis: Secondary | ICD-10-CM

## 2024-04-12 DIAGNOSIS — G2581 Restless legs syndrome: Secondary | ICD-10-CM

## 2024-04-12 DIAGNOSIS — I10 Essential (primary) hypertension: Secondary | ICD-10-CM

## 2024-04-12 DIAGNOSIS — H00014 Hordeolum externum left upper eyelid: Secondary | ICD-10-CM | POA: Insufficient documentation

## 2024-04-12 MED ORDER — QVAR REDIHALER 80 MCG/ACT IN AERB: INHALATION_SPRAY | RESPIRATORY_TRACT | Status: AC

## 2024-04-12 MED ORDER — ROPINIROLE HCL 1 MG PO TABS: 1.0000 mg | ORAL_TABLET | Freq: Every day | ORAL | 1 refills | Status: DC

## 2024-04-12 MED ORDER — CYCLOBENZAPRINE HCL 10 MG PO TABS
ORAL_TABLET | ORAL | 3 refills | Status: DC
Start: 2024-04-12 — End: 2024-09-12

## 2024-04-12 MED ORDER — OHTUVAYRE 3 MG/2.5ML IN SUSP: 2.5000 mL | Freq: Two times a day (BID) | RESPIRATORY_TRACT | Status: AC

## 2024-04-12 NOTE — Assessment & Plan Note (Addendum)
 Better with Flexeril  PRN for leg cramps Continue Requip  1 mg at bedtime
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
## 2024-04-12 NOTE — Assessment & Plan Note (Signed)
 BP Readings from Last 1 Encounters:  04/12/24 135/72   Well-controlled with Valsartan -HCTZ Counseled for compliance with the medications Advised DASH diet and moderate exercise/walking as tolerated

## 2024-04-12 NOTE — Patient Instructions (Addendum)
 Please schedule Medicare Annual Wellness.  Please apply warm compresses over left eye for stye.  Please continue to take medications as prescribed.  Please continue to follow low salt diet and ambulate as tolerated.  Please maintain at least 64 ounces of fluid intake.

## 2024-04-12 NOTE — Assessment & Plan Note (Signed)
 Stable in size Not painful General surgery evaluation done in 01/22, but he did not want it removed and still denies

## 2024-04-12 NOTE — Assessment & Plan Note (Signed)
 Quit smoking in 2017 On Stiolto Albuterol nebs and inhaler PRN Has oral steroids for acute exacerbations Follows up with Dr Sherene Sires  Needs low-dose CT chest for lung cancer screening, but he wants to think about it. Also denies flu and pneumococcal vaccine.

## 2024-04-12 NOTE — Assessment & Plan Note (Signed)
 AKI on CKD in the past, needs to increase water  intake and cut down soft drinks On ARB and HCTZ Avoid nephrotoxic agents Reviewed US  renal from ER Checked BMP from chart, stable GFR - recheck BMP

## 2024-04-12 NOTE — Assessment & Plan Note (Signed)
 Advised to apply warm compresses If persistent, will prescribe erythromycin ointment

## 2024-04-12 NOTE — Progress Notes (Signed)
 Established Patient Office Visit  Subjective:  Patient ID: Scott Hubbard, male    DOB: June 21, 1956  Age: 68 y.o. MRN: [id number]  CC:  Chief Complaint  Patient presents with   Medical Management of Chronic Issues    3 MONTH F/U    HPI Clifford A Tsuchiya is a 68 y.o. male with past medical history of COPD, hypertension, CKD and previous tobacco abuse who presents for f/u of his chronic medical conditions.  HTN: BP is well-controlled. Takes medications regularly. Patient denies headache, dizziness, or palpitations.   COPD: He complains of chronic cough and dyspnea, for which he uses Stiolto and as needed albuterol  neb and inhaler.  Followed by Dr. Waymond Hailey.  He has oral steroids for acute exacerbations.  CKD: His S. Cr. and GFR had been improving in the last BMP.  He currently denies any dysuria or hematuria.  He reports that he has stopped drinking water  and instead has been taking diet pepsi.  He complains of chronic leg cramps and forearm cramps, for which he is taking Flexeril  now.  He has seen improvement in his leg cramps, but still has mild cramps in his UE.  He is also taking ropinirole  1 mg.  He also reports chronic left hip pain. Pain is intermittent,  worse with lying on left side and walking.  He denies any major injury to the hip area from the fall.  Denies any numbness or tingling of the LE.  He reports having a bump on left upper eyelid for the last 4 weeks.  Denies burning pain, eye discharge or visual disturbance.  Past Medical History:  Diagnosis Date   Chest discomfort    negative stress echocardiogram-01/2011   Chronic bronchitis    CKD (chronic kidney disease)    COPD (chronic obstructive pulmonary disease) (HCC)    DJD (degenerative joint disease)    Gastro-esophageal reflux disease with esophagitis    GERD (gastroesophageal reflux disease)    Hyperlipidemia    Hypertension    Migraine    PVC (premature ventricular contraction)    Frequent   Tobacco abuse    40  pack years    Past Surgical History:  Procedure Laterality Date   COLONOSCOPY WITH PROPOFOL  N/A 02/04/2023   Vinetta Greening, DO; inadequate prep   COLONOSCOPY WITH PROPOFOL  N/A 04/29/2023   Procedure: COLONOSCOPY WITH PROPOFOL ;  Surgeon: Vinetta Greening, DO;  Location: [facility];  Service: Endoscopy;  Laterality: N/A;  1:15 pm, ASA 3   POLYPECTOMY  04/29/2023   Procedure: POLYPECTOMY;  Surgeon: Vinetta Greening, DO;  Location: [facility];  Service: Endoscopy;;    Family History  Problem Relation Age of Onset   Colon cancer Neg Hx     Social History   Socioeconomic History   Marital status: Legally Separated    Spouse name: Not on file   Number of children: 2   Years of education: Not on file   Highest education level: Not on file  Occupational History   Occupation: unemployed    Employer: UNEMPLOYED  Tobacco Use   Smoking status: Former    Current packs/day: 0.00    Average packs/day: 1.5 packs/day for 40.0 years (60.0 ttl pk-yrs)    Types: Cigarettes    Start date: 04/27/1976    Quit date: 04/27/2016    Years since quitting: 7.9   Smokeless tobacco: Never  Vaping Use   Vaping status: Never Used  Substance and Sexual Activity   Alcohol use:  No   Drug use: No   Sexual activity: Not on file  Other Topics Concern   Not on file  Social History Narrative   Lives with girlfriend Jayne Mews  Lives with 8 miniture pincers.     Social Drivers of Corporate investment banker Strain: Low Risk  (04/12/2023)   Overall Financial Resource Strain (CARDIA)    Difficulty of Paying Living Expenses: Not hard at all  Food Insecurity: No Food Insecurity (04/12/2023)   Hunger Vital Sign    Worried About Running Out of Food in the Last Year: Never true    Ran Out of Food in the Last Year: Never true  Transportation Needs: No Transportation Needs (04/12/2023)   PRAPARE - Administrator, Civil Service (Medical): No    Lack of Transportation (Non-Medical): No   Physical Activity: Sufficiently Active (09/06/2022)   Exercise Vital Sign    Days of Exercise per Week: 5 days    Minutes of Exercise per Session: 30 min  Stress: No Stress Concern Present (04/12/2023)   [employer] of Occupational Health - Occupational Stress Questionnaire    Feeling of Stress : Only a little  Social Connections: Moderately Integrated (09/06/2022)   Social Connection and Isolation Panel [NHANES]    Frequency of Communication with Friends and Family: More than three times a week    Frequency of Social Gatherings with Friends and Family: Twice a week    Attends Religious Services: 1 to 4 times per year    Active Member of [employer] or Organizations: No    Attends Banker Meetings: Never    Marital Status: Living with partner  Intimate Partner Violence: Not At Risk (09/06/2022)   Humiliation, Afraid, Rape, and Kick questionnaire    Fear of Current or Ex-Partner: No    Emotionally Abused: No    Physically Abused: No    Sexually Abused: No    Outpatient Medications Prior to Visit  Medication Sig Dispense Refill   albuterol  (PROVENTIL ) (2.5 MG/3ML) 0.083% nebulizer solution Take 3 mLs (2.5 mg total) by nebulization every 4 (four) hours as needed for wheezing or shortness of breath. 75 mL 12   albuterol  (VENTOLIN  HFA) 108 (90 Base) MCG/ACT inhaler USE 2 PUFFS EVERY 4 HOURS AS NEEDED ONLY IF YOUR CAN'T CATCH YOUR BREATH 18 each 6   famotidine  (PEPCID ) 20 MG tablet TAKE 1 TABLET (20 MG TOTAL) BY MOUTH DAILY AFTER SUPPER. 90 tablet 1   pantoprazole  (PROTONIX ) 40 MG tablet TAKE 1 TABLET (40 MG TOTAL) BY MOUTH DAILY. TAKE 30-60 MIN BEFORE FIRST MEAL OF THE DAY 90 tablet 3   Tiotropium Bromide-Olodaterol (STIOLTO RESPIMAT ) 2.5-2.5 MCG/ACT AERS INHALE 2 PUFFS BY MOUTH INTO THE LUNGS DAILY 4 g 11   valsartan -hydrochlorothiazide  (DIOVAN -HCT) 80-12.5 MG tablet TAKE 1 TABLET BY MOUTH EVERY DAY 90 tablet 3   beclomethasone (QVAR  REDIHALER) 80 MCG/ACT inhaler Take 2 puffs  first thing in am and then another 2 puffs about 12 hours later.     cyclobenzaprine  (FLEXERIL ) 10 MG tablet TAKE 1 TABLET BY MOUTH TWICE A DAY AS NEEDED FOR MUSCLE SPASMS 60 tablet 3   rOPINIRole  (REQUIP ) 1 MG tablet TAKE 1 TABLET BY MOUTH EVERYDAY AT BEDTIME 90 tablet 1   predniSONE  (DELTASONE ) 10 MG tablet TAKE 2 TABLETS WITH BREAKFAST UNTIL BETTER THEN 1 DAILY X 5 DAYS AND STOP 100 tablet 0   No facility-administered medications prior to visit.    No Known Allergies  ROS Review of  Systems  Constitutional:  Negative for chills and fever.  HENT:  Negative for sinus pressure, sinus pain and sore throat.   Eyes:  Negative for pain and discharge.  Respiratory:  Positive for cough, shortness of breath (chronic) and wheezing.   Cardiovascular:  Negative for palpitations and leg swelling.  Gastrointestinal:  Negative for constipation, diarrhea, nausea and vomiting.  Endocrine: Negative for polydipsia and polyuria.  Genitourinary:  Negative for dysuria and hematuria.  Musculoskeletal:  Negative for neck pain and neck stiffness.  Skin:  Positive for rash (b/l UE).  Neurological:  Negative for dizziness, weakness, numbness and headaches.  Psychiatric/Behavioral:  Negative for agitation and behavioral problems.       Objective:    Physical Exam Vitals reviewed.  Constitutional:      General: He is not in acute distress.    Appearance: He is not diaphoretic.  HENT:     Head: Normocephalic and atraumatic.     Mouth/Throat:     Mouth: Mucous membranes are moist.  Eyes:     General: No scleral icterus.       Left eye: Hordeolum (Left upper eyelid) present.    Extraocular Movements: Extraocular movements intact.  Cardiovascular:     Rate and Rhythm: Normal rate and regular rhythm.     Heart sounds: Normal heart sounds. No murmur heard. Pulmonary:     Effort: Pulmonary effort is normal. No respiratory distress.     Breath sounds: Wheezing (B/l, diffuse (mild)) present. No rales.   Chest:     Chest wall: Tenderness (Left-sided) present.  Abdominal:     General: Bowel sounds are normal.     Palpations: Abdomen is soft.     Tenderness: There is no abdominal tenderness.     Hernia: A hernia (Umbilical) is present.  Musculoskeletal:        General: No swelling or signs of injury.     Cervical back: Neck supple. No tenderness.     Left hip: Tenderness present. Normal range of motion.     Right lower leg: No edema.     Left lower leg: No edema.     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     Findings: Rash (Erythematous plaques over b/l UE) present.     Comments: Herpetic wart over left palm About 8 cm in diameter lipoma in right axillary area  Neurological:     General: No focal deficit present.     Mental Status: He is alert and oriented to person, place, and time.     Sensory: No sensory deficit.     Motor: No weakness.  Psychiatric:        Mood and Affect: Mood normal.        Behavior: Behavior normal.     BP 135/72   Pulse 68   Ht 6\' 5"  (1.956 m)   Wt 169 lb 3.2 oz (76.7 kg)   SpO2 (!) 88%   BMI 20.06 kg/m  Wt Readings from Last 3 Encounters:  04/12/24 169 lb (76.7 kg)  04/12/24 169 lb 3.2 oz (76.7 kg)  01/05/24 170 lb (77.1 kg)    Lab Results  Component Value Date   TSH 1.420 01/05/2024   Lab Results  Component Value Date   WBC 11.4 (H) 01/05/2024   HGB 12.9 (L) 01/05/2024   HCT 39.1 01/05/2024   MCV 90 01/05/2024   PLT 329 01/05/2024  Lab Results  Component Value Date   NA 141 01/05/2024   K 4.9 01/05/2024   CO2 29 01/05/2024   GLUCOSE 96 01/05/2024   BUN 34 (H) 01/05/2024   CREATININE 1.51 (H) 01/05/2024   BILITOT 0.3 01/05/2024   ALKPHOS 83 01/05/2024   AST 14 01/05/2024   ALT 8 01/05/2024   PROT 6.2 01/05/2024   ALBUMIN 4.1 01/05/2024   CALCIUM 9.3 01/05/2024   ANIONGAP 8 02/02/2023   EGFR 50 (L) 01/05/2024   Lab Results  Component Value Date   CHOL  195 01/05/2024   Lab Results  Component Value Date   HDL 54 01/05/2024   Lab Results  Component Value Date   LDLCALC 118 (H) 01/05/2024   Lab Results  Component Value Date   TRIG 127 01/05/2024   Lab Results  Component Value Date   CHOLHDL 3.6 01/05/2024   Lab Results  Component Value Date   HGBA1C 5.9 (H) 01/05/2024      Assessment & Plan:   Problem List Items Addressed This Visit       Cardiovascular and Mediastinum   Essential hypertension   BP Readings from Last 1 Encounters:  04/12/24 135/72   Well-controlled with Valsartan -HCTZ Counseled for compliance with the medications Advised DASH diet and moderate exercise/walking as tolerated        Respiratory   Chronic bronchitis (HCC)   Quit smoking in 2017 On Stiolto Albuterol  nebs and inhaler PRN Has oral steroids for acute exacerbations Follows up with Dr Waymond Hailey  Needs low-dose CT chest for lung cancer screening, but he wants to think about it. Also denies flu and pneumococcal vaccine.        Genitourinary   Stage 3b chronic kidney disease (HCC)   AKI on CKD in the past, needs to increase water  intake and cut down soft drinks On ARB and HCTZ Avoid nephrotoxic agents Reviewed US  renal from ER Checked BMP from chart, stable GFR - recheck BMP      Relevant Orders   Basic Metabolic Panel (BMET)   CBC with Differential/Platelet     Other   Restless legs syndrome - Primary   Better with Flexeril  PRN for leg cramps Continue Requip  1 mg at bedtime      Relevant Medications   rOPINIRole  (REQUIP ) 1 MG tablet   cyclobenzaprine  (FLEXERIL ) 10 MG tablet   Lipoma of right axilla   Stable in size Not painful General surgery evaluation done in 01/22, but he did not want it removed and still denies      Hordeolum externum of left upper eyelid   Advised to apply warm compresses If persistent, will prescribe erythromycin ointment      Other Visit Diagnoses       Muscle spasms of both lower  extremities       Relevant Medications   cyclobenzaprine  (FLEXERIL ) 10 MG tablet        Meds ordered this encounter  Medications   rOPINIRole  (REQUIP ) 1 MG tablet    Sig: Take 1 tablet (1 mg total) by mouth at bedtime.    Dispense:  90 tablet    Refill:  1   cyclobenzaprine  (FLEXERIL ) 10 MG tablet    Sig: TAKE 1 TABLET BY MOUTH TWICE A DAY AS NEEDED FOR MUSCLE SPASMS    Dispense:  60 tablet    Refill:  3    Follow-up: Return in about 5 months (around 09/12/2024) for HTN and CKD.    Meldon Sport, MD

## 2024-04-12 NOTE — Patient Instructions (Addendum)
 Plan A = Automatic = Always=    Stiolto 2 puffs each am  Ohtuvayre  one twice daily per nebulizer   Plan B = Backup (to supplement plan A, not to replace it) Only use your albuterol  inhaler as a rescue medication to be used if you can't catch your breath by resting or doing a relaxed purse lip breathing pattern.  - The less you use it, the better it will work when you need it. - Ok to use the inhaler up to 2 puffs  every 4 hours if you must but call for appointment if use goes up over your usual need - Don't leave home without it !!  (think of it like the spare tire for your car)   Plan C = Crisis (instead of Plan B but only if Plan B stops working) - only use your albuterol  nebulizer if you first try Plan B and it fails to help > ok to use the nebulizer up to every 4 hours but if start needing it regularly call for immediate appointment  Also  Ok to try albuterol  15 min before an activity (on alternating days with inhaler then next day use the nebulizer )  that you know would usually make you short of breath and see if it makes any difference and if makes none then don't take albuterol  after activity unless you can't catch your breath as this means it's the resting that helps, not the albuterol .       Plan D = when ABC not working  Prednisone  take 2 daily until better then 1 daily x  5 days and stop   Please schedule a follow up visit in 6 weeks but call sooner if needed  with all medications /inhalers/ solutions in hand so we can verify exactly what you are taking. This includes all medications from all doctors and over the counters

## 2024-04-13 LAB — CBC WITH DIFFERENTIAL/PLATELET
Basophils Absolute: 0.1 10*3/uL (ref 0.0–0.2)
Basos: 1 %
EOS (ABSOLUTE): 0.3 10*3/uL (ref 0.0–0.4)
Eos: 3 %
Hematocrit: 44 % (ref 37.5–51.0)
Hemoglobin: 14.5 g/dL (ref 13.0–17.7)
Immature Grans (Abs): 0 10*3/uL (ref 0.0–0.1)
Immature Granulocytes: 0 %
Lymphocytes Absolute: 2.1 10*3/uL (ref 0.7–3.1)
Lymphs: 20 %
MCH: 29.2 pg (ref 26.6–33.0)
MCHC: 33 g/dL (ref 31.5–35.7)
MCV: 89 fL (ref 79–97)
Monocytes Absolute: 0.7 10*3/uL (ref 0.1–0.9)
Monocytes: 6 %
Neutrophils Absolute: 7.5 10*3/uL — ABNORMAL HIGH (ref 1.4–7.0)
Neutrophils: 70 %
Platelets: 283 10*3/uL (ref 150–450)
RBC: 4.97 x10E6/uL (ref 4.14–5.80)
RDW: 12.4 % (ref 11.6–15.4)
WBC: 10.6 10*3/uL (ref 3.4–10.8)

## 2024-04-13 LAB — BASIC METABOLIC PANEL WITH GFR
BUN/Creatinine Ratio: 15 (ref 10–24)
BUN: 28 mg/dL — ABNORMAL HIGH (ref 8–27)
CO2: 26 mmol/L (ref 20–29)
Calcium: 9.8 mg/dL (ref 8.6–10.2)
Chloride: 95 mmol/L — ABNORMAL LOW (ref 96–106)
Creatinine, Ser: 1.86 mg/dL — ABNORMAL HIGH (ref 0.76–1.27)
Glucose: 85 mg/dL (ref 70–99)
Potassium: 4.8 mmol/L (ref 3.5–5.2)
Sodium: 138 mmol/L (ref 134–144)
eGFR: 38 mL/min/{1.73_m2} — ABNORMAL LOW (ref >59)

## 2024-04-16 ENCOUNTER — Telehealth: Payer: Self-pay | Admitting: Internal Medicine

## 2024-04-16 NOTE — Telephone Encounter (Signed)
 Patient came by office to see nurse or Dr Lydia Sams, patient left could not wait.(Teams message was sent)  Patient had some type of drink with him wanted to know if okay to drink instead of water ?   Patient is back in the lobby

## 2024-04-16 NOTE — Telephone Encounter (Signed)
 Spoke to pt to relay message from pcp.

## 2024-04-24 ENCOUNTER — Other Ambulatory Visit: Payer: Self-pay | Admitting: Internal Medicine

## 2024-04-28 ENCOUNTER — Other Ambulatory Visit: Payer: Self-pay | Admitting: Internal Medicine

## 2024-04-30 ENCOUNTER — Other Ambulatory Visit: Payer: Self-pay | Admitting: Internal Medicine

## 2024-05-02 ENCOUNTER — Telehealth: Payer: Self-pay | Admitting: Internal Medicine

## 2024-05-02 NOTE — Telephone Encounter (Signed)
 Patient was a walk in today and wanted to let Dr. Waymond Hailey know that he cannot use the nebulizer that he prescribed for him.  Per patient-you have to go online and I do not have a computer,.  Patient just wanted to let Dr. Waymond Hailey know.  Patient call back---[phone] if you have questions

## 2024-05-02 NOTE — Telephone Encounter (Signed)
 Spoke with rep at [employer] - he states they do not show anything outstanding on their end or any reason patient would need access to a computer to go online.   Spoke with patient and he states he thought the paperwork said he needed to go online to complete something. Advised him we have faxed everything in for him and there is nothing additional he needs to do. He also mentioned he does not know how to operate the nebulizer machine they sent him. Asked if he could bring it to the office and he states he doesn't know when he will be back in town, but he does have OV scheduled for early June and he will bring it then. Nothing further needed at this time.
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
## 2024-05-24 ENCOUNTER — Ambulatory Visit: Admitting: Internal Medicine

## 2024-06-04 ENCOUNTER — Ambulatory Visit: Admitting: Internal Medicine
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
## 2024-07-10 ENCOUNTER — Emergency Department (HOSPITAL_COMMUNITY)
Admission: EM | Admit: 2024-07-10 | Discharge: 2024-07-10 | Discharge status: Against Medical Advice | Source: Other Acute Inpatient Hospital | Attending: Emergency Medicine | Admitting: Emergency Medicine

## 2024-07-10 ENCOUNTER — Encounter: Payer: Self-pay | Admitting: Internal Medicine

## 2024-07-10 ENCOUNTER — Ambulatory Visit: Admitting: Internal Medicine

## 2024-07-10 ENCOUNTER — Emergency Department (HOSPITAL_COMMUNITY)

## 2024-07-10 ENCOUNTER — Encounter (HOSPITAL_COMMUNITY): Payer: Self-pay | Admitting: Emergency Medicine

## 2024-07-10 ENCOUNTER — Other Ambulatory Visit: Payer: Self-pay

## 2024-07-10 DIAGNOSIS — Z5329 Procedure and treatment not carried out because of patient's decision for other reasons: Secondary | ICD-10-CM | POA: Insufficient documentation

## 2024-07-10 DIAGNOSIS — I129 Hypertensive chronic kidney disease with stage 1 through stage 4 chronic kidney disease, or unspecified chronic kidney disease: Secondary | ICD-10-CM | POA: Diagnosis not present

## 2024-07-10 DIAGNOSIS — J439 Emphysema, unspecified: Secondary | ICD-10-CM | POA: Diagnosis not present

## 2024-07-10 DIAGNOSIS — R000 Tachycardia, unspecified: Secondary | ICD-10-CM | POA: Diagnosis not present

## 2024-07-10 DIAGNOSIS — R0602 Shortness of breath: Secondary | ICD-10-CM | POA: Diagnosis not present

## 2024-07-10 DIAGNOSIS — J441 Chronic obstructive pulmonary disease with (acute) exacerbation: Secondary | ICD-10-CM | POA: Insufficient documentation

## 2024-07-10 DIAGNOSIS — J40 Bronchitis, not specified as acute or chronic: Secondary | ICD-10-CM | POA: Diagnosis not present

## 2024-07-10 DIAGNOSIS — Z7951 Long term (current) use of inhaled steroids: Secondary | ICD-10-CM | POA: Insufficient documentation

## 2024-07-10 DIAGNOSIS — R0781 Pleurodynia: Secondary | ICD-10-CM | POA: Diagnosis not present

## 2024-07-10 DIAGNOSIS — N183 Chronic kidney disease, stage 3 unspecified: Secondary | ICD-10-CM | POA: Insufficient documentation

## 2024-07-10 DIAGNOSIS — Z79899 Other long term (current) drug therapy: Secondary | ICD-10-CM | POA: Insufficient documentation

## 2024-07-10 DIAGNOSIS — N289 Disorder of kidney and ureter, unspecified: Secondary | ICD-10-CM | POA: Diagnosis not present

## 2024-07-10 DIAGNOSIS — I499 Cardiac arrhythmia, unspecified: Secondary | ICD-10-CM | POA: Diagnosis not present

## 2024-07-10 DIAGNOSIS — R0689 Other abnormalities of breathing: Secondary | ICD-10-CM | POA: Diagnosis not present

## 2024-07-10 DIAGNOSIS — R069 Unspecified abnormalities of breathing: Secondary | ICD-10-CM | POA: Diagnosis not present

## 2024-07-10 LAB — URINALYSIS, ROUTINE W REFLEX MICROSCOPIC
Bilirubin Urine: NEGATIVE
Glucose, UA: NEGATIVE mg/dL
Ketones, ur: 5 mg/dL — AB
Nitrite: NEGATIVE
Protein, ur: 100 mg/dL — AB
Specific Gravity, Urine: 1.018 (ref 1.005–1.030)
WBC, UA: >50 WBC/hpf (ref 0–5)
pH: 5 (ref 5.0–8.0)

## 2024-07-10 LAB — TROPONIN I (HIGH SENSITIVITY): Troponin I (High Sensitivity): 11 ng/L (ref <18)

## 2024-07-10 LAB — CBC
HCT: 41.8 % (ref 39.0–52.0)
Hemoglobin: 13.8 g/dL (ref 13.0–17.0)
MCH: 29.8 pg (ref 26.0–34.0)
MCHC: 33 g/dL (ref 30.0–36.0)
MCV: 90.3 fL (ref 80.0–100.0)
Platelets: 171 K/uL (ref 150–400)
RBC: 4.63 MIL/uL (ref 4.22–5.81)
RDW: 13.1 % (ref 11.5–15.5)
WBC: 18.8 K/uL — ABNORMAL HIGH (ref 4.0–10.5)
nRBC: 0 % (ref 0.0–0.2)

## 2024-07-10 LAB — BASIC METABOLIC PANEL WITH GFR
Anion gap: 16 — ABNORMAL HIGH (ref 5–15)
BUN: 35 mg/dL — ABNORMAL HIGH (ref 8–23)
CO2: 26 mmol/L (ref 22–32)
Calcium: 8.7 mg/dL — ABNORMAL LOW (ref 8.9–10.3)
Chloride: 91 mmol/L — ABNORMAL LOW (ref 98–111)
Creatinine, Ser: 2.07 mg/dL — ABNORMAL HIGH (ref 0.61–1.24)
GFR, Estimated: 34 mL/min — ABNORMAL LOW (ref >60)
Glucose, Bld: 129 mg/dL — ABNORMAL HIGH (ref 70–99)
Potassium: 4 mmol/L (ref 3.5–5.1)
Sodium: 133 mmol/L — ABNORMAL LOW (ref 135–145)

## 2024-07-10 MED ORDER — PREDNISONE 50 MG PO TABS: ORAL_TABLET | ORAL | 0 refills | Status: DC

## 2024-07-10 MED ORDER — ALBUTEROL SULFATE (2.5 MG/3ML) 0.083% IN NEBU
10.0000 mg/h | INHALATION_SOLUTION | Freq: Once | RESPIRATORY_TRACT | Status: AC
  Administered 2024-07-10: 10 mg/h via RESPIRATORY_TRACT
  Filled 2024-07-10: qty 12

## 2024-07-10 MED ORDER — ONDANSETRON HCL 4 MG/2ML IJ SOLN
4.0000 mg | Freq: Once | INTRAMUSCULAR | Status: DC
Start: 2024-07-10 — End: 2024-07-10
  Filled 2024-07-10: qty 2

## 2024-07-10 MED ORDER — SODIUM CHLORIDE 0.9 % IV BOLUS
1000.0000 mL | Freq: Once | INTRAVENOUS | Status: AC
  Administered 2024-07-10: 1000 mL via INTRAVENOUS

## 2024-07-10 NOTE — ED Triage Notes (Signed)
 Pt arrives via EMS from [facility] with COPD exac and SOB for a few days that worsened. EMS heard wheezing, O2 95% RA. Also reports he has been trying to urinate multiple times since last night and very little output. PT given duoneb, decadron at [facility]. EMS gave 5 albutertol and 125 solu-medrol .
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
## 2024-07-10 NOTE — Discharge Instructions (Signed)
 You have receiving big doses of steroids today through your IV, so do not take your prednisone  50 mg tablet until tomorrow.  After you have finished this 5 day course,  return to your 10 mg daily tablets.    Also, you will need to call Dr Leisa office in the morning to get help with your nebulizer machine.  Call him also for a close office evaluation.  Of course,  return here if you change your mind about getting admitted,  especially if your symptoms worsen.

## 2024-07-10 NOTE — ED Notes (Signed)
 Ambulated pt and oxygen  was at 96%.  Pt respirations were 24 with pursed lip breathing

## 2024-07-17 ENCOUNTER — Other Ambulatory Visit: Payer: Self-pay | Admitting: Internal Medicine

## 2024-07-19 ENCOUNTER — Inpatient Hospital Stay: Payer: Self-pay | Admitting: Nurse Practitioner

## 2024-07-25 DIAGNOSIS — N3001 Acute cystitis with hematuria: Secondary | ICD-10-CM | POA: Diagnosis not present

## 2024-07-25 DIAGNOSIS — R300 Dysuria: Secondary | ICD-10-CM | POA: Diagnosis not present

## 2024-07-31 DIAGNOSIS — J069 Acute upper respiratory infection, unspecified: Secondary | ICD-10-CM | POA: Diagnosis not present

## 2024-08-30 ENCOUNTER — Encounter: Payer: Self-pay | Admitting: Internal Medicine

## 2024-08-30 ENCOUNTER — Ambulatory Visit (INDEPENDENT_AMBULATORY_CARE_PROVIDER_SITE_OTHER): Payer: Self-pay | Admitting: Internal Medicine

## 2024-08-30 ENCOUNTER — Ambulatory Visit: Admitting: Internal Medicine

## 2024-08-30 VITALS — BP 114/69 | HR 83 | Ht 77.0 in | Wt 174.0 lb

## 2024-08-30 VITALS — BP 129/80 | HR 82 | Ht 77.0 in | Wt 173.8 lb

## 2024-08-30 DIAGNOSIS — J42 Unspecified chronic bronchitis: Secondary | ICD-10-CM

## 2024-08-30 DIAGNOSIS — N1832 Chronic kidney disease, stage 3b: Secondary | ICD-10-CM | POA: Diagnosis not present

## 2024-08-30 DIAGNOSIS — Z87891 Personal history of nicotine dependence: Secondary | ICD-10-CM

## 2024-08-30 DIAGNOSIS — I10 Essential (primary) hypertension: Secondary | ICD-10-CM | POA: Diagnosis not present

## 2024-08-30 DIAGNOSIS — G2581 Restless legs syndrome: Secondary | ICD-10-CM

## 2024-08-30 DIAGNOSIS — R0609 Other forms of dyspnea: Secondary | ICD-10-CM | POA: Diagnosis not present

## 2024-08-30 DIAGNOSIS — J449 Chronic obstructive pulmonary disease, unspecified: Secondary | ICD-10-CM

## 2024-08-30 MED ORDER — PREDNISONE 10 MG PO TABS: ORAL_TABLET | ORAL | 1 refills | Status: DC

## 2024-08-30 NOTE — Assessment & Plan Note (Signed)
 AKI on CKD in the past, needs to increase water  intake and cut down soft drinks On ARB and HCTZ Avoid nephrotoxic agents Reviewed US  renal from ER Checked BMP from chart, stable GFR - recheck BMP

## 2024-08-30 NOTE — Progress Notes (Signed)
 Established Patient Office Visit  Subjective:  Patient ID: Scott Hubbard, male    DOB: Dec 01, 1956  Age: 68 y.o. MRN: [id number]  CC:  Chief Complaint  Patient presents with   Follow-up    Hospital f/u     HPI Scott Hubbard is a 68 y.o. male with past medical history of COPD, hypertension, CKD and previous tobacco abuse who presents for follow-up after recent urgent care visit on 07/31/24.  He went to urgent care on 07/25/24 for dysuria, urinary retention and suprapubic pain, was told of UTI, was given Augmentin .  He denies dysuria, hematuria, or abdominal pain today.  Denies any episode of fever or chills recently.  He went to urgent care again on 07/31/24 for complaint of nasal congestion, and worsening of cough and dyspnea with wheezing.  He was given doxycycline  for URTI.  He was also seen by pulmonology today.  He reports improvement in the symptoms currently.  HTN: BP is well-controlled. Takes medications regularly. Patient denies headache, dizziness, or palpitations.   COPD: He complains of chronic cough and dyspnea, for which he uses Stiolto and as needed albuterol  neb and inhaler.  Followed by Dr. Darlean.  He has oral steroids for acute exacerbations.   CKD: His S. Cr. and GFR had been improving in the last BMP.  He currently denies any dysuria or hematuria.  He reports that he has stopped drinking water  and instead has been taking diet pepsi.   He complains of chronic leg cramps and forearm cramps, for which he is taking Flexeril  now.  He has seen improvement in his leg cramps, but still has mild cramps in his UE.  He is also taking ropinirole  1 mg.  Past Medical History:  Diagnosis Date   Chest discomfort    negative stress echocardiogram-01/2011   Chronic bronchitis    CKD (chronic kidney disease)    COPD (chronic obstructive pulmonary disease) (HCC)    DJD (degenerative joint disease)    Gastro-esophageal reflux disease with esophagitis    GERD (gastroesophageal reflux  disease)    Hyperlipidemia    Hypertension    Migraine    PVC (premature ventricular contraction)    Frequent   Tobacco abuse    40 pack years    Past Surgical History:  Procedure Laterality Date   COLONOSCOPY WITH PROPOFOL  N/A 02/04/2023   Cindie Carlin POUR, DO; inadequate prep   COLONOSCOPY WITH PROPOFOL  N/A 04/29/2023   Procedure: COLONOSCOPY WITH PROPOFOL ;  Surgeon: Cindie Carlin POUR, DO;  Location: [facility];  Service: Endoscopy;  Laterality: N/A;  1:15 pm, ASA 3   POLYPECTOMY  04/29/2023   Procedure: POLYPECTOMY;  Surgeon: Cindie Carlin POUR, DO;  Location: [facility];  Service: Endoscopy;;    Family History  Problem Relation Age of Onset   Colon cancer Neg Hx     Social History   Socioeconomic History   Marital status: Legally Separated    Spouse name: Not on file   Number of children: 2   Years of education: Not on file   Highest education level: Not on file  Occupational History   Occupation: unemployed    Employer: UNEMPLOYED  Tobacco Use   Smoking status: Former    Current packs/day: 0.00    Average packs/day: 1.5 packs/day for 40.0 years (60.0 ttl pk-yrs)    Types: Cigarettes    Start date: 04/27/1976    Quit date: 04/27/2016    Years since quitting: 8.3   Smokeless tobacco:  Never  Vaping Use   Vaping status: Never Used  Substance and Sexual Activity   Alcohol use: No   Drug use: No   Sexual activity: Not on file  Other Topics Concern   Not on file  Social History Narrative   Lives with girlfriend Scott Hubbard  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     Social Drivers of Corporate investment banker Strain: Low Risk  (04/12/2023)   Overall Financial Resource Strain (CARDIA)    Difficulty of Paying Living Expenses: Not hard at all  Food Insecurity: No Food Insecurity (04/12/2023)   Hunger Vital Sign    Worried About Running Out of Food in the Last Year: Never true    Ran Out of Food in the Last Year: Never true  Transportation Needs: No Transportation  Needs (04/12/2023)   PRAPARE - Administrator, Civil Service (Medical): No    Lack of Transportation (Non-Medical): No  Physical Activity: Sufficiently Active (09/06/2022)   Exercise Vital Sign    Days of Exercise per Week: 5 days    Minutes of Exercise per Session: 30 min  Stress: No Stress Concern Present (04/12/2023)   [employer] of Occupational Health - Occupational Stress Questionnaire    Feeling of Stress : Only a little  Social Connections: Moderately Integrated (09/06/2022)   Social Connection and Isolation Panel    Frequency of Communication with Friends and Family: More than three times a week    Frequency of Social Gatherings with Friends and Family: Twice a week    Attends Religious Services: 1 to 4 times per year    Active Member of [employer] or Organizations: No    Attends Banker Meetings: Never    Marital Status: Living with partner  Intimate Partner Violence: Not At Risk (09/06/2022)   Humiliation, Afraid, Rape, and Kick questionnaire    Fear of Current or Ex-Partner: No    Emotionally Abused: No    Physically Abused: No    Sexually Abused: No    Outpatient Medications Prior to Visit  Medication Sig Dispense Refill   albuterol  (PROVENTIL ) (2.5 MG/3ML) 0.083% nebulizer solution Take 3 mLs (2.5 mg total) by nebulization every 4 (four) hours as needed for wheezing or shortness of breath. 75 mL 12   albuterol  (VENTOLIN  HFA) 108 (90 Base) MCG/ACT inhaler USE 2 PUFFS EVERY 4 HOURS AS NEEDED ONLY IF YOUR CAN'T CATCH YOUR BREATH 18 each 6   beclomethasone (QVAR  REDIHALER) 80 MCG/ACT inhaler Take 2 puffs first thing in am and then another 2 puffs about 12 hours later.     cyclobenzaprine  (FLEXERIL ) 10 MG tablet TAKE 1 TABLET BY MOUTH TWICE A DAY AS NEEDED FOR MUSCLE SPASMS 60 tablet 3   Ensifentrine  (OHTUVAYRE ) 3 MG/2.5ML SUSP Inhale 2.5 mLs into the lungs 2 (two) times daily.     famotidine  (PEPCID ) 20 MG tablet TAKE 1 TABLET (20 MG TOTAL) BY MOUTH  DAILY AFTER SUPPER. 90 tablet 1   pantoprazole  (PROTONIX ) 40 MG tablet TAKE 1 TABLET (40 MG TOTAL) BY MOUTH DAILY. TAKE 30-60 MIN BEFORE FIRST MEAL OF THE DAY 90 tablet 3   predniSONE  (DELTASONE ) 10 MG tablet TAKE 2 TABLETS WITH BREAKFAST UNTIL BETTER THEN 1 DAILY X 5 DAYS AND STOP 100 tablet 1   rOPINIRole  (REQUIP ) 1 MG tablet Take 1 tablet (1 mg total) by mouth at bedtime. 90 tablet 1   Tiotropium Bromide-Olodaterol (STIOLTO RESPIMAT ) 2.5-2.5 MCG/ACT AERS INHALE 2 PUFFS BY MOUTH INTO THE LUNGS  DAILY 4 g 11   valsartan -hydrochlorothiazide  (DIOVAN -HCT) 80-12.5 MG tablet TAKE 1 TABLET BY MOUTH EVERY DAY 90 tablet 3   No facility-administered medications prior to visit.    No Known Allergies  ROS Review of Systems  Constitutional:  Negative for chills and fever.  HENT:  Negative for sinus pressure, sinus pain and sore throat.   Eyes:  Negative for pain and discharge.  Respiratory:  Positive for cough, shortness of breath (chronic) and wheezing.   Cardiovascular:  Negative for palpitations and leg swelling.  Gastrointestinal:  Negative for constipation, diarrhea, nausea and vomiting.  Endocrine: Negative for polydipsia and polyuria.  Genitourinary:  Negative for dysuria and hematuria.  Musculoskeletal:  Negative for neck pain and neck stiffness.  Skin:  Positive for rash (b/l UE).  Neurological:  Negative for dizziness, weakness, numbness and headaches.  Psychiatric/Behavioral:  Negative for agitation and behavioral problems.       Objective:    Physical Exam Vitals reviewed.  Constitutional:      General: He is not in acute distress.    Appearance: He is not diaphoretic.  HENT:     Head: Normocephalic and atraumatic.     Mouth/Throat:     Mouth: Mucous membranes are moist.  Eyes:     General: No scleral icterus.    Extraocular Movements: Extraocular movements intact.  Cardiovascular:     Rate and Rhythm: Normal rate and regular rhythm.     Heart sounds: Normal heart sounds.  No murmur heard. Pulmonary:     Breath sounds: No wheezing or rales.  Abdominal:     General: Bowel sounds are normal.     Palpations: Abdomen is soft.     Tenderness: There is no abdominal tenderness.     Hernia: A hernia (Umbilical) is present.  Musculoskeletal:        General: No swelling or signs of injury.     Cervical back: Neck supple. No tenderness.     Left hip: Tenderness present. Normal range of motion.     Right lower leg: No edema.     Left lower leg: No edema.     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     Findings: Rash (Erythematous plaques over b/l UE) present.     Comments: Herpetic wart over left palm About 8 cm in diameter lipoma in right axillary area  Neurological:     General: No focal deficit present.     Mental Status: He is alert and oriented to person, place, and time.     Sensory: No sensory deficit.     Motor: No weakness.  Psychiatric:        Mood and Affect: Mood normal.        Behavior: Behavior normal.     BP 129/80   Pulse 82   Ht 6' 5 (1.956 m)   Wt 173 lb 12.8 oz (78.8 kg)   SpO2 96%   BMI 20.61 kg/m  Wt Readings from Last 3 Encounters:  08/30/24 173 lb 12.8 oz (78.8 kg)  08/30/24 174 lb (78.9 kg)  07/10/24 169 lb 12.1 oz (77 kg)    Lab Results  Component Value Date   TSH 1.420 01/05/2024   Lab Results  Component Value Date   WBC 18.8 (H) 07/10/2024   HGB 13.8 07/10/2024   HCT 41.8 07/10/2024   MCV 90.3 07/10/2024   PLT 171 07/10/2024  Lab Results  Component Value Date   NA 133 (L) 07/10/2024   K 4.0 07/10/2024   CO2 26 07/10/2024   GLUCOSE 129 (H) 07/10/2024   BUN 35 (H) 07/10/2024   CREATININE 2.07 (H) 07/10/2024   BILITOT 0.3 01/05/2024   ALKPHOS 83 01/05/2024   AST 14 01/05/2024   ALT 8 01/05/2024   PROT 6.2 01/05/2024   ALBUMIN 4.1 01/05/2024   CALCIUM 8.7 (L) 07/10/2024   ANIONGAP 16 (H) 07/10/2024   EGFR 38 (L) 04/12/2024   Lab Results   Component Value Date   CHOL 195 01/05/2024   Lab Results  Component Value Date   HDL 54 01/05/2024   Lab Results  Component Value Date   LDLCALC 118 (H) 01/05/2024   Lab Results  Component Value Date   TRIG 127 01/05/2024   Lab Results  Component Value Date   CHOLHDL 3.6 01/05/2024   Lab Results  Component Value Date   HGBA1C 5.9 (H) 01/05/2024      Assessment & Plan:   Problem List Items Addressed This Visit       Cardiovascular and Mediastinum   Essential hypertension   BP Readings from Last 1 Encounters:  08/30/24 129/80   Well-controlled with Valsartan -HCTZ Counseled for compliance with the medications Advised DASH diet and moderate exercise/walking as tolerated        Respiratory   Chronic bronchitis (HCC) - Primary   Quit smoking in 2017 On Stiolto Albuterol  nebs and inhaler PRN Has oral steroids for acute exacerbations Follows up with Dr Darlean  Needs low-dose CT chest for lung cancer screening, but he wants to think about it. Also denies flu and pneumococcal vaccine.        Genitourinary   Stage 3b chronic kidney disease (HCC)   AKI on CKD in the past, needs to increase water  intake and cut down soft drinks On ARB and HCTZ Avoid nephrotoxic agents Reviewed US  renal from ER Checked BMP from chart, stable GFR - recheck BMP        Other   Restless legs syndrome   Better with Flexeril  PRN for leg cramps Continue Requip  1 mg at bedtime       No orders of the defined types were placed in this encounter.   Follow-up: Return if symptoms worsen or fail to improve.    Suzzane MARLA Blanch, MD
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
## 2024-08-30 NOTE — Assessment & Plan Note (Signed)
 BP Readings from Last 1 Encounters:  08/30/24 129/80   Well-controlled with Valsartan -HCTZ Counseled for compliance with the medications Advised DASH diet and moderate exercise/walking as tolerated

## 2024-08-30 NOTE — Patient Instructions (Addendum)
 Please continue to take medications as prescribed.  Please continue to follow low salt diet and ambulate as tolerated.  Please maintain at least 64 ounces of fluid intake in a day.

## 2024-08-30 NOTE — Assessment & Plan Note (Addendum)
 04/09/2020   Walked RA x two laps =  approx 269ft @ slow pace - stopped due to sob with sats of 97  % at the end of the study  -  12/15/2020   [facility] RA  approx   450 ft  @ avg pace  stopped due to  Tired, sob with sats 95%   -  Referred to pulmonary rehab at [facility] 12/15/2020 > declined - 08/30/2024   Walked on RA  x  2 lap(s) =  approx 300  ft  @ slow pace, stopped due to sob  with lowest 02 sats 88%   Eligible for portable 02 for exertion but declined for now and willing give the Ohtuvayre  a chance to see if corrects the doe/ ex hypoxemia          Each maintenance medication was reviewed in detail including emphasizing most importantly the difference between maintenance and prns and under what circumstances the prns are to be triggered using an action plan format where appropriate.  Total time for H and P, chart review, counseling, reviewing smi/ hfa/ neb device(s) and generating customized AVS unique to this office visit / same day charting =  35 min
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
## 2024-08-30 NOTE — Patient Instructions (Signed)
 No change medication   We will the ohtuvayre  team call you to get your nebulizer set up and start twice daily   Remember: Use your albuterol  as a rescue medication to be used if you can't catch your breath by resting, slowing your pace,  or doing a relaxed purse lip breathing pattern.  - The less you use it, the better it will work when you need it. - Ok to use up to 2 puffs  every 4 hours if you must but call for  appointment if use goes up over your usual need - Don't leave home without it !!  (think of it like a spare tire or starter fluid for your car)   Also  Ok to try albuterol  15 min before an activity (on alternating days with the nebulizer)  that you know would usually make you short of breath and see if it makes any difference and if makes none then don't take albuterol  after activity unless you can't catch your breath as this means it's the resting that helps, not the albuterol .  Please schedule a follow up office visit in 6 weeks, call sooner if needed with all medications /inhalers/ solutions in hand so we can verify exactly what you are taking. This includes all medications from all doctors and over the counters

## 2024-08-30 NOTE — Assessment & Plan Note (Signed)
 Quit smoking in 2017 On Stiolto Albuterol nebs and inhaler PRN Has oral steroids for acute exacerbations Follows up with Dr Sherene Sires  Needs low-dose CT chest for lung cancer screening, but he wants to think about it. Also denies flu and pneumococcal vaccine.

## 2024-08-30 NOTE — Assessment & Plan Note (Signed)
 Better with Flexeril  PRN for leg cramps Continue Requip  1 mg at bedtime

## 2024-09-03 ENCOUNTER — Other Ambulatory Visit: Payer: Self-pay

## 2024-09-03 ENCOUNTER — Emergency Department (HOSPITAL_COMMUNITY)

## 2024-09-03 ENCOUNTER — Encounter (HOSPITAL_COMMUNITY): Payer: Self-pay | Admitting: Emergency Medicine

## 2024-09-03 ENCOUNTER — Emergency Department (HOSPITAL_COMMUNITY)
Admission: EM | Admit: 2024-09-03 | Discharge: 2024-09-03 | Disposition: A | Discharge status: Home / Self Care | Attending: Emergency Medicine | Admitting: Emergency Medicine

## 2024-09-03 DIAGNOSIS — N451 Epididymitis: Secondary | ICD-10-CM | POA: Insufficient documentation

## 2024-09-03 DIAGNOSIS — D72829 Elevated white blood cell count, unspecified: Secondary | ICD-10-CM | POA: Diagnosis not present

## 2024-09-03 DIAGNOSIS — E871 Hypo-osmolality and hyponatremia: Secondary | ICD-10-CM | POA: Diagnosis not present

## 2024-09-03 DIAGNOSIS — R319 Hematuria, unspecified: Secondary | ICD-10-CM | POA: Diagnosis not present

## 2024-09-03 DIAGNOSIS — R8290 Unspecified abnormal findings in urine: Secondary | ICD-10-CM | POA: Diagnosis not present

## 2024-09-03 DIAGNOSIS — N50812 Left testicular pain: Secondary | ICD-10-CM | POA: Diagnosis present

## 2024-09-03 LAB — CBC WITH DIFFERENTIAL/PLATELET
Abs Immature Granulocytes: 0.14 K/uL — ABNORMAL HIGH (ref 0.00–0.07)
Basophils Absolute: 0 K/uL (ref 0.0–0.1)
Basophils Relative: 0 %
Eosinophils Absolute: 0.1 K/uL (ref 0.0–0.5)
Eosinophils Relative: 0 %
HCT: 40.4 % (ref 39.0–52.0)
Hemoglobin: 12.6 g/dL — ABNORMAL LOW (ref 13.0–17.0)
Immature Granulocytes: 1 %
Lymphocytes Relative: 9 %
Lymphs Abs: 1.3 K/uL (ref 0.7–4.0)
MCH: 28.8 pg (ref 26.0–34.0)
MCHC: 31.2 g/dL (ref 30.0–36.0)
MCV: 92.2 fL (ref 80.0–100.0)
Monocytes Absolute: 1.2 K/uL — ABNORMAL HIGH (ref 0.1–1.0)
Monocytes Relative: 8 %
Neutro Abs: 12 K/uL — ABNORMAL HIGH (ref 1.7–7.7)
Neutrophils Relative %: 82 %
Platelets: 227 K/uL (ref 150–400)
RBC: 4.38 MIL/uL (ref 4.22–5.81)
RDW: 14.8 % (ref 11.5–15.5)
WBC: 14.7 K/uL — ABNORMAL HIGH (ref 4.0–10.5)
nRBC: 0 % (ref 0.0–0.2)

## 2024-09-03 LAB — URINALYSIS, ROUTINE W REFLEX MICROSCOPIC
Bilirubin Urine: NEGATIVE
Glucose, UA: NEGATIVE mg/dL
Ketones, ur: NEGATIVE mg/dL
Nitrite: NEGATIVE
Protein, ur: 30 mg/dL — AB
Specific Gravity, Urine: 1.017 (ref 1.005–1.030)
WBC, UA: >50 WBC/hpf (ref 0–5)
pH: 5 (ref 5.0–8.0)

## 2024-09-03 LAB — COMPREHENSIVE METABOLIC PANEL WITH GFR
ALT: 9 U/L (ref 0–44)
AST: 17 U/L (ref 15–41)
Albumin: 3.5 g/dL (ref 3.5–5.0)
Alkaline Phosphatase: 64 U/L (ref 38–126)
Anion gap: 15 (ref 5–15)
BUN: 29 mg/dL — ABNORMAL HIGH (ref 8–23)
CO2: 24 mmol/L (ref 22–32)
Calcium: 8.6 mg/dL — ABNORMAL LOW (ref 8.9–10.3)
Chloride: 94 mmol/L — ABNORMAL LOW (ref 98–111)
Creatinine, Ser: 1.72 mg/dL — ABNORMAL HIGH (ref 0.61–1.24)
GFR, Estimated: 42 mL/min — ABNORMAL LOW (ref >60)
Glucose, Bld: 102 mg/dL — ABNORMAL HIGH (ref 70–99)
Potassium: 4 mmol/L (ref 3.5–5.1)
Sodium: 133 mmol/L — ABNORMAL LOW (ref 135–145)
Total Bilirubin: 1.6 mg/dL — ABNORMAL HIGH (ref 0.0–1.2)
Total Protein: 7 g/dL (ref 6.5–8.1)

## 2024-09-03 MED ORDER — OXYCODONE-ACETAMINOPHEN 5-325 MG PO TABS
1.0000 | ORAL_TABLET | Freq: Once | ORAL | Status: AC
  Administered 2024-09-03: 1 via ORAL
  Filled 2024-09-03: qty 1

## 2024-09-03 MED ORDER — HYDROCODONE-ACETAMINOPHEN 5-325 MG PO TABS: 1.0000 | ORAL_TABLET | Freq: Four times a day (QID) | ORAL | 0 refills | Status: AC | PRN

## 2024-09-03 MED ORDER — MORPHINE SULFATE (PF) 4 MG/ML IV SOLN
4.0000 mg | Freq: Once | INTRAVENOUS | Status: AC
  Administered 2024-09-03: 4 mg via INTRAVENOUS
  Filled 2024-09-03: qty 1

## 2024-09-03 MED ORDER — ONDANSETRON HCL 4 MG/2ML IJ SOLN
4.0000 mg | Freq: Once | INTRAMUSCULAR | Status: AC
Start: 2024-09-03 — End: 2024-09-03
  Administered 2024-09-03: 4 mg via INTRAVENOUS
  Filled 2024-09-03: qty 2

## 2024-09-03 MED ORDER — LEVOFLOXACIN 250 MG PO TABS: 250.0000 mg | ORAL_TABLET | Freq: Every day | ORAL | 0 refills | Status: AC

## 2024-09-03 MED ORDER — LEVOFLOXACIN 500 MG PO TABS
500.0000 mg | ORAL_TABLET | Freq: Once | ORAL | Status: AC
  Administered 2024-09-03: 500 mg via ORAL
  Filled 2024-09-03: qty 1

## 2024-09-03 NOTE — Discharge Instructions (Signed)
 Please take all antibiotics as directed.  You do not need to start taking the antibiotics you are prescribed until tomorrow.  You already received today's dose.  Please call to make an appointment to follow-up closely with urology on an outpatient basis.  Return to emergency department immediately for any new or worsening symptoms.

## 2024-09-03 NOTE — ED Triage Notes (Signed)
 Pt in by POV for left testicular pain since 1500 yesterday. States he had blood in his urine twice but now no more urinary symptoms.

## 2024-09-03 NOTE — ED Provider Notes (Signed)
 [place] EMERGENCY DEPARTMENT AT [facility] Provider Note   CSN: [id number] Arrival date & time: 09/03/24  1131     Patient presents with: Testicle Pain   Scott Hubbard is a 68 y.o. male.   Patient is a 68 year old male who presents to the emergency department the chief complaint of pain to the left testicle which has been ongoing since approximately 3 PM yesterday.  Patient notes that he has had no recent falls or blunt testicular trauma.  He denies any pain to his abdomen or flank.  He notes he did have 1 bout of hematuria yesterday.  He does note that he was recently treated for urinary tract infection but is not on antibiotics at this time.  He denies any associated fever, chills, chest pain, shortness of breath.  He denies any history of similar symptoms in the past.   Testicle Pain       Prior to Admission medications   Medication Sig Start Date End Date Taking? Authorizing Provider  HYDROcodone -acetaminophen  (NORCO/VICODIN) 5-325 MG tablet Take 1 tablet by mouth every 6 (six) hours as needed for severe pain (pain score 7-10). 09/03/24  Yes Scott Bruckner D, Scott Hubbard  levofloxacin  (LEVAQUIN ) 250 MG tablet Take 1 tablet (250 mg total) by mouth daily for 10 days. Start taking on 09/04/24 09/03/24 09/13/24 Yes Carlyon Nolasco, Bruckner D, Scott Hubbard  albuterol  (PROVENTIL ) (2.5 MG/3ML) 0.083% nebulizer solution Take 3 mLs (2.5 mg total) by nebulization every 4 (four) hours as needed for wheezing or shortness of breath. 10/06/23   Darlean Ozell NOVAK, MD  albuterol  (VENTOLIN  HFA) 108 [phone] Base) MCG/ACT inhaler USE 2 PUFFS EVERY 4 HOURS AS NEEDED ONLY IF YOUR CAN'T CATCH YOUR BREATH 04/30/24   Darlean Ozell NOVAK, MD  beclomethasone (QVAR  REDIHALER) 80 MCG/ACT inhaler Take 2 puffs first thing in am and then another 2 puffs about 12 hours later. 04/12/24   Darlean Ozell NOVAK, MD  cyclobenzaprine  (FLEXERIL ) 10 MG tablet TAKE 1 TABLET BY MOUTH TWICE A DAY AS NEEDED FOR MUSCLE SPASMS 04/12/24   Tobie Suzzane POUR, MD  Ensifentrine  (OHTUVAYRE ) 3 MG/2.5ML SUSP Inhale 2.5 mLs into the lungs 2 (two) times daily. 04/12/24   Darlean Ozell NOVAK, MD  famotidine  (PEPCID ) 20 MG tablet TAKE 1 TABLET (20 MG TOTAL) BY MOUTH DAILY AFTER SUPPER. 04/24/24   Darlean Ozell NOVAK, MD  pantoprazole  (PROTONIX ) 40 MG tablet TAKE 1 TABLET (40 MG TOTAL) BY MOUTH DAILY. TAKE 30-60 MIN BEFORE FIRST MEAL OF THE DAY 04/30/24   Darlean Ozell NOVAK, MD  predniSONE  (DELTASONE ) 10 MG tablet TAKE 2 TABLETS WITH BREAKFAST UNTIL BETTER THEN 1 DAILY X 5 DAYS AND STOP 08/30/24   Darlean Ozell NOVAK, MD  rOPINIRole  (REQUIP ) 1 MG tablet Take 1 tablet (1 mg total) by mouth at bedtime. 04/12/24   Tobie Suzzane POUR, MD  Tiotropium Bromide-Olodaterol (STIOLTO RESPIMAT ) 2.5-2.5 MCG/ACT AERS INHALE 2 PUFFS BY MOUTH INTO THE LUNGS DAILY 03/13/24   Wert, Michael B, MD  valsartan -hydrochlorothiazide  (DIOVAN -HCT) 80-12.5 MG tablet TAKE 1 TABLET BY MOUTH EVERY DAY 07/17/24   Patel, Rutwik K, MD    Allergies: Patient has no known allergies.    Review of Systems  Genitourinary:  Positive for testicular pain.  All other systems reviewed and are negative.   Updated Vital Signs BP 136/84   Pulse 78   Temp 98.3 F (36.8 C) (Oral)   Resp (!) 22   Ht 6' 5 (1.956 m)   Wt 78 kg   SpO2 95%  BMI 20.39 kg/m   Physical Exam Vitals and nursing note reviewed. Exam conducted with a chaperone present.  Constitutional:      General: He is not in acute distress.    Appearance: Normal appearance. He is not ill-appearing.  HENT:     Head: Normocephalic and atraumatic.  Eyes:     Extraocular Movements: Extraocular movements intact.     Conjunctiva/sclera: Conjunctivae normal.     Pupils: Pupils are equal, round, and reactive to light.  Cardiovascular:     Rate and Rhythm: Normal rate and regular rhythm.     Pulses: Normal pulses.     Heart sounds: Normal heart sounds. No murmur heard.    No gallop.  Pulmonary:     Effort: Pulmonary effort is normal. No respiratory  distress.     Breath sounds: Normal breath sounds. No stridor. No wheezing, rhonchi or rales.  Abdominal:     General: Abdomen is flat. Bowel sounds are normal. There is no distension.     Palpations: Abdomen is soft.     Tenderness: There is no abdominal tenderness. There is no guarding.  Genitourinary:    Penis: Normal.      Comments: Tender to palpation noted to the left testicle and along the left epididymis, mild swelling, no overlying erythema, warmth, induration, no perineal erythema Musculoskeletal:        General: Normal range of motion.     Cervical back: Normal range of motion and neck supple. No rigidity or tenderness.  Skin:    General: Skin is warm and dry.  Neurological:     General: No focal deficit present.     Mental Status: He is alert and oriented to person, place, and time. Mental status is at baseline.  Psychiatric:        Mood and Affect: Mood normal.        Behavior: Behavior normal.        Thought Content: Thought content normal.        Judgment: Judgment normal.     (all labs ordered are listed, but only abnormal results are displayed) Labs Reviewed  COMPREHENSIVE METABOLIC PANEL WITH GFR - Abnormal; Notable for the following components:      Result Value   Sodium 133 (*)    Chloride 94 (*)    Glucose, Bld 102 (*)    BUN 29 (*)    Creatinine, Ser 1.72 (*)    Calcium 8.6 (*)    Total Bilirubin 1.6 (*)    GFR, Estimated 42 (*)    All other components within normal limits  CBC WITH DIFFERENTIAL/PLATELET - Abnormal; Notable for the following components:   WBC 14.7 (*)    Hemoglobin 12.6 (*)    Neutro Abs 12.0 (*)    Monocytes Absolute 1.2 (*)    Abs Immature Granulocytes 0.14 (*)    All other components within normal limits  URINALYSIS, ROUTINE W REFLEX MICROSCOPIC - Abnormal; Notable for the following components:   Color, Urine AMBER (*)    APPearance CLOUDY (*)    Hgb urine dipstick SMALL (*)    Protein, ur 30 (*)    Leukocytes,Ua MODERATE (*)     Bacteria, UA MANY (*)    All other components within normal limits  URINE CULTURE    EKG: None  Radiology: US  SCROTUM W/DOPPLER Result Date: 09/03/2024 CLINICAL DATA:  Pain, swelling left testicle EXAM: SCROTAL ULTRASOUND DOPPLER ULTRASOUND OF THE TESTICLES TECHNIQUE: Complete ultrasound examination of the testicles, epididymis, and  other scrotal structures was performed. Color and spectral Doppler ultrasound were also utilized to evaluate blood flow to the testicles. COMPARISON:  None Available. FINDINGS: Right testicle Measurements: 4.2 x 2.4 x 2.5 cm. No suspicious mass or microlithiasis visualized. Left testicle Measurements: There is 4.2 x 2.2 x 2.8 cm. No suspicious mass or microlithiasis visualized. Right epididymis:  Right epididymal head cyst measures 6 mm. Left epididymis: Enlarged and hypervascular suggesting epididymitis. Hydrocele:  Small left hydrocele. Varicocele:  None visualized. Pulsed Doppler interrogation of both testes demonstrates normal low resistance arterial and venous waveforms bilaterally. IMPRESSION: No testicular abnormality. Enlarged, hypervascular left epididymis suggesting epididymitis. Electronically Signed   By: Franky Crease M.D.   On: 09/03/2024 13:25     Procedures   Medications Ordered in the ED  morphine  (PF) 4 MG/ML injection 4 mg (4 mg Intravenous Given 09/03/24 1324)  ondansetron  (ZOFRAN ) injection 4 mg (4 mg Intravenous Given 09/03/24 1324)  levofloxacin  (LEVAQUIN ) tablet 500 mg (500 mg Oral Given 09/03/24 1407)  oxyCODONE -acetaminophen  (PERCOCET/ROXICET) 5-325 MG per tablet 1 tablet (1 tablet Oral Given 09/03/24 1422)                                    Medical Decision Making Amount and/or Complexity of Data Reviewed Labs: ordered. Radiology: ordered.  Risk Prescription drug management.   This patient presents to the ED for concern of testicular pain differential diagnosis includes testicular torsion, epididymitis, urinary tract infection,  pyelonephritis, kidney stone    Additional history obtained:  Additional history obtained from spouse External records from outside source obtained and reviewed including medical records   Lab Tests:  I Ordered, and personally interpreted labs.  The pertinent results include: Mild leukocytosis, mild anemia but at baseline, mild hyponatremia, creatinine at baseline, normal liver function, urinalysis with moderate leukocytes and many bacteria   Imaging Studies ordered:  I ordered imaging studies including testicular ultrasound I independently visualized and interpreted imaging which showed hypervascular left epididymis, no torsion I agree with the radiologist interpretation   Medicines ordered and prescription drug management:  I ordered medication including morphine , Zofran , Percocet, Levaquin  for epididymitis Reevaluation of the patient after these medicines showed that the patient improved I have reviewed the patients home medicines and have made adjustments as needed   Problem List / ED Course:  Patient is doing well at this time and is stable for discharge home.  Vital signs are stable with no indication for sepsis.  Blood work is consistent with previous at this point.  He does have a chronic leukocytosis as well as anemia.  Urinalysis is consistent with urinary tract infection.  Ultrasound does demonstrate epididymitis and this is consistent with physical exam.  Physical exam demonstrates no indication for cellulitis, abscess, involvement of the perineum, Fournier's gangrene.  Do not suspect that admission is warranted at this time.  Will treat patient accordingly with Levaquin .  This has been renally dosed given his chronic kidney disease.  The need for close follow-up with primary care doctor as well as urology on an outpatient basis was discussed.  Strict turn precautions were provided as well for any new or worsening symptoms.  Patient voiced understanding and had no  additional questions.   Social Determinants of Health:  None        Final diagnoses:  Epididymitis    ED Discharge Orders          Ordered  levofloxacin  (LEVAQUIN ) 250 MG tablet  Daily        09/03/24 1433    HYDROcodone -acetaminophen  (NORCO/VICODIN) 5-325 MG tablet  Every 6 hours PRN        09/03/24 1433               Scott Lonni BIRCH, Scott Hubbard 09/03/24 1444    Charlyn Sora, MD 09/05/24 0031

## 2024-09-06 LAB — URINE CULTURE: Culture: >=100000 — AB

## 2024-09-07 ENCOUNTER — Telehealth (HOSPITAL_BASED_OUTPATIENT_CLINIC_OR_DEPARTMENT_OTHER): Payer: Self-pay

## 2024-09-07 NOTE — Telephone Encounter (Signed)
 Post ED Visit - Positive Culture Follow-up  Culture report reviewed by antimicrobial stewardship pharmacist: [facility] [x]  Koren Or, Pharm.D. []  Venetia Gully, Pharm.D., BCPS AQ-ID []  Garrel Crews, Pharm.D., BCPS []  Almarie Lunger, Pharm.D., BCPS []  Ester, [address].D., BCPS, AAHIVP []  Rosaline Bihari, Pharm.D., BCPS, AAHIVP []  Vernell Meier, PharmD, BCPS []  Latanya Hint, PharmD, BCPS []  Donald Medley, PharmD, BCPS []  Rocky Bold, PharmD []  Dorothyann Alert, PharmD, BCPS []  Morene Babe, PharmD  [facility] []  Rosaline Edison, PharmD []  Romona Bliss, PharmD []  Dolphus Roller, PharmD []  Veva Seip, Rph []  Vernell Daunt) Leonce, PharmD []  Eva Allis, PharmD []  Rosaline Millet, PharmD []  Iantha Batch, PharmD []  Arvin Gauss, PharmD []  Wanda Hasting, PharmD []  Ronal Rav, PharmD []  Rocky Slade, PharmD []  Bard Jeans, PharmD   Positive urine culture Treated with Levofloxacin , organism sensitive to the same and no further patient follow-up is required at this time.  Ruth Camelia Elbe 09/07/2024, 11:34 AM

## 2024-09-12 ENCOUNTER — Other Ambulatory Visit: Payer: Self-pay | Admitting: Internal Medicine

## 2024-09-12 DIAGNOSIS — M62838 Other muscle spasm: Secondary | ICD-10-CM

## 2024-09-13 ENCOUNTER — Telehealth: Payer: Self-pay | Admitting: Internal Medicine

## 2024-09-13 ENCOUNTER — Ambulatory Visit: Admitting: Internal Medicine

## 2024-09-13 NOTE — Telephone Encounter (Signed)
 Patient states he has received the new nebulizer (Ohtuvayre ) that Dr. Darlean prescribed and he does not have a computer and does not receive test messages--he does not know what to do next---call back [phone]

## 2024-09-14 NOTE — Telephone Encounter (Signed)
 Called the pt- he states he is unable to talk and will have to call back bc he is driving. Will hold in clinical pool.

## 2024-09-17 DIAGNOSIS — L089 Local infection of the skin and subcutaneous tissue, unspecified: Secondary | ICD-10-CM | POA: Diagnosis not present

## 2024-09-17 NOTE — Telephone Encounter (Signed)
 Atc x1 pt answered then hung up

## 2024-09-18 NOTE — Telephone Encounter (Signed)
 Atc x2 lmtcb - closing due to protocol

## 2024-09-27 ENCOUNTER — Ambulatory Visit: Admitting: Internal Medicine

## 2024-09-28 NOTE — Telephone Encounter (Signed)
 Copied from CRM [id number]. Topic: Clinical - Medical Advice >> Sep 14, 2024  1:43 PM Shanda MATSU wrote: Reason for CRM: Patient returned call made to him, attempted to reach Pulmonary CAL line, but there was no answer, adv patient that message would be sent for a call back to be made to him.  Atc x3

## 2024-10-01 ENCOUNTER — Ambulatory Visit: Admitting: Internal Medicine

## 2024-10-05 ENCOUNTER — Other Ambulatory Visit: Payer: Self-pay | Admitting: Internal Medicine

## 2024-10-05 DIAGNOSIS — G2581 Restless legs syndrome: Secondary | ICD-10-CM

## 2024-10-05 NOTE — Telephone Encounter (Signed)
 Pt requesting refill of Pepcid  - not mentioned in LOV whether to continue or d/c. Please advise, thank you!

## 2024-10-11 ENCOUNTER — Ambulatory Visit (INDEPENDENT_AMBULATORY_CARE_PROVIDER_SITE_OTHER): Admitting: Internal Medicine

## 2024-10-11 ENCOUNTER — Ambulatory Visit: Admitting: Internal Medicine

## 2024-10-11 ENCOUNTER — Encounter: Payer: Self-pay | Admitting: Internal Medicine

## 2024-10-11 VITALS — BP 136/81 | HR 74 | Ht 77.0 in | Wt 174.2 lb

## 2024-10-11 VITALS — BP 136/81 | HR 72 | Ht 77.0 in | Wt 174.2 lb

## 2024-10-11 DIAGNOSIS — N1832 Chronic kidney disease, stage 3b: Secondary | ICD-10-CM | POA: Diagnosis not present

## 2024-10-11 DIAGNOSIS — K219 Gastro-esophageal reflux disease without esophagitis: Secondary | ICD-10-CM

## 2024-10-11 DIAGNOSIS — J42 Unspecified chronic bronchitis: Secondary | ICD-10-CM

## 2024-10-11 DIAGNOSIS — I10 Essential (primary) hypertension: Secondary | ICD-10-CM

## 2024-10-11 DIAGNOSIS — J449 Chronic obstructive pulmonary disease, unspecified: Secondary | ICD-10-CM

## 2024-10-11 DIAGNOSIS — Z0001 Encounter for general adult medical examination with abnormal findings: Secondary | ICD-10-CM | POA: Diagnosis not present

## 2024-10-11 DIAGNOSIS — R0609 Other forms of dyspnea: Secondary | ICD-10-CM | POA: Diagnosis not present

## 2024-10-11 MED ORDER — VALSARTAN 40 MG PO TABS
40.0000 mg | ORAL_TABLET | Freq: Every day | ORAL | 3 refills | Status: AC
Start: 2024-10-11 — End: ?

## 2024-10-11 NOTE — Assessment & Plan Note (Addendum)
 Quit smoking 2017  - Spirometry  01/04/17  FEV1 0.99 (23%)  Ratio 0.28 p ? Prior  = Allergy profile 04/09/20  >  Eos 0. 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 04/09/2020  MM   Level 156  - 04/09/2020 rec pred 20 until better then 10 mg daily x one week wean - 05/09/2020  After extensive coaching inhaler device,  effectiveness =    90% > rec  continue flovent  220/ stiolto and completely  taper off prednisone   - PFT's  06/24/20  FEV1 1.15 (26 % ) ratio 0.27  p 35 % improvement from saba p stiolto prior to study with DLCO  11.45 (35%) corrects to 1.88 (46%)  for alv volume and FV curve classic curvature  - 09/08/2020  try breztri   - 10/09/2020  After extensive coaching inhaler device,  effectiveness =    90% with smi > change back to stiolto and pred ceiling 20/floor 10 and added protonix  40 mg q am ac as pseudowheeze on exam - 11/06/2020 added pm doses of pepcid / gerd recs including bed blocks   - .11/06/2020 pred floor of 10 mg / ceiling of 20 mg -  11/06/2020   Walked RA  approx   250 ft  @ avg pace  stopped due to  Sob/ fatigue with sats still 96%  Typical of a pink puffer  -  12/15/2020  After extensive coaching inhaler device,  effectiveness =    90%  -  01/28/2021   Walked RA  approx   400 ft  @ slow to moderate pace  stopped due to  Sob  With sats 94% - 05/05/2021 resumed prednisone   D  2 a day until better then 1 a day x 5 days and stop. - 06/09/2022  continue stiolto  Plus flovent  220 2bid  - 09/01/2022  After extensive coaching inhaler device,  effectiveness =    90% with smi, 50% with hfa  - 09/01/2022 added back Prednisone  as plan D = 6 day taper  - 09/01/2022   Walked on RA  x  1  lap(s) =  approx 150  ft  @ moderate pace, stopped due to sob with lowest 02 sats 95%   - 09/29/2022 refused walk  - 09/08/2023  After extensive coaching inhaler device,  effectiveness =    75% smi/ doe not have ICS rx  called in as qvar  in June 2024 - 09/08/2023   Walked on RA  x  2  lap(s) =  approx 300  ft  @ slow but  steady pace, stopped due to tired > sob  with lowest 02 sats 90% > declined rehab (again)   -  01/05/2024  After extensive coaching inhaler device,  effectiveness =    80% with smi  - 01/05/2024  Prednisone  as planD = 20 mg until better then wean off  - 04/12/2024 worse since lost meds in fire with poor hfa technique > trial of ohtuvayre  plus continue stiolto/qvar  80   >>>   improved doe now off qvar   /  still excessive post exertion saba use 10/11/2024 > no change rx except    Again advised re saba:   I spent extra time with pt today reviewing appropriate use of albuterol  for prn use on exertion with the following points: 1) saba is for relief of sob that does not improve by walking a slower pace or resting but rather if the pt does not improve after trying  this first. 2) If the pt is convinced, as many are, that saba helps recover from activity faster then it's easy to tell if this is the case by re-challenging : ie stop, take the inhaler, then p 5 minutes try the exact same activity (intensity of workload) that just caused the symptoms and see if they are substantially diminished or not after saba 3) if there is an activity that reproducibly causes the symptoms, try the saba 15 min before the activity on alternate days   If in fact the saba really does help, then fine to continue to use it prn but advised may need to look closer at the maintenance regimen being used to achieve better control of airways disease with exertion.   F/u q 6 m, sooner prn         Each maintenance medication was reviewed in detail including emphasizing most importantly the difference between maintenance and prns and under what circumstances the prns are to be triggered using an action plan format where appropriate.  Total time for H and P, chart review, counseling, reviewing hfa/ smi/ neb device(s) and generating customized AVS unique to this office visit / same day charting = 

## 2024-10-11 NOTE — Patient Instructions (Addendum)
No change in medications   Also  Ok to try albuterol 15 min before an activity (on alternating days)  that you know would usually make you short of breath and see if it makes any difference and if makes none then don't take albuterol after activity unless you can't catch your breath as this means it's the resting that helps, not the albuterol.     Please schedule a follow up visit in 6  months but call sooner if needed

## 2024-10-11 NOTE — Patient Instructions (Signed)
 Please start taking Valsartan  40 mg once daily instead of Valsartan -HCTZ.  Please follow low salt diet and ambulate as tolerated.  Please cut down soft drinks and maintain at least 64 ounces of fluid intake in a day.
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
## 2024-10-12 NOTE — Assessment & Plan Note (Addendum)
 Quit smoking in 2017 On Stiolto Has Ohtuvayre  neb, Albuterol  nebs and inhaler PRN Has oral steroids for acute exacerbations Follows up with Dr Darlean  Needs low-dose CT chest for lung cancer screening, but he wants to think about it. Also denies flu and pneumococcal vaccine.

## 2024-10-12 NOTE — Progress Notes (Signed)
 Established Patient Office Visit  Subjective:  Patient ID: Scott Hubbard, male    DOB: 12/29/1955  Age: 68 y.o. MRN: [id number]  CC:  Chief Complaint  Patient presents with   Annual Exam    HPI Scott Hubbard is a 68 y.o. male with past medical history of COPD, hypertension, CKD and previous tobacco abuse who presents for follow-up of his chronic medical conditions.  HTN: BP is well-controlled. He has stopped taking Valsartan -HCTZ 80-12.5 mg QD for the last 3 weeks. Patient denies headache, dizziness, or palpitations.  COPD: He complains of chronic cough and dyspnea, for which he uses Stiolto and as needed albuterol  neb and inhaler.  Followed by Dr. Darlean.  He has oral steroids for acute exacerbations.   CKD: His S. Cr. and GFR was better in the last BMP.  He currently denies any dysuria or hematuria.  He reports that he has reduced drinking water  and instead has been taking diet pepsi. After repeated discussions, he still does not cut down on soft drinks.   He used to have chronic leg cramps and forearm cramps, for which he was taking Flexeril . He has stopped taking ropinirole  1 mg and Flexeril  now as he reports resolution of muscle cramps.  Past Medical History:  Diagnosis Date   Chest discomfort    negative stress echocardiogram-01/2011   Chronic bronchitis    CKD (chronic kidney disease)    COPD (chronic obstructive pulmonary disease) (HCC)    DJD (degenerative joint disease)    Gastro-esophageal reflux disease with esophagitis    GERD (gastroesophageal reflux disease)    Hyperlipidemia    Hypertension    Migraine    PVC (premature ventricular contraction)    Frequent   Tobacco abuse    40 pack years    Past Surgical History:  Procedure Laterality Date   COLONOSCOPY WITH PROPOFOL  N/A 02/04/2023   Cindie Carlin POUR, DO; inadequate prep   COLONOSCOPY WITH PROPOFOL  N/A 04/29/2023   Procedure: COLONOSCOPY WITH PROPOFOL ;  Surgeon: Cindie Carlin POUR, DO;  Location: [facility];  Service: Endoscopy;  Laterality: N/A;  1:15 pm, ASA 3   POLYPECTOMY  04/29/2023   Procedure: POLYPECTOMY;  Surgeon: Cindie Carlin POUR, DO;  Location: [facility];  Service: Endoscopy;;    Family History  Problem Relation Age of Onset   Colon cancer Neg Hx     Social History   Socioeconomic History   Marital status: Legally Separated    Spouse name: Not on file   Number of children: 2   Years of education: Not on file   Highest education level: Not on file  Occupational History   Occupation: unemployed    Employer: UNEMPLOYED  Tobacco Use   Smoking status: Former    Current packs/day: 0.00    Average packs/day: 1.5 packs/day for 40.0 years (60.0 ttl pk-yrs)    Types: Cigarettes    Start date: 04/27/1976    Quit date: 04/27/2016    Years since quitting: 8.4   Smokeless tobacco: Never  Vaping Use   Vaping status: Never Used  Substance and Sexual Activity   Alcohol use: No   Drug use: No   Sexual activity: Not on file  Other Topics Concern   Not on file  Social History Narrative   Lives with girlfriend Ronal Creed  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     Social Drivers of Corporate investment banker Strain: Low Risk  (04/12/2023)   Overall Financial Resource  Strain (CARDIA)    Difficulty of Paying Living Expenses: Not hard at all  Food Insecurity: No Food Insecurity (09/03/2024)   [facility] from [facility]   Hunger Vital Sign    Within the past 12 months, you worried that your food would run out before you got the money to buy more.: Never true    Within the past 12 months, the food you bought just didn't last and you didn't have money to get more.: Never true  Transportation Needs: No Transportation Needs (09/03/2024)   Received from [facility]   PRAPARE - Transportation    Lack of Transportation (Medical): No    Lack of Transportation (Non-Medical): No  Physical Activity: Sufficiently Active (09/06/2022)   Exercise Vital Sign    Days of Exercise per Week:  5 days    Minutes of Exercise per Session: 30 min  Stress: No Stress Concern Present (04/12/2023)   [employer] of Occupational Health - Occupational Stress Questionnaire    Feeling of Stress : Only a little  Social Connections: Moderately Integrated (09/06/2022)   Social Connection and Isolation Panel    Frequency of Communication with Friends and Family: More than three times a week    Frequency of Social Gatherings with Friends and Family: Twice a week    Attends Religious Services: 1 to 4 times per year    Active Member of [employer] or Organizations: No    Attends Banker Meetings: Never    Marital Status: Living with partner  Intimate Partner Violence: Not At Risk (09/06/2022)   Humiliation, Afraid, Rape, and Kick questionnaire    Fear of Current or Ex-Partner: No    Emotionally Abused: No    Physically Abused: No    Sexually Abused: No    Outpatient Medications Prior to Visit  Medication Sig Dispense Refill   albuterol  (PROVENTIL ) (2.5 MG/3ML) 0.083% nebulizer solution Take 3 mLs (2.5 mg total) by nebulization every 4 (four) hours as needed for wheezing or shortness of breath. 75 mL 12   albuterol  (VENTOLIN  HFA) 108 (90 Base) MCG/ACT inhaler USE 2 PUFFS EVERY 4 HOURS AS NEEDED ONLY IF YOUR CAN'T CATCH YOUR BREATH 18 each 6   beclomethasone (QVAR  REDIHALER) 80 MCG/ACT inhaler Take 2 puffs first thing in am and then another 2 puffs about 12 hours later.     Ensifentrine  (OHTUVAYRE ) 3 MG/2.5ML SUSP Inhale 2.5 mLs into the lungs 2 (two) times daily.     famotidine  (PEPCID ) 20 MG tablet TAKE 1 TABLET (20 MG TOTAL) BY MOUTH DAILY AFTER SUPPER. 90 tablet 1   HYDROcodone -acetaminophen  (NORCO/VICODIN) 5-325 MG tablet Take 1 tablet by mouth every 6 (six) hours as needed for severe pain (pain score 7-10). 10 tablet 0   pantoprazole  (PROTONIX ) 40 MG tablet TAKE 1 TABLET (40 MG TOTAL) BY MOUTH DAILY. TAKE 30-60 MIN BEFORE FIRST MEAL OF THE DAY 90 tablet 3   predniSONE  (DELTASONE )  10 MG tablet TAKE 2 TABLETS WITH BREAKFAST UNTIL BETTER THEN 1 DAILY X 5 DAYS AND STOP 100 tablet 1   Tiotropium Bromide-Olodaterol (STIOLTO RESPIMAT ) 2.5-2.5 MCG/ACT AERS INHALE 2 PUFFS BY MOUTH INTO THE LUNGS DAILY 4 g 11   cyclobenzaprine  (FLEXERIL ) 10 MG tablet TAKE 1 TABLET BY MOUTH TWICE A DAY AS NEEDED FOR MUSCLE SPASM 60 tablet 3   rOPINIRole  (REQUIP ) 1 MG tablet TAKE 1 TABLET BY MOUTH AT BEDTIME. 90 tablet 1   valsartan -hydrochlorothiazide  (DIOVAN -HCT) 80-12.5 MG tablet TAKE 1 TABLET BY MOUTH EVERY DAY 90  tablet 3   No facility-administered medications prior to visit.    No Known Allergies  ROS Review of Systems  Constitutional:  Negative for chills and fever.  HENT:  Negative for sinus pressure, sinus pain and sore throat.   Eyes:  Negative for pain and discharge.  Respiratory:  Positive for cough, shortness of breath (chronic) and wheezing.   Cardiovascular:  Negative for palpitations and leg swelling.  Gastrointestinal:  Negative for constipation, diarrhea, nausea and vomiting.  Endocrine: Negative for polydipsia and polyuria.  Genitourinary:  Negative for dysuria and hematuria.  Musculoskeletal:  Negative for neck pain and neck stiffness.  Skin:  Positive for rash (b/l UE).  Neurological:  Negative for dizziness, weakness, numbness and headaches.  Psychiatric/Behavioral:  Negative for agitation and behavioral problems.       Objective:    Physical Exam Vitals reviewed.  Constitutional:      General: He is not in acute distress.    Appearance: He is not diaphoretic.  HENT:     Head: Normocephalic and atraumatic.     Mouth/Throat:     Mouth: Mucous membranes are moist.  Eyes:     General: No scleral icterus.    Extraocular Movements: Extraocular movements intact.  Cardiovascular:     Rate and Rhythm: Normal rate and regular rhythm.     Heart sounds: Normal heart sounds. No murmur heard. Pulmonary:     Breath sounds: No wheezing or rales.  Abdominal:      General: Bowel sounds are normal.     Palpations: Abdomen is soft.     Tenderness: There is no abdominal tenderness.     Hernia: A hernia (Umbilical) is present.  Musculoskeletal:        General: No swelling or signs of injury.     Cervical back: Neck supple. No tenderness.     Left hip: Tenderness present. Normal range of motion.     Right lower leg: No edema.     Left lower leg: No edema.     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     Findings: Rash (Erythematous plaques over b/l UE) present.     Comments: Herpetic wart over left palm About 8 cm in diameter lipoma in right axillary area  Neurological:     General: No focal deficit present.     Mental Status: He is alert and oriented to person, place, and time.     Sensory: No sensory deficit.     Motor: No weakness.  Psychiatric:        Mood and Affect: Mood normal.        Behavior: Behavior normal.     BP 136/81   Pulse 74   Ht 6' 5 (1.956 m)   Wt 174 lb 3.2 oz (79 kg)   SpO2 97%   BMI 20.66 kg/m  Wt Readings from Last 3 Encounters:  10/11/24 174 lb 3.2 oz (79 kg)  10/11/24 174 lb 3.2 oz (79 kg)  09/03/24 171 lb 15.3 oz (78 kg)    Lab Results  Component Value Date   TSH 1.420 01/05/2024   Lab Results  Component Value Date   WBC 14.7 (H) 09/03/2024   HGB 12.6 (L) 09/03/2024   HCT 40.4 09/03/2024   MCV 92.2 09/03/2024   PLT 227 09/03/2024   Lab Results  Component Value Date   NA 133 (L) 09/03/2024   K 4.0  09/03/2024   CO2 24 09/03/2024   GLUCOSE 102 (H) 09/03/2024   BUN 29 (H) 09/03/2024   CREATININE 1.72 (H) 09/03/2024   BILITOT 1.6 (H) 09/03/2024   ALKPHOS 64 09/03/2024   AST 17 09/03/2024   ALT 9 09/03/2024   PROT 7.0 09/03/2024   ALBUMIN 3.5 09/03/2024   CALCIUM 8.6 (L) 09/03/2024   ANIONGAP 15 09/03/2024   EGFR 38 (L) 04/12/2024   Lab Results  Component Value Date   CHOL 195 01/05/2024   Lab Results  Component Value Date    HDL 54 01/05/2024   Lab Results  Component Value Date   LDLCALC 118 (H) 01/05/2024   Lab Results  Component Value Date   TRIG 127 01/05/2024   Lab Results  Component Value Date   CHOLHDL 3.6 01/05/2024   Lab Results  Component Value Date   HGBA1C 5.9 (H) 01/05/2024      Assessment & Plan:   Problem List Items Addressed This Visit       Cardiovascular and Mediastinum   Essential hypertension   BP Readings from Last 1 Encounters:  10/11/24 136/81   Well-controlled currently Discontinue Valsartan -HCTZ 80-12.5 mg, started valsartan  40 mg QD instead for underlying HTN and CKD Counseled for compliance with the medications Advised DASH diet and moderate exercise/walking as tolerated      Relevant Medications   valsartan  (DIOVAN ) 40 MG tablet     Respiratory   Chronic bronchitis (HCC)   Quit smoking in 2017 On Stiolto Has Ohtuvayre  neb, Albuterol  nebs and inhaler PRN Has oral steroids for acute exacerbations Follows up with Dr Darlean  Needs low-dose CT chest for lung cancer screening, but he wants to think about it. Also denies flu and pneumococcal vaccine.        Digestive   GERD (gastroesophageal reflux disease)   On Pantoprazole  40 mg QD Partly contributed by chronic steroid therapy        Genitourinary   Stage 3b chronic kidney disease (HCC) - Primary   AKI on CKD in the past, needs to increase water  intake and cut down soft drinks On ARB, DC HCTZ Avoid nephrotoxic agents Reviewed US  renal from ER Checked BMP from chart, stable GFR - recheck BMP after 6 months      Relevant Medications   valsartan  (DIOVAN ) 40 MG tablet     Other   Encounter for general adult medical examination with abnormal findings   Physical exam as documented. Fasting blood tests reviewed today. Refused Pneumococcal vaccine.       Meds ordered this encounter  Medications   valsartan  (DIOVAN ) 40 MG tablet    Sig: Take 1 tablet (40 mg total) by mouth daily.    Dispense:   90 tablet    Refill:  3    Please discontinue Valsartan -hydrochlorothiazide .    Follow-up: Return in about 6 months (around 04/11/2025) for CKD.    Suzzane MARLA Blanch, MD

## 2024-10-12 NOTE — Assessment & Plan Note (Signed)
 Physical exam as documented. Fasting blood tests reviewed today. Refused Pneumococcal vaccine.

## 2024-10-12 NOTE — Assessment & Plan Note (Signed)
 BP Readings from Last 1 Encounters:  10/11/24 136/81   Well-controlled currently Discontinue Valsartan -HCTZ 80-12.5 mg, started valsartan  40 mg QD instead for underlying HTN and CKD Counseled for compliance with the medications Advised DASH diet and moderate exercise/walking as tolerated

## 2024-10-12 NOTE — Assessment & Plan Note (Signed)
On Pantoprazole 40 mg QD Partly contributed by chronic steroid therapy

## 2024-10-12 NOTE — Assessment & Plan Note (Addendum)
 AKI on CKD in the past, needs to increase water  intake and cut down soft drinks On ARB, DC HCTZ Avoid nephrotoxic agents Reviewed US  renal from ER Checked BMP from chart, stable GFR - recheck BMP after 6 months

## 2024-11-14 ENCOUNTER — Ambulatory Visit: Admitting: Urology

## 2024-11-14 DIAGNOSIS — N451 Epididymitis: Secondary | ICD-10-CM

## 2024-11-20 ENCOUNTER — Ambulatory Visit

## 2024-11-20 VITALS — Ht 77.0 in | Wt 175.0 lb

## 2024-11-20 DIAGNOSIS — Z0001 Encounter for general adult medical examination with abnormal findings: Secondary | ICD-10-CM | POA: Diagnosis not present

## 2024-11-20 DIAGNOSIS — F17210 Nicotine dependence, cigarettes, uncomplicated: Secondary | ICD-10-CM

## 2024-11-20 DIAGNOSIS — Z0000 Encounter for general adult medical examination without abnormal findings: Secondary | ICD-10-CM

## 2024-11-20 NOTE — Patient Instructions (Signed)
 Mr. Daft,  Thank you for taking the time for your Medicare Wellness Visit. I appreciate your continued commitment to your health goals. Please review the care plan we discussed, and feel free to reach out if I can assist you further.  Please note that Annual Wellness Visits do not include a physical exam. Some assessments may be limited, especially if the visit was conducted virtually. If needed, we may recommend an in-person follow-up with your provider.  Ongoing Care Seeing your primary care provider every 3 to 6 months helps us  monitor your health and provide consistent, personalized care.   Annual Medicare Wellness Follow Up: 1 year on November 22, 2025 at 1:50 pm  Referrals If a referral was made during today's visit and you haven't received any updates within two weeks, please contact the referred provider directly to check on the status.  Recommended Screenings:  Health Maintenance  Topic Date Due   Pneumococcal Vaccine for age over 52 (1 of 2 - PCV) Never done   Screening for Lung Cancer  Never done   Zoster (Shingles) Vaccine (1 of 2) Never done   Flu Shot  Never done   COVID-19 Vaccine (7 - 2025-26 season) 08/20/2024   Medicare Annual Wellness Visit  11/20/2025   DTaP/Tdap/Td vaccine (4 - Td or Tdap) 10/03/2028   Colon Cancer Screening  04/28/2033   Hepatitis C Screening  Completed   Meningitis B Vaccine  Aged Out       11/20/2024    3:36 PM  Advanced Directives  Does Patient Have a Medical Advance Directive? No  Would patient like information on creating a medical advance directive? No - Patient declined    Vision: Annual vision screenings are recommended for early detection of glaucoma, cataracts, and diabetic retinopathy. These exams can also reveal signs of chronic conditions such as diabetes and high blood pressure.  Dental: Annual dental screenings help detect early signs of oral cancer, gum disease, and other conditions linked to overall health, including heart  disease and diabetes.  Please see the attached documents for additional preventive care recommendations.

## 2024-11-20 NOTE — Progress Notes (Signed)
 Chief Complaint  Patient presents with   Medicare Wellness     Subjective:   Scott Hubbard is a 68 y.o. male who presents for a Medicare Annual Wellness Visit.  Visit info / Clinical Intake: Medicare Wellness Visit Type:: Subsequent Annual Wellness Visit Persons participating in visit and providing information:: patient Medicare Wellness Visit Mode:: Telephone If telephone:: video declined Since this visit was completed virtually, some vitals may be partially provided or unavailable. Missing vitals are due to the limitations of the virtual format.: Documented vitals are patient reported If Telephone or Video please confirm:: I connected with patient using audio/video enable telemedicine. I verified patient identity with two identifiers, discussed telehealth limitations, and patient agreed to proceed. Patient Location:: car Provider Location:: home office Interpreter Needed?: No Pre-visit prep was completed: yes AWV questionnaire completed by patient prior to visit?: no Living arrangements:: lives with spouse/significant other Patient's Overall Health Status Rating: (!) fair Typical amount of pain: none Does pain affect daily life?: no Are you currently prescribed opioids?: no  Dietary Habits and Nutritional Risks How many meals a day?: 2 Eats fruit and vegetables daily?: yes Most meals are obtained by: preparing own meals In the last 2 weeks, have you had any of the following?: none Diabetic:: no  Functional Status Activities of Daily Living (to include ambulation/medication): Independent Ambulation: Independent Medication Administration: Independent Home Management (perform basic housework or laundry): Independent Manage your own finances?: yes Primary transportation is: driving Concerns about vision?: no *vision screening is required for WTM* Concerns about hearing?: no  Fall Screening Falls in the past year?: 1 Number of falls in past year: 1 Was there an injury  with Fall?: 0 Fall Risk Category Calculator: 2 Patient Fall Risk Level: Moderate Fall Risk  Fall Risk Patient at Risk for Falls Due to: History of fall(s); Impaired balance/gait; Impaired mobility Fall risk Follow up: Falls prevention discussed; Education provided; Falls evaluation completed  Home and Transportation Safety: All rugs have non-skid backing?: N/A, no rugs All stairs or steps have railings?: N/A, no stairs Grab bars in the bathtub or shower?: yes Have non-skid surface in bathtub or shower?: yes Good home lighting?: yes Regular seat belt use?: yes Hospital stays in the last year:: no  Cognitive Assessment Difficulty concentrating, remembering, or making decisions? : no Will 6CIT or Mini Cog be Completed: no 6CIT or Mini Cog Declined: patient alert, oriented, able to answer questions appropriately and recall recent events  Advance Directives (For Healthcare) Does Patient Have a Medical Advance Directive?: No Would patient like information on creating a medical advance directive?: No - Patient declined    Allergies (verified) Patient has no known allergies.   Current Medications (verified) Outpatient Encounter Medications as of 11/20/2024  Medication Sig   albuterol  (PROVENTIL ) (2.5 MG/3ML) 0.083% nebulizer solution Take 3 mLs (2.5 mg total) by nebulization every 4 (four) hours as needed for wheezing or shortness of breath.   albuterol  (VENTOLIN  HFA) 108 (90 Base) MCG/ACT inhaler USE 2 PUFFS EVERY 4 HOURS AS NEEDED ONLY IF YOUR CAN'T CATCH YOUR BREATH   Tiotropium Bromide-Olodaterol (STIOLTO RESPIMAT ) 2.5-2.5 MCG/ACT AERS INHALE 2 PUFFS BY MOUTH INTO THE LUNGS DAILY   beclomethasone (QVAR  REDIHALER) 80 MCG/ACT inhaler Take 2 puffs first thing in am and then another 2 puffs about 12 hours later. (Patient not taking: Reported on 11/20/2024)   Ensifentrine  (OHTUVAYRE ) 3 MG/2.5ML SUSP Inhale 2.5 mLs into the lungs 2 (two) times daily.   famotidine  (PEPCID ) 20 MG tablet TAKE  1 TABLET (20 MG TOTAL) BY MOUTH DAILY AFTER SUPPER. (Patient not taking: Reported on 11/20/2024)   HYDROcodone -acetaminophen  (NORCO/VICODIN) 5-325 MG tablet Take 1 tablet by mouth every 6 (six) hours as needed for severe pain (pain score 7-10). (Patient not taking: Reported on 11/20/2024)   pantoprazole  (PROTONIX ) 40 MG tablet TAKE 1 TABLET (40 MG TOTAL) BY MOUTH DAILY. TAKE 30-60 MIN BEFORE FIRST MEAL OF THE DAY (Patient not taking: Reported on 11/20/2024)   predniSONE  (DELTASONE ) 10 MG tablet TAKE 2 TABLETS WITH BREAKFAST UNTIL BETTER THEN 1 DAILY X 5 DAYS AND STOP (Patient not taking: Reported on 11/20/2024)   valsartan  (DIOVAN ) 40 MG tablet Take 1 tablet (40 mg total) by mouth daily. (Patient not taking: Reported on 11/20/2024)   No facility-administered encounter medications on file as of 11/20/2024.    History: Past Medical History:  Diagnosis Date   Chest discomfort    negative stress echocardiogram-01/2011   Chronic bronchitis    CKD (chronic kidney disease)    COPD (chronic obstructive pulmonary disease) (HCC)    DJD (degenerative joint disease)    Gastro-esophageal reflux disease with esophagitis    GERD (gastroesophageal reflux disease)    Hyperlipidemia    Hypertension    Migraine    PVC (premature ventricular contraction)    Frequent   Tobacco abuse    40 pack years   Past Surgical History:  Procedure Laterality Date   COLONOSCOPY WITH PROPOFOL  N/A 02/04/2023   Cindie Carlin POUR, DO; inadequate prep   COLONOSCOPY WITH PROPOFOL  N/A 04/29/2023   Procedure: COLONOSCOPY WITH PROPOFOL ;  Surgeon: Cindie Carlin POUR, DO;  Location: [facility];  Service: Endoscopy;  Laterality: N/A;  1:15 pm, ASA 3   POLYPECTOMY  04/29/2023   Procedure: POLYPECTOMY;  Surgeon: Cindie Carlin POUR, DO;  Location: [facility];  Service: Endoscopy;;   Family History  Problem Relation Age of Onset   Colon cancer Neg Hx    Social History   Occupational History   Occupation: unemployed     Employer: UNEMPLOYED  Tobacco Use   Smoking status: Former    Current packs/day: 0.00    Average packs/day: 1.5 packs/day for 40.0 years (60.0 ttl pk-yrs)    Types: Cigarettes    Start date: 04/27/1976    Quit date: 04/27/2016    Years since quitting: 8.5   Smokeless tobacco: Never  Vaping Use   Vaping status: Never Used  Substance and Sexual Activity   Alcohol use: No   Drug use: No   Sexual activity: Not on file   Tobacco Counseling Counseling given: Yes  SDOH Screenings   Food Insecurity: No Food Insecurity (11/20/2024)  Housing: Low Risk  (11/20/2024)  Transportation Needs: No Transportation Needs (11/20/2024)  Utilities: Not At Risk (11/20/2024)  Alcohol Screen: Low Risk  (09/01/2021)  Depression (PHQ2-9): Low Risk  (11/20/2024)  Financial Resource Strain: Low Risk  (04/12/2023)  Physical Activity: Patient Declined (11/20/2024)  Social Connections: Moderately Isolated (11/20/2024)  Stress: No Stress Concern Present (11/20/2024)  Tobacco Use: Medium Risk (11/20/2024)  Health Literacy: Adequate Health Literacy (11/20/2024)   See flowsheets for full screening details  Depression Screen PHQ 2 & 9 Depression Scale- Over the past 2 weeks, how often have you been bothered by any of the following problems? Little interest or pleasure in doing things: 0 Feeling down, depressed, or hopeless (PHQ Adolescent also includes...irritable): 0 PHQ-2 Total Score: 0 Trouble falling or staying asleep, or sleeping too much: 0 Feeling tired or having little energy:  0 Poor appetite or overeating (PHQ Adolescent also includes...weight loss): 0 Feeling bad about yourself - or that you are a failure or have let yourself or your family down: 0 Trouble concentrating on things, such as reading the newspaper or watching television (PHQ Adolescent also includes...like school work): 0 Moving or speaking so slowly that other people could have noticed. Or the opposite - being so fidgety or restless that you have  been moving around a lot more than usual: 0 Thoughts that you would be better off dead, or of hurting yourself in some way: 0 PHQ-9 Total Score: 0 If you checked off any problems, how difficult have these problems made it for you to do your work, take care of things at home, or get along with other people?: Not difficult at all     Goals Addressed             This Visit's Progress    Patient Stated   On track    Live life.             Objective:    Today's Vitals   11/20/24 1534  Weight: 175 lb (79.4 kg)  Height: 6' 5 (1.956 m)   Body mass index is 20.75 kg/m.  Hearing/Vision screen Hearing Screening - Comments:: Patient denies any hearing difficulties.   Vision Screening - Comments:: Patient wears glasses. Scheduled to establish with a new eye doctor, however, he can't recall her name. Immunizations and Health Maintenance Health Maintenance  Topic Date Due   Pneumococcal Vaccine: 50+ Years (1 of 2 - PCV) Never done   Lung Cancer Screening  Never done   Zoster Vaccines- Shingrix (1 of 2) Never done   Influenza Vaccine  Never done   COVID-19 Vaccine (7 - 2025-26 season) 08/20/2024   Medicare Annual Wellness (AWV)  11/20/2025   DTaP/Tdap/Td (4 - Td or Tdap) 10/03/2028   Colonoscopy  04/28/2033   Hepatitis C Screening  Completed   Meningococcal B Vaccine  Aged Out        Assessment/Plan:  This is a routine wellness examination for Jeramia.  Patient Care Team: Tobie Suzzane POUR, MD as PCP - General (Internal Medicine) Darlean Ozell NOVAK, MD as Consulting Physician (Pulmonary Disease) Cindie Carlin POUR, DO as Consulting Physician (Gastroenterology) Sharl Von HERO, FNP (Nurse Practitioner)  I have personally reviewed and noted the following in the patient's chart:   Medical and social history Use of alcohol, tobacco or illicit drugs  Current medications and supplements including opioid prescriptions. Functional ability and status Nutritional status Physical  activity Advanced directives List of other physicians Hospitalizations, surgeries, and ER visits in previous 12 months Vitals Screenings to include cognitive, depression, and falls Referrals and appointments  Orders Placed This Encounter  Procedures   Ambulatory Referral for Lung Cancer Scre    Referral Priority:   Routine    Referral Type:   Consultation    Referral Reason:   Specialty Services Required    Number of Visits Requested:   1   In addition, I have reviewed and discussed with patient certain preventive protocols, quality metrics, and best practice recommendations. A written personalized care plan for preventive services as well as general preventive health recommendations were provided to patient.   Timothy Townsel, CMA   11/20/2024   Return on November 22, 2025 at 1:50pm, for your yearly Medicare Wellness Visit in person.  After Visit Summary: (Mail) Due to this being a telephonic visit, the after visit summary with patients  personalized plan was offered to patient via mail   Nurse Notes: na

## 2024-11-21 ENCOUNTER — Other Ambulatory Visit: Payer: Self-pay | Admitting: Internal Medicine

## 2024-12-05 ENCOUNTER — Other Ambulatory Visit: Payer: Self-pay | Admitting: Internal Medicine

## 2024-12-05 NOTE — Telephone Encounter (Signed)
 Pt requesting preds Please advise

## 2025-04-11 ENCOUNTER — Ambulatory Visit: Admitting: Internal Medicine

## 2025-11-22 ENCOUNTER — Ambulatory Visit
